# Patient Record
Sex: Female | Born: 1980
Health system: Southern US, Community
[De-identification: ages and names within clinical notes are randomized; demographics above are authoritative.]

## PROBLEM LIST (undated history)

## (undated) ENCOUNTER — Inpatient Hospital Stay (HOSPITAL_COMMUNITY): Payer: Self-pay

## (undated) DIAGNOSIS — IMO0002 Reserved for concepts with insufficient information to code with codable children: Secondary | ICD-10-CM

## (undated) DIAGNOSIS — F419 Anxiety disorder, unspecified: Secondary | ICD-10-CM

## (undated) DIAGNOSIS — M329 Systemic lupus erythematosus, unspecified: Secondary | ICD-10-CM

## (undated) DIAGNOSIS — Z9889 Other specified postprocedural states: Secondary | ICD-10-CM

## (undated) DIAGNOSIS — R112 Nausea with vomiting, unspecified: Secondary | ICD-10-CM

## (undated) DIAGNOSIS — R51 Headache: Secondary | ICD-10-CM

## (undated) HISTORY — DX: Headache: R51

## (undated) HISTORY — PX: KNEE ARTHROSCOPY: SUR90

## (undated) HISTORY — DX: Systemic lupus erythematosus, unspecified: M32.9

## (undated) HISTORY — DX: Anxiety disorder, unspecified: F41.9

## (undated) HISTORY — PX: MANDIBLE SURGERY: SHX707

## (undated) HISTORY — PX: KNEE ARTHROSCOPY W/ OATS PROCEDURE: SHX1880

## (undated) HISTORY — PX: WISDOM TOOTH EXTRACTION: SHX21

---

## 2005-09-09 ENCOUNTER — Emergency Department: Payer: Self-pay | Admitting: Emergency Medicine

## 2006-05-07 ENCOUNTER — Encounter (INDEPENDENT_AMBULATORY_CARE_PROVIDER_SITE_OTHER): Payer: Self-pay | Admitting: Specialist

## 2006-05-07 ENCOUNTER — Ambulatory Visit: Payer: Self-pay | Admitting: Obstetrics & Gynecology

## 2006-06-04 ENCOUNTER — Ambulatory Visit: Payer: Self-pay | Admitting: Obstetrics & Gynecology

## 2006-06-25 ENCOUNTER — Ambulatory Visit: Payer: Self-pay | Admitting: Orthopedic Surgery

## 2006-11-12 ENCOUNTER — Ambulatory Visit: Payer: Self-pay | Admitting: Obstetrics & Gynecology

## 2006-12-16 ENCOUNTER — Ambulatory Visit: Payer: Self-pay | Admitting: Orthopedic Surgery

## 2008-02-03 ENCOUNTER — Encounter: Payer: Self-pay | Admitting: Obstetrics & Gynecology

## 2008-02-03 ENCOUNTER — Ambulatory Visit: Payer: Self-pay | Admitting: Obstetrics & Gynecology

## 2008-02-03 LAB — CONVERTED CEMR LAB
Cholesterol: 176 mg/dL (ref 0–200)
HDL: 45 mg/dL (ref >39)
LDL Cholesterol: 117 mg/dL — ABNORMAL HIGH (ref 0–99)
Total CHOL/HDL Ratio: 3.9
Triglycerides: 70 mg/dL (ref <150)
VLDL: 14 mg/dL (ref 0–40)

## 2009-05-10 ENCOUNTER — Ambulatory Visit: Payer: Self-pay | Admitting: Obstetrics & Gynecology

## 2009-05-11 ENCOUNTER — Encounter: Payer: Self-pay | Admitting: Obstetrics & Gynecology

## 2009-05-11 ENCOUNTER — Ambulatory Visit: Payer: Self-pay | Admitting: Obstetrics and Gynecology

## 2009-05-11 LAB — CONVERTED CEMR LAB
Free T4: 1.17 ng/dL (ref 0.80–1.80)
T3, Free: 3.1 pg/mL (ref 2.3–4.2)
TSH: 4.742 microintl units/mL — ABNORMAL HIGH (ref 0.350–4.500)

## 2009-07-19 ENCOUNTER — Ambulatory Visit: Payer: Self-pay | Admitting: General Practice

## 2009-11-13 ENCOUNTER — Emergency Department: Payer: Self-pay | Admitting: Emergency Medicine

## 2010-07-31 NOTE — Assessment & Plan Note (Signed)
NAME:  DENVER, HARDER NO.:  [id number]   MEDICAL RECORD NO.:  [id number]          PATIENT TYPE:  POB   LOCATION:  [facility] at [facility]         FACILITY:  [facility]   PHYSICIAN:  Catalina Antigua, MD     DATE OF BIRTH:  1980/04/19   DATE OF SERVICE:  05/10/2009                                  CLINIC NOTE   This is a 30 year old, gravida 0 with LMP of April 30, 2009, who  presents for annual exam.  The patient is currently without any  complaints.  Denies abnormal bleeding or discharge or pelvic pain.  Currently in a monogamous relationship using Loestrin for birth control.   PAST MEDICAL HISTORY:  She denies.   PAST SURGICAL HISTORY:  Left knee surgery.   PAST OBSTETRICAL HISTORY:  She is nulliparous.   PAST GYNECOLOGIC HISTORY:  She denies any cysts, fibroids, or abnormal  Pap smears, or history of STDs.   FAMILY HISTORY:  Significant for breast cancer in the first cousin  diagnosed at the age of 50, otherwise denies any significant family  history.   SOCIAL HISTORY:  She denies drinking, smoking, or the use of illicit  drugs.   REVIEW OF SYSTEMS:  Otherwise within normal limits.   PHYSICAL EXAMINATION:  VITAL SIGNS:  Blood pressure of 133/90, pulse of  85, weight of 138 pounds, height of 5 feet 3 inches.  LUNGS:  Were clear to auscultation bilaterally.  HEART:  Regular rate and rhythm.  BREASTS:  Are equal in size.  No palpable masses.  No palpable  lymphadenopathy.  No expressible nipple discharge.  No skin dimpling.  ABDOMEN:  Soft, nontender, and nondistended.  PELVIC:  She had normal-appearing external genitalia, normal-appearing  vaginal mucosa and cervix.  No abnormal bleeding or discharge.  She has  small anteverted uterus.  No palpable adnexal masses or tenderness.   ASSESSMENT AND PLAN:  This is a 30 year old, gravida 0, LMP of  04/30/2009, who is here for annual exam.  Pap smear was performed.  The  patient declined STD testing at this time.   The patient will be  contacted with any abnormal results.  Prescription for Loestrin 24 was  provided.  The patient will return in a year or p.r.n.           ______________________________  Catalina Antigua, MD     PC/MEDQ  D:  05/10/2009  T:  05/10/2009  Job:  [id number]

## 2010-07-31 NOTE — Assessment & Plan Note (Signed)
NAME:  Stephanie [name], ADELSTEIN NO.:  [id number]   MEDICAL RECORD NO.:  [id number]          PATIENT TYPE:  POB   LOCATION:  [facility] at [facility]         FACILITY:  [facility]   PHYSICIAN:  Allie Bossier, MD        DATE OF BIRTH:  1980-08-09   DATE OF SERVICE:  02/03/2008                                  CLINIC NOTE   HISTORY OF PRESENT ILLNESS:  Ms. Elicker is a 30 year old single white  gravida 0 who is here for her annual exam.  She has no complaints, but  wishes a refill of her brand Loestrin OCPs.  She is currently separated  from her boyfriend and is not sexually active at the time that they are  trying to work things out.   PAST MEDICAL HISTORY:  None.   PAST SURGICAL HISTORY:  Left knee arthroscopy.   ALLERGIES:  No latex allergies.  No known drug allergies.   SOCIAL HISTORY:  Negative for tobacco, alcohol, or drug use.   FAMILY HISTORY:  Significant for breast cancer in a paternal first  cousin at the age of 83 years.  She denies a family history of GYN or  colon malignancies.   REVIEW OF SYSTEMS:  She has been monogamous for last 4 years, currently  abstinent.  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  She  denies dyspareunia.  She did say that at work they had a health  screening and her lipids were noted to be elevated.  I will recheck  those today with essentially fasting (she did have a little oatmeals  this morning).   PHYSICAL EXAMINATION:  VITAL SIGNS:  Weight 129 pounds, blood pressure  140/86, pulse 89, and height 5 feet 3 inches.  HEENT:  Normal.  BREAST:  Normal bilaterally.  ABDOMEN:  Scaphoid, no hepatosplenomegaly.  EXTERNAL GENITALIA:  Shaved, no lesions.  CERVIX:  Nulliparous, no lesions.  Normal discharge.  Adnexa nontender.  No masses.   ASSESSMENT AND PLAN:  1. Annual exam.  I will refill her Loestrin for a year.  2. Recommended self-breast exam and self-vulvar exam monthly.  3. I have checked the Pap smear as well.      Allie Bossier, MD     MCD/MEDQ  D:  02/03/2008  T:  02/03/2008  Job:  [id number]

## 2010-12-03 ENCOUNTER — Other Ambulatory Visit (INDEPENDENT_AMBULATORY_CARE_PROVIDER_SITE_OTHER): Payer: Self-pay | Admitting: *Deleted

## 2010-12-03 DIAGNOSIS — N912 Amenorrhea, unspecified: Secondary | ICD-10-CM

## 2010-12-03 NOTE — Progress Notes (Signed)
Patients last period was August 9 or 11. She is not sure.  She is always very regular at 31 day cycles and she had a negative home pregnancy test.  She would like HCG quant done.

## 2010-12-04 LAB — HCG, QUANTITATIVE, PREGNANCY: hCG, Beta Chain, Quant, S: 57023.6 m[IU]/mL

## 2010-12-06 ENCOUNTER — Encounter: Payer: Self-pay | Admitting: Gynecology

## 2010-12-06 ENCOUNTER — Ambulatory Visit: Payer: BC Managed Care – PPO | Admitting: Gynecology

## 2010-12-06 VITALS — BP 109/75 | Wt 136.0 lb

## 2010-12-06 DIAGNOSIS — Z3400 Encounter for supervision of normal first pregnancy, unspecified trimester: Secondary | ICD-10-CM

## 2010-12-07 LAB — OBSTETRIC PANEL
Antibody Screen: NEGATIVE
Basophils Absolute: 0 10*3/uL (ref 0.0–0.1)
Basophils Relative: 0 % (ref 0–1)
Eosinophils Absolute: 0 10*3/uL (ref 0.0–0.7)
Eosinophils Relative: 0 % (ref 0–5)
HCT: 34.9 % — ABNORMAL LOW (ref 36.0–46.0)
Hemoglobin: 12 g/dL (ref 12.0–15.0)
Hepatitis B Surface Ag: NEGATIVE
Lymphocytes Relative: 25 % (ref 12–46)
Lymphs Abs: 1.4 10*3/uL (ref 0.7–4.0)
MCH: 29.5 pg (ref 26.0–34.0)
MCHC: 34.4 g/dL (ref 30.0–36.0)
MCV: 85.7 fL (ref 78.0–100.0)
Monocytes Absolute: 0.4 10*3/uL (ref 0.1–1.0)
Monocytes Relative: 8 % (ref 3–12)
Neutro Abs: 3.7 10*3/uL (ref 1.7–7.7)
Neutrophils Relative %: 67 % (ref 43–77)
Platelets: 163 10*3/uL (ref 150–400)
RBC: 4.07 MIL/uL (ref 3.87–5.11)
RDW: 12.3 % (ref 11.5–15.5)
Rh Type: POSITIVE
Rubella: 34.3 IU/mL — ABNORMAL HIGH
WBC: 5.6 10*3/uL (ref 4.0–10.5)

## 2010-12-07 LAB — TSH: TSH: 3.589 u[IU]/mL (ref 0.350–4.500)

## 2010-12-07 LAB — HEPATITIS B CORE ANTIBODY, IGM: Hep B C IgM: NEGATIVE

## 2010-12-07 LAB — HIV ANTIBODY (ROUTINE TESTING W REFLEX): HIV: NONREACTIVE

## 2010-12-08 LAB — CULTURE, URINE COMPREHENSIVE
Colony Count: NO GROWTH
Organism ID, Bacteria: NO GROWTH

## 2010-12-17 ENCOUNTER — Ambulatory Visit: Payer: Self-pay | Admitting: Obstetrics & Gynecology

## 2010-12-20 ENCOUNTER — Ambulatory Visit (HOSPITAL_COMMUNITY)
Admission: RE | Admit: 2010-12-20 | Discharge: 2010-12-20 | Disposition: A | Discharge status: Home / Self Care | Payer: BC Managed Care – PPO | Source: Ambulatory Visit | Attending: Obstetrics & Gynecology | Admitting: Obstetrics & Gynecology

## 2010-12-20 DIAGNOSIS — Z3400 Encounter for supervision of normal first pregnancy, unspecified trimester: Secondary | ICD-10-CM

## 2010-12-20 DIAGNOSIS — Z3689 Encounter for other specified antenatal screening: Secondary | ICD-10-CM | POA: Insufficient documentation

## 2011-01-03 ENCOUNTER — Other Ambulatory Visit: Payer: Self-pay | Admitting: Obstetrics & Gynecology

## 2011-01-03 ENCOUNTER — Ambulatory Visit (INDEPENDENT_AMBULATORY_CARE_PROVIDER_SITE_OTHER): Payer: BC Managed Care – PPO | Admitting: Obstetrics & Gynecology

## 2011-01-03 ENCOUNTER — Encounter: Payer: Self-pay | Admitting: Obstetrics & Gynecology

## 2011-01-03 VITALS — BP 128/79 | Wt 136.0 lb

## 2011-01-03 DIAGNOSIS — Z3400 Encounter for supervision of normal first pregnancy, unspecified trimester: Secondary | ICD-10-CM

## 2011-01-03 DIAGNOSIS — Z3682 Encounter for antenatal screening for nuchal translucency: Secondary | ICD-10-CM

## 2011-01-03 DIAGNOSIS — Z1272 Encounter for screening for malignant neoplasm of vagina: Secondary | ICD-10-CM

## 2011-01-03 DIAGNOSIS — Z113 Encounter for screening for infections with a predominantly sexual mode of transmission: Secondary | ICD-10-CM

## 2011-01-03 NOTE — Progress Notes (Signed)
Flu vaccine not administered, she will receive when she follows up for her next visit.

## 2011-01-03 NOTE — Progress Notes (Signed)
We discussed recommended weight gain, flu shot, 1st trimester screen and all other pregnancy questions she had.  She denies any problems.

## 2011-01-18 ENCOUNTER — Ambulatory Visit (HOSPITAL_COMMUNITY)
Admission: RE | Admit: 2011-01-18 | Discharge: 2011-01-18 | Disposition: A | Discharge status: Home / Self Care | Payer: BC Managed Care – PPO | Source: Ambulatory Visit | Attending: Obstetrics & Gynecology | Admitting: Obstetrics & Gynecology

## 2011-01-18 ENCOUNTER — Other Ambulatory Visit: Payer: Self-pay

## 2011-01-18 DIAGNOSIS — Z3689 Encounter for other specified antenatal screening: Secondary | ICD-10-CM | POA: Insufficient documentation

## 2011-01-18 DIAGNOSIS — Z3682 Encounter for antenatal screening for nuchal translucency: Secondary | ICD-10-CM

## 2011-01-18 DIAGNOSIS — O351XX0 Maternal care for (suspected) chromosomal abnormality in fetus, not applicable or unspecified: Secondary | ICD-10-CM | POA: Insufficient documentation

## 2011-01-18 DIAGNOSIS — O3510X0 Maternal care for (suspected) chromosomal abnormality in fetus, unspecified, not applicable or unspecified: Secondary | ICD-10-CM | POA: Insufficient documentation

## 2011-01-31 ENCOUNTER — Ambulatory Visit (INDEPENDENT_AMBULATORY_CARE_PROVIDER_SITE_OTHER): Payer: BC Managed Care – PPO | Admitting: Family Medicine

## 2011-01-31 VITALS — BP 121/78 | Wt 137.0 lb

## 2011-01-31 DIAGNOSIS — O0990 Supervision of high risk pregnancy, unspecified, unspecified trimester: Secondary | ICD-10-CM | POA: Insufficient documentation

## 2011-01-31 DIAGNOSIS — Z23 Encounter for immunization: Secondary | ICD-10-CM

## 2011-01-31 DIAGNOSIS — Z3400 Encounter for supervision of normal first pregnancy, unspecified trimester: Secondary | ICD-10-CM

## 2011-01-31 MED ORDER — INFLUENZA VIRUS VACC SPLIT PF IM SUSP: 0.5000 mL | Freq: Once | INTRAMUSCULAR | Status: DC

## 2011-01-31 NOTE — Progress Notes (Signed)
Flu shot today, sched. anatomy

## 2011-01-31 NOTE — Patient Instructions (Signed)
Pregnancy - Second Trimester The second trimester of pregnancy (3 to 6 months) is a period of rapid growth for you and your baby. At the end of the sixth month, your baby is about 9 inches long and weighs 1 1/2 pounds. You will begin to feel the baby move between 18 and 20 weeks of the pregnancy. This is called quickening. Weight gain is faster. A clear fluid (colostrum) may leak out of your breasts. You may feel small contractions of the womb (uterus). This is known as false labor or Braxton-Hicks contractions. This is like a practice for labor when the baby is ready to be born. Usually, the problems with morning sickness have usually passed by the end of your first trimester. Some women develop small dark blotches (called cholasma, mask of pregnancy) on their face that usually goes away after the baby is born. Exposure to the sun makes the blotches worse. Acne may also develop in some pregnant women and pregnant women who have acne, may find that it goes away. PRENATAL EXAMS  Blood work may continue to be done during prenatal exams. These tests are done to check on your health and the probable health of your baby. Blood work is used to follow your blood levels (hemoglobin). Anemia (low hemoglobin) is common during pregnancy. Iron and vitamins are given to help prevent this. You will also be checked for diabetes between 24 and 28 weeks of the pregnancy. Some of the previous blood tests may be repeated.   The size of the uterus is measured during each visit. This is to make sure that the baby is continuing to grow properly according to the dates of the pregnancy.   Your blood pressure is checked every prenatal visit. This is to make sure you are not getting toxemia.   Your urine is checked to make sure you do not have an infection, diabetes or protein in the urine.   Your weight is checked often to make sure gains are happening at the suggested rate. This is to ensure that both you and your baby are  growing normally.   Sometimes, an ultrasound is performed to confirm the proper growth and development of the baby. This is a test which bounces harmless sound waves off the baby so your caregiver can more accurately determine due dates.  Sometimes, a specialized test is done on the amniotic fluid surrounding the baby. This test is called an amniocentesis. The amniotic fluid is obtained by sticking a needle into the belly (abdomen). This is done to check the chromosomes in instances where there is a concern about possible genetic problems with the baby. It is also sometimes done near the end of pregnancy if an early delivery is required. In this case, it is done to help make sure the baby's lungs are mature enough for the baby to live outside of the womb. CHANGES OCCURING IN THE SECOND TRIMESTER OF PREGNANCY Your body goes through many changes during pregnancy. They vary from person to person. Talk to your caregiver about changes you notice that you are concerned about.  During the second trimester, you will likely have an increase in your appetite. It is normal to have cravings for certain foods. This varies from person to person and pregnancy to pregnancy.   Your lower abdomen will begin to bulge.   You may have to urinate more often because the uterus and baby are pressing on your bladder. It is also common to get more bladder infections during pregnancy (  pain with urination). You can help this by drinking lots of fluids and emptying your bladder before and after intercourse.   You may begin to get stretch marks on your hips, abdomen, and breasts. These are normal changes in the body during pregnancy. There are no exercises or medications to take that prevent this change.   You may begin to develop swollen and bulging veins (varicose veins) in your legs. Wearing support hose, elevating your feet for 15 minutes, 3 to 4 times a day and limiting salt in your diet helps lessen the problem.    Heartburn may develop as the uterus grows and pushes up against the stomach. Antacids recommended by your caregiver helps with this problem. Also, eating smaller meals 4 to 5 times a day helps.   Constipation can be treated with a stool softener or adding bulk to your diet. Drinking lots of fluids, vegetables, fruits, and whole grains are helpful.   Exercising is also helpful. If you have been very active up until your pregnancy, most of these activities can be continued during your pregnancy. If you have been less active, it is helpful to start an exercise program such as walking.   Hemorrhoids (varicose veins in the rectum) may develop at the end of the second trimester. Warm sitz baths and hemorrhoid cream recommended by your caregiver helps hemorrhoid problems.   Backaches may develop during this time of your pregnancy. Avoid heavy lifting, wear low heal shoes and practice good posture to help with backache problems.   Some pregnant women develop tingling and numbness of their hand and fingers because of swelling and tightening of ligaments in the wrist (carpel tunnel syndrome). This goes away after the baby is born.   As your breasts enlarge, you may have to get a bigger bra. Get a comfortable, cotton, support bra. Do not get a nursing bra until the last month of the pregnancy if you will be nursing the baby.   You may get a dark line from your belly button to the pubic area called the linea nigra.   You may develop rosy cheeks because of increase blood flow to the face.   You may develop spider looking lines of the face, neck, arms and chest. These go away after the baby is born.  HOME CARE INSTRUCTIONS   It is extremely important to avoid all smoking, herbs, alcohol, and unprescribed drugs during your pregnancy. These chemicals affect the formation and growth of the baby. Avoid these chemicals throughout the pregnancy to ensure the delivery of a healthy infant.   Most of your home  care instructions are the same as suggested for the first trimester of your pregnancy. Keep your caregiver's appointments. Follow your caregiver's instructions regarding medication use, exercise and diet.   During pregnancy, you are providing food for you and your baby. Continue to eat regular, well-balanced meals. Choose foods such as meat, fish, milk and other low fat dairy products, vegetables, fruits, and whole-grain breads and cereals. Your caregiver will tell you of the ideal weight gain.   A physical sexual relationship may be continued up until near the end of pregnancy if there are no other problems. Problems could include early (premature) leaking of amniotic fluid from the membranes, vaginal bleeding, abdominal pain, or other medical or pregnancy problems.   Exercise regularly if there are no restrictions. Check with your caregiver if you are unsure of the safety of some of your exercises. The greatest weight gain will occur in the   last 2 trimesters of pregnancy. Exercise will help you:   Control your weight.   Get you in shape for labor and delivery.   Lose weight after you have the baby.   Wear a good support or jogging bra for breast tenderness during pregnancy. This may help if worn during sleep. Pads or tissues may be used in the bra if you are leaking colostrum.   Do not use hot tubs, steam rooms or saunas throughout the pregnancy.   Wear your seat belt at all times when driving. This protects you and your baby if you are in an accident.   Avoid raw meat, uncooked cheese, cat litter boxes and soil used by cats. These carry germs that can cause birth defects in the baby.   The second trimester is also a good time to visit your dentist for your dental health if this has not been done yet. Getting your teeth cleaned is OK. Use a soft toothbrush. Brush gently during pregnancy.   It is easier to loose urine during pregnancy. Tightening up and strengthening the pelvic muscles will  help with this problem. Practice stopping your urination while you are going to the bathroom. These are the same muscles you need to strengthen. It is also the muscles you would use as if you were trying to stop from passing gas. You can practice tightening these muscles up 10 times a set and repeating this about 3 times per day. Once you know what muscles to tighten up, do not perform these exercises during urination. It is more likely to contribute to an infection by backing up the urine.   Ask for help if you have financial, counseling or nutritional needs during pregnancy. Your caregiver will be able to offer counseling for these needs as well as refer you for other special needs.   Your skin may become oily. If so, wash your face with mild soap, use non-greasy moisturizer and oil or cream based makeup.  MEDICATIONS AND DRUG USE IN PREGNANCY  Take prenatal vitamins as directed. The vitamin should contain 1 milligram of folic acid. Keep all vitamins out of reach of children. Only a couple vitamins or tablets containing iron may be fatal to a baby or young child when ingested.   Avoid use of all medications, including herbs, over-the-counter medications, not prescribed or suggested by your caregiver. Only take over-the-counter or prescription medicines for pain, discomfort, or fever as directed by your caregiver. Do not use aspirin.   Let your caregiver also know about herbs you may be using.   Alcohol is related to a number of birth defects. This includes fetal alcohol syndrome. All alcohol, in any form, should be avoided completely. Smoking will cause low birth rate and premature babies.   Street or illegal drugs are very harmful to the baby. They are absolutely forbidden. A baby born to an addicted mother will be addicted at birth. The baby will go through the same withdrawal an adult does.  SEEK MEDICAL CARE IF:  You have any concerns or worries during your pregnancy. It is better to call with  your questions if you feel they cannot wait, rather than worry about them. SEEK IMMEDIATE MEDICAL CARE IF:   An unexplained oral temperature above 102 F (38.9 C) develops, or as your caregiver suggests.   You have leaking of fluid from the vagina (birth canal). If leaking membranes are suspected, take your temperature and tell your caregiver of this when you call.   There   is vaginal spotting, bleeding, or passing clots. Tell your caregiver of the amount and how many pads are used. Light spotting in pregnancy is common, especially following intercourse.   You develop a bad smelling vaginal discharge with a change in the color from clear to white.   You continue to feel sick to your stomach (nauseated) and have no relief from remedies suggested. You vomit blood or coffee ground-like materials.   You lose more than 2 pounds of weight or gain more than 2 pounds of weight over 1 week, or as suggested by your caregiver.   You notice swelling of your face, hands, feet, or legs.   You get exposed to German measles and have never had them.   You are exposed to fifth disease or chickenpox.   You develop belly (abdominal) pain. Round ligament discomfort is a common non-cancerous (benign) cause of abdominal pain in pregnancy. Your caregiver still must evaluate you.   You develop a bad headache that does not go away.   You develop fever, diarrhea, pain with urination, or shortness of breath.   You develop visual problems, blurry, or double vision.   You fall or are in a car accident or any kind of trauma.   There is mental or physical violence at home.  Document Released: 02/26/2001 Document Revised: 11/14/2010 Document Reviewed: 08/31/2008 Exit[employer] Patient Information 2012 [employer]. 

## 2011-02-27 ENCOUNTER — Ambulatory Visit (INDEPENDENT_AMBULATORY_CARE_PROVIDER_SITE_OTHER): Payer: BC Managed Care – PPO | Admitting: Obstetrics & Gynecology

## 2011-02-27 VITALS — BP 115/76 | Temp 97.8°F | Wt 142.0 lb

## 2011-02-27 DIAGNOSIS — Z3400 Encounter for supervision of normal first pregnancy, unspecified trimester: Secondary | ICD-10-CM

## 2011-02-27 NOTE — Progress Notes (Signed)
Coughing a little, no fever.  Had exposure to colleagues with confirmed flu infection; but she did receive the vaccination.  She was told to call back if her symptoms worsen or if she develops a fever as she may need testing and Tamiflu. MSAFP to be drawn today.  Anatomy scan on 02/28/11.  No other complaints or concerns.  Labor precautions reviewed.   Return to clinic in 4 weeks or earlier if needed.

## 2011-02-27 NOTE — Patient Instructions (Signed)
Breastfeeding BENEFITS OF BREASTFEEDING For the baby  The first milk (colostrum) helps the baby's digestive system function better.   There are antibodies from the mother in the milk that help the baby fight off infections.   The baby has a lower incidence of asthma, allergies, and SIDS (sudden infant death syndrome).   The nutrients in breast milk are better than formulas for the baby and helps the baby's brain grow better.   Babies who breastfeed have less gas, colic, and constipation.  For the mother  Breastfeeding helps develop a very special bond between mother and baby.   It is more convenient, always available at the correct temperature and cheaper than formula feeding.   It burns calories in the mother and helps with losing weight that was gained during pregnancy.   It makes the uterus contract back down to normal size faster and slows bleeding following delivery.   Breastfeeding mothers have a lower risk of developing breast cancer.  NURSE FREQUENTLY  A healthy, full-term baby may breastfeed as often as every hour or space his or her feedings to every 3 hours.   How often to nurse will vary from baby to baby. Watch your baby for signs of hunger, not the clock.   Nurse as often as the baby requests, or when you feel the need to reduce the fullness of your breasts.   Awaken the baby if it has been 3 to 4 hours since the last feeding.   Frequent feeding will help the mother make more milk and will prevent problems like sore nipples and engorgement of the breasts.  BABY'S POSITION AT THE BREAST  Whether lying down or sitting, be sure that the baby's tummy is facing your tummy.   Support the breast with 4 fingers underneath the breast and the thumb above. Make sure your fingers are well away from the nipple and baby's mouth.   Stroke the baby's lips and cheek closest to the breast gently with your finger or nipple.   When the baby's mouth is open wide enough, place  all of your nipple and as much of the dark area around the nipple as possible into your baby's mouth.   Pull the baby in close so the tip of the nose and the baby's cheeks touch the breast during the feeding.  FEEDINGS  The length of each feeding varies from baby to baby and from feeding to feeding.   The baby must suck about 2 to 3 minutes for your milk to get to him or her. This is called a "let down." For this reason, allow the baby to feed on each breast as long as he or she wants. Your baby will end the feeding when he or she has received the right balance of nutrients.   To break the suction, put your finger into the corner of the baby's mouth and slide it between his or her gums before removing your breast from his or her mouth. This will help prevent sore nipples.  REDUCING BREAST ENGORGEMENT  In the first week after your baby is born, you may experience signs of breast engorgement. When breasts are engorged, they feel heavy, warm, full, and may be tender to the touch. You can reduce engorgement if you:   Nurse frequently, every 2 to 3 hours. Mothers who breastfeed early and often have fewer problems with engorgement.   Place light ice packs on your breasts between feedings. This reduces swelling. Wrap the ice packs in a   lightweight towel to protect your skin.   Apply moist hot packs to your breast for 5 to 10 minutes before each feeding. This increases circulation and helps the milk flow.   Gently massage your breast before and during the feeding.   Make sure that the baby empties at least one breast at every feeding before switching sides.   Use a breast pump to empty the breasts if your baby is sleepy or not nursing well. You may also want to pump if you are returning to work or or you feel you are getting engorged.   Avoid bottle feeds, pacifiers or supplemental feedings of water or juice in place of breastfeeding.   Be sure the baby is latched on and positioned properly while  breastfeeding.   Prevent fatigue, stress, and anemia.   Wear a supportive bra, avoiding underwire styles.   Eat a balanced diet with enough fluids.  If you follow these suggestions, your engorgement should improve in 24 to 48 hours. If you are still experiencing difficulty, call your lactation consultant or caregiver. IS MY BABY GETTING ENOUGH MILK? Sometimes, mothers worry about whether their babies are getting enough milk. You can be assured that your baby is getting enough milk if:  The baby is actively sucking and you hear swallowing.   The baby nurses at least 8 to 12 times in a 24 hour time period. Nurse your baby until he or she unlatches or falls asleep at the first breast (at least 10 to 20 minutes), then offer the second side.   The baby is wetting 5 to 6 disposable diapers (6 to 8 cloth diapers) in a 24 hour period by 5 to 6 days of age.   The baby is having at least 2 to 3 stools every 24 hours for the first few months. Breast milk is all the food your baby needs. It is not necessary for your baby to have water or formula. In fact, to help your breasts make more milk, it is best not to give your baby supplemental feedings during the early weeks.   The stool should be soft and yellow.   The baby should gain 4 to 7 ounces per week after he is 4 days old.  TAKE CARE OF YOURSELF Take care of your breasts by:  Bathing or showering daily.   Avoiding the use of soaps on your nipples.   Start feedings on your left breast at one feeding and on your right breast at the next feeding.   You will notice an increase in your milk supply 2 to 5 days after delivery. You may feel some discomfort from engorgement, which makes your breasts very firm and often tender. Engorgement "peaks" out within 24 to 48 hours. In the meantime, apply warm moist towels to your breasts for 5 to 10 minutes before feeding. Gentle massage and expression of some milk before feeding will soften your breasts, making  it easier for your baby to latch on. Wear a well fitting nursing bra and air dry your nipples for 10 to 15 minutes after each feeding.   Only use cotton bra pads.   Only use pure lanolin on your nipples after nursing. You do not need to wash it off before nursing.  Take care of yourself by:   Eating well-balanced meals and nutritious snacks.   Drinking milk, fruit juice, and water to satisfy your thirst (about 8 glasses a day).   Getting plenty of rest.   Increasing calcium in   your diet (1200 mg a day).   Avoiding foods that you notice affect the baby in a bad way.  SEEK MEDICAL CARE IF:   You have any questions or difficulty with breastfeeding.   You need help.   You have a hard, red, sore area on your breast, accompanied by a fever of 100.5 F (38.1 C) or more.   Your baby is too sleepy to eat well or is having trouble sleeping.   Your baby is wetting less than 6 diapers per day, by 5 days of age.   Your baby's skin or white part of his or her eyes is more yellow than it was in the hospital.   You feel depressed.  Document Released: 03/04/2005 Document Revised: 11/14/2010 Document Reviewed: 10/17/2008 [employer] Patient Information 2012 [employer]. 

## 2011-02-28 ENCOUNTER — Ambulatory Visit (HOSPITAL_COMMUNITY)
Admission: RE | Admit: 2011-02-28 | Discharge: 2011-02-28 | Disposition: A | Discharge status: Home / Self Care | Payer: BC Managed Care – PPO | Source: Ambulatory Visit | Attending: Family Medicine | Admitting: Family Medicine

## 2011-02-28 ENCOUNTER — Telehealth: Payer: Self-pay | Admitting: *Deleted

## 2011-02-28 ENCOUNTER — Encounter: Payer: Self-pay | Admitting: Family Medicine

## 2011-02-28 DIAGNOSIS — Z1389 Encounter for screening for other disorder: Secondary | ICD-10-CM | POA: Insufficient documentation

## 2011-02-28 DIAGNOSIS — O358XX0 Maternal care for other (suspected) fetal abnormality and damage, not applicable or unspecified: Secondary | ICD-10-CM | POA: Insufficient documentation

## 2011-02-28 DIAGNOSIS — Z3400 Encounter for supervision of normal first pregnancy, unspecified trimester: Secondary | ICD-10-CM

## 2011-02-28 DIAGNOSIS — Z363 Encounter for antenatal screening for malformations: Secondary | ICD-10-CM | POA: Insufficient documentation

## 2011-02-28 DIAGNOSIS — R935 Abnormal findings on diagnostic imaging of other abdominal regions, including retroperitoneum: Secondary | ICD-10-CM

## 2011-02-28 LAB — ALPHA FETOPROTEIN, MATERNAL
AFP: 28.9 IU/mL
MoM for AFP: 0.63
Open Spina bifida: NEGATIVE
Osb Risk: <1:27300 {titer}

## 2011-02-28 NOTE — Telephone Encounter (Signed)
Patient had ultrasound at radiology today and was told her baby had a soft marker for downs in her heart.  She would like to move forward with mfm consult.  She is considering ultrasound.

## 2011-03-05 ENCOUNTER — Ambulatory Visit (HOSPITAL_COMMUNITY)
Admission: RE | Admit: 2011-03-05 | Discharge: 2011-03-05 | Disposition: A | Discharge status: Home / Self Care | Payer: BC Managed Care – PPO | Source: Ambulatory Visit | Attending: Family Medicine | Admitting: Family Medicine

## 2011-03-05 ENCOUNTER — Other Ambulatory Visit: Payer: Self-pay | Admitting: Obstetrics & Gynecology

## 2011-03-05 ENCOUNTER — Ambulatory Visit (HOSPITAL_COMMUNITY)
Admission: RE | Admit: 2011-03-05 | Discharge: 2011-03-05 | Disposition: A | Discharge status: Home / Self Care | Payer: BC Managed Care – PPO | Source: Ambulatory Visit | Attending: Obstetrics & Gynecology | Admitting: Obstetrics & Gynecology

## 2011-03-05 DIAGNOSIS — O358XX0 Maternal care for other (suspected) fetal abnormality and damage, not applicable or unspecified: Secondary | ICD-10-CM

## 2011-03-05 DIAGNOSIS — Z1389 Encounter for screening for other disorder: Secondary | ICD-10-CM | POA: Insufficient documentation

## 2011-03-05 DIAGNOSIS — Z363 Encounter for antenatal screening for malformations: Secondary | ICD-10-CM | POA: Insufficient documentation

## 2011-03-06 ENCOUNTER — Encounter: Payer: Self-pay | Admitting: Obstetrics & Gynecology

## 2011-03-19 NOTE — L&D Delivery Note (Signed)
Delivery Note At 9:22 AM a viable female was delivered via Vaginal, Spontaneous Delivery (Presentation: ;OP  ).  APGAR: 9, 10; weight .  8.1 Placenta status: Intact, Spontaneous.  Cord: 3 vessels with the following complications PPH 600cc  Anesthesia: Epidural  Episiotomy: None Lacerations: 2nd degree perineal, bilateral sulcus tears; repaired by Dr. Penne Lash Suture Repair: 3.0 vicryl rapide Est. Blood Loss (mL): 600  Mom to postpartum.  Baby to nursery-stable.  CRESENZO-DISHMAN,Aki Burdin 07/25/2011, 10:15 AM

## 2011-03-20 ENCOUNTER — Ambulatory Visit (INDEPENDENT_AMBULATORY_CARE_PROVIDER_SITE_OTHER): Payer: BC Managed Care – PPO | Admitting: Family Medicine

## 2011-03-20 ENCOUNTER — Other Ambulatory Visit: Payer: Self-pay

## 2011-03-20 ENCOUNTER — Inpatient Hospital Stay (HOSPITAL_COMMUNITY)
Admission: AD | Admit: 2011-03-20 | Discharge: 2011-03-20 | Disposition: A | Discharge status: Home / Self Care | Payer: BC Managed Care – PPO | Source: Ambulatory Visit | Attending: Obstetrics and Gynecology | Admitting: Obstetrics and Gynecology

## 2011-03-20 ENCOUNTER — Encounter (HOSPITAL_COMMUNITY): Payer: Self-pay | Admitting: *Deleted

## 2011-03-20 DIAGNOSIS — R42 Dizziness and giddiness: Secondary | ICD-10-CM | POA: Insufficient documentation

## 2011-03-20 DIAGNOSIS — O99891 Other specified diseases and conditions complicating pregnancy: Secondary | ICD-10-CM | POA: Insufficient documentation

## 2011-03-20 DIAGNOSIS — Z3400 Encounter for supervision of normal first pregnancy, unspecified trimester: Secondary | ICD-10-CM

## 2011-03-20 DIAGNOSIS — R55 Syncope and collapse: Secondary | ICD-10-CM

## 2011-03-20 LAB — COMPREHENSIVE METABOLIC PANEL
ALT: 10 U/L (ref 0–35)
AST: 15 U/L (ref 0–37)
Albumin: 2.8 g/dL — ABNORMAL LOW (ref 3.5–5.2)
Alkaline Phosphatase: 51 U/L (ref 39–117)
BUN: 8 mg/dL (ref 6–23)
CO2: 25 mEq/L (ref 19–32)
Calcium: 8.6 mg/dL (ref 8.4–10.5)
Chloride: 104 mEq/L (ref 96–112)
Creatinine, Ser: 0.51 mg/dL (ref 0.50–1.10)
GFR calc Af Amer: >90 mL/min (ref >90)
GFR calc non Af Amer: >90 mL/min (ref >90)
Glucose, Bld: 95 mg/dL (ref 70–99)
Potassium: 3.7 mEq/L (ref 3.5–5.1)
Sodium: 136 mEq/L (ref 135–145)
Total Bilirubin: 0.2 mg/dL — ABNORMAL LOW (ref 0.3–1.2)
Total Protein: 6.5 g/dL (ref 6.0–8.3)

## 2011-03-20 LAB — URINALYSIS, ROUTINE W REFLEX MICROSCOPIC
Bilirubin Urine: NEGATIVE
Glucose, UA: NEGATIVE mg/dL
Hgb urine dipstick: NEGATIVE
Ketones, ur: NEGATIVE mg/dL
Leukocytes, UA: NEGATIVE
Nitrite: NEGATIVE
Protein, ur: NEGATIVE mg/dL
Specific Gravity, Urine: 1.01 (ref 1.005–1.030)
Urobilinogen, UA: 0.2 mg/dL (ref 0.0–1.0)
pH: 6.5 (ref 5.0–8.0)

## 2011-03-20 LAB — CBC
HCT: 34.4 % — ABNORMAL LOW (ref 36.0–46.0)
Hemoglobin: 12.3 g/dL (ref 12.0–15.0)
MCH: 31.6 pg (ref 26.0–34.0)
MCHC: 35.8 g/dL (ref 30.0–36.0)
MCV: 88.4 fL (ref 78.0–100.0)
Platelets: 162 10*3/uL (ref 150–400)
RBC: 3.89 MIL/uL (ref 3.87–5.11)
RDW: 13.3 % (ref 11.5–15.5)
WBC: 7.4 10*3/uL (ref 4.0–10.5)

## 2011-03-20 NOTE — Patient Instructions (Signed)
Syncope You have had a fainting (syncopal) spell. A fainting episode is a sudden, short-lived loss of consciousness. It results in complete recovery. It occurs because there has been a temporary shortage of oxygen and/or sugar (glucose) to the brain. CAUSES   Blood pressure pills and other medications that may lower blood pressure below normal. Sudden changes in posture (sudden standing).   Over-medication. Take your medications as directed.   Standing too long. This can cause blood to pool in the legs.   Seizure disorders.   Low blood sugar (hypoglycemia) of diabetes. This more commonly causes coma.   Bearing down to go to the bathroom. This can cause your blood pressure to rise suddenly. Your body compensates by making the blood pressure too low when you stop bearing down.   Hardening of the arteries where the brain temporarily does not receive enough blood.   Irregular heart beat and circulatory problems.   Fear, emotional distress, injury, sight of blood, or illness.  Your caregiver will send you home if the syncope was from non-worrisome causes (benign). Depending on your age and health, you may stay to be monitored and observed. If you return home, have someone stay with you if your caregiver feels that is desirable. It is very important to keep all follow-up referrals and appointments in order to properly manage this condition. This is a serious problem which can lead to serious illness and death if not carefully managed.  WARNING: Do not drive or operate machinery until your caregiver feels that it is safe for you to do so. SEEK IMMEDIATE MEDICAL CARE IF:   You have another fainting episode or faint while lying or sitting down. DO NOT DRIVE YOURSELF. Call 911 if no other help is available.   You have chest pain, are feeling sick to your stomach (nausea), vomiting or abdominal pain.   You have an irregular heartbeat or one that is very fast (pulse over 120 beats per minute).    You have a loss of feeling in some part of your body or lose movement in your arms or legs.   You have difficulty with speech, confusion, severe weakness, or visual problems.   You become sweaty and/or feel light headed.  Make sure you are rechecked as instructed. Document Released: 03/04/2005 Document Revised: 11/14/2010 Document Reviewed: 10/23/2006 [facility] Patient Information 2012 [place], [place].

## 2011-03-20 NOTE — ED Provider Notes (Signed)
Pt 31 y/o G1P0 21.5 weeks that come today to the MAU with the complaint of lightheadedness. She denies nausea, vomiting, fever or pain. No upper respiratory illness. Had an episode of bronchitis 3 weeks ago that resolved completely. She was concern about baby's health. No bleeding, no vaginal discharge, baby moving. Physical exam. General: cooperative. NAD HEENT: moist mucosa no oropharyngeal erythema. Resp: Clear breath sounds. No rales or wheezes. Card: RRR no murmurs. OB exam: fetal heart rate 146. Fetal mov present. No edema. No discharge. Assessment and Plan: 31 y/o F G1 P0 21.5 w with lightheadedness of unclear etiology Negative physical exam findings. Labs WNL, EKG with normal sinus rhythm. Pt's mother mentions that Ms. May drinks mostly sodas. There is a component of anxiety about the baby. Discussed with pt and agreeable to drink plenty of fluids. We recommend her  to get evaluated if symptoms persist.

## 2011-03-20 NOTE — Progress Notes (Signed)
Near syncope--unclear etiology--not worse with standing, no vertigo, HR is up mildly--will send to MAU for EKG and Labs.

## 2011-03-20 NOTE — Progress Notes (Signed)
Was seen at [facility] today for c/o syncope, sent to MAU, EKG in progress.

## 2011-03-20 NOTE — ED Provider Notes (Signed)
Seen with resident. Agree with assesment and plan

## 2011-03-20 NOTE — ED Provider Notes (Signed)
Agree with above note.  Jozelyn Kuwahara 03/20/2011 4:05 PM

## 2011-03-20 NOTE — Progress Notes (Signed)
Patient has been feeling very light headed and week.  This has been going on but not lasting very long, but today it has been all morning.  She has been eating and drinking well.

## 2011-03-20 NOTE — Progress Notes (Signed)
Patient states she woke up this am feeling lightheaded and weak. Went to the office and was sent to MAU for further evaluation. Patient states she has eaten and drank. The lightheadedness and weak over the whole body continues. Denies pain, bleeding or leaking and reports fetal movement.

## 2011-03-20 NOTE — ED Provider Notes (Signed)
Seen by me. Agree with above.

## 2011-03-21 ENCOUNTER — Telehealth (HOSPITAL_COMMUNITY): Payer: Self-pay | Admitting: MS"

## 2011-03-21 NOTE — Progress Notes (Signed)
BP lying 124/85 BP sitting 121/86 BP standing 121/96

## 2011-03-21 NOTE — Telephone Encounter (Signed)
Called Stephanie [name] May to discuss result of noninvasive prenatal testing ([facility]), which uses cell free fetal DNA found in the maternal circulation. These results are low risk for the conditions screened: less than 1 in 10,000 for trisomy 13, trisomy 3, and trisomy 20.   This test is not diagnostic for chromosome conditions, but can provide information regarding the presence or absence of extra fetal DNA for chromosomes 13, 18 and 21. The reported detection rate is greater than 99% for Trisomy 21, greater than 97% for Trisomy 18, and is approximately 80% (8 out of 10) for Trisomy 13. The false positive rate is thought to be less than 1% for any of these conditions.

## 2011-03-26 ENCOUNTER — Ambulatory Visit (INDEPENDENT_AMBULATORY_CARE_PROVIDER_SITE_OTHER): Payer: BC Managed Care – PPO | Admitting: Family Medicine

## 2011-03-26 DIAGNOSIS — Z3480 Encounter for supervision of other normal pregnancy, unspecified trimester: Secondary | ICD-10-CM

## 2011-03-26 NOTE — Progress Notes (Signed)
Went to [facility] last week for near syncope--work up was WNL. Feels better and is increasing hydration.

## 2011-03-26 NOTE — Patient Instructions (Signed)
Pregnancy - Second Trimester The second trimester of pregnancy (3 to 6 months) is a period of rapid growth for you and your baby. At the end of the sixth month, your baby is about 9 inches long and weighs 1 1/2 pounds. You will begin to feel the baby move between 18 and 20 weeks of the pregnancy. This is called quickening. Weight gain is faster. A clear fluid (colostrum) may leak out of your breasts. You may feel small contractions of the womb (uterus). This is known as false labor or Braxton-Hicks contractions. This is like a practice for labor when the baby is ready to be born. Usually, the problems with morning sickness have usually passed by the end of your first trimester. Some women develop small dark blotches (called cholasma, mask of pregnancy) on their face that usually goes away after the baby is born. Exposure to the sun makes the blotches worse. Acne may also develop in some pregnant women and pregnant women who have acne, may find that it goes away. PRENATAL EXAMS  Blood work may continue to be done during prenatal exams. These tests are done to check on your health and the probable health of your baby. Blood work is used to follow your blood levels (hemoglobin). Anemia (low hemoglobin) is common during pregnancy. Iron and vitamins are given to help prevent this. You will also be checked for diabetes between 24 and 28 weeks of the pregnancy. Some of the previous blood tests may be repeated.   The size of the uterus is measured during each visit. This is to make sure that the baby is continuing to grow properly according to the dates of the pregnancy.   Your blood pressure is checked every prenatal visit. This is to make sure you are not getting toxemia.   Your urine is checked to make sure you do not have an infection, diabetes or protein in the urine.   Your weight is checked often to make sure gains are happening at the suggested rate. This is to ensure that both you and your baby are  growing normally.   Sometimes, an ultrasound is performed to confirm the proper growth and development of the baby. This is a test which bounces harmless sound waves off the baby so your caregiver can more accurately determine due dates.  Sometimes, a specialized test is done on the amniotic fluid surrounding the baby. This test is called an amniocentesis. The amniotic fluid is obtained by sticking a needle into the belly (abdomen). This is done to check the chromosomes in instances where there is a concern about possible genetic problems with the baby. It is also sometimes done near the end of pregnancy if an early delivery is required. In this case, it is done to help make sure the baby's lungs are mature enough for the baby to live outside of the womb. CHANGES OCCURING IN THE SECOND TRIMESTER OF PREGNANCY Your body goes through many changes during pregnancy. They vary from person to person. Talk to your caregiver about changes you notice that you are concerned about.  During the second trimester, you will likely have an increase in your appetite. It is normal to have cravings for certain foods. This varies from person to person and pregnancy to pregnancy.   Your lower abdomen will begin to bulge.   You may have to urinate more often because the uterus and baby are pressing on your bladder. It is also common to get more bladder infections during pregnancy (  pain with urination). You can help this by drinking lots of fluids and emptying your bladder before and after intercourse.   You may begin to get stretch marks on your hips, abdomen, and breasts. These are normal changes in the body during pregnancy. There are no exercises or medications to take that prevent this change.   You may begin to develop swollen and bulging veins (varicose veins) in your legs. Wearing support hose, elevating your feet for 15 minutes, 3 to 4 times a day and limiting salt in your diet helps lessen the problem.    Heartburn may develop as the uterus grows and pushes up against the stomach. Antacids recommended by your caregiver helps with this problem. Also, eating smaller meals 4 to 5 times a day helps.   Constipation can be treated with a stool softener or adding bulk to your diet. Drinking lots of fluids, vegetables, fruits, and whole grains are helpful.   Exercising is also helpful. If you have been very active up until your pregnancy, most of these activities can be continued during your pregnancy. If you have been less active, it is helpful to start an exercise program such as walking.   Hemorrhoids (varicose veins in the rectum) may develop at the end of the second trimester. Warm sitz baths and hemorrhoid cream recommended by your caregiver helps hemorrhoid problems.   Backaches may develop during this time of your pregnancy. Avoid heavy lifting, wear low heal shoes and practice good posture to help with backache problems.   Some pregnant women develop tingling and numbness of their hand and fingers because of swelling and tightening of ligaments in the wrist (carpel tunnel syndrome). This goes away after the baby is born.   As your breasts enlarge, you may have to get a bigger bra. Get a comfortable, cotton, support bra. Do not get a nursing bra until the last month of the pregnancy if you will be nursing the baby.   You may get a dark line from your belly button to the pubic area called the linea nigra.   You may develop rosy cheeks because of increase blood flow to the face.   You may develop spider looking lines of the face, neck, arms and chest. These go away after the baby is born.  HOME CARE INSTRUCTIONS   It is extremely important to avoid all smoking, herbs, alcohol, and unprescribed drugs during your pregnancy. These chemicals affect the formation and growth of the baby. Avoid these chemicals throughout the pregnancy to ensure the delivery of a healthy infant.   Most of your home  care instructions are the same as suggested for the first trimester of your pregnancy. Keep your caregiver's appointments. Follow your caregiver's instructions regarding medication use, exercise and diet.   During pregnancy, you are providing food for you and your baby. Continue to eat regular, well-balanced meals. Choose foods such as meat, fish, milk and other low fat dairy products, vegetables, fruits, and whole-grain breads and cereals. Your caregiver will tell you of the ideal weight gain.   A physical sexual relationship may be continued up until near the end of pregnancy if there are no other problems. Problems could include early (premature) leaking of amniotic fluid from the membranes, vaginal bleeding, abdominal pain, or other medical or pregnancy problems.   Exercise regularly if there are no restrictions. Check with your caregiver if you are unsure of the safety of some of your exercises. The greatest weight gain will occur in the   last 2 trimesters of pregnancy. Exercise will help you:   Control your weight.   Get you in shape for labor and delivery.   Lose weight after you have the baby.   Wear a good support or jogging bra for breast tenderness during pregnancy. This may help if worn during sleep. Pads or tissues may be used in the bra if you are leaking colostrum.   Do not use hot tubs, steam rooms or saunas throughout the pregnancy.   Wear your seat belt at all times when driving. This protects you and your baby if you are in an accident.   Avoid raw meat, uncooked cheese, cat litter boxes and soil used by cats. These carry germs that can cause birth defects in the baby.   The second trimester is also a good time to visit your dentist for your dental health if this has not been done yet. Getting your teeth cleaned is OK. Use a soft toothbrush. Brush gently during pregnancy.   It is easier to loose urine during pregnancy. Tightening up and strengthening the pelvic muscles will  help with this problem. Practice stopping your urination while you are going to the bathroom. These are the same muscles you need to strengthen. It is also the muscles you would use as if you were trying to stop from passing gas. You can practice tightening these muscles up 10 times a set and repeating this about 3 times per day. Once you know what muscles to tighten up, do not perform these exercises during urination. It is more likely to contribute to an infection by backing up the urine.   Ask for help if you have financial, counseling or nutritional needs during pregnancy. Your caregiver will be able to offer counseling for these needs as well as refer you for other special needs.   Your skin may become oily. If so, wash your face with mild soap, use non-greasy moisturizer and oil or cream based makeup.  MEDICATIONS AND DRUG USE IN PREGNANCY  Take prenatal vitamins as directed. The vitamin should contain 1 milligram of folic acid. Keep all vitamins out of reach of children. Only a couple vitamins or tablets containing iron may be fatal to a baby or young child when ingested.   Avoid use of all medications, including herbs, over-the-counter medications, not prescribed or suggested by your caregiver. Only take over-the-counter or prescription medicines for pain, discomfort, or fever as directed by your caregiver. Do not use aspirin.   Let your caregiver also know about herbs you may be using.   Alcohol is related to a number of birth defects. This includes fetal alcohol syndrome. All alcohol, in any form, should be avoided completely. Smoking will cause low birth rate and premature babies.   Street or illegal drugs are very harmful to the baby. They are absolutely forbidden. A baby born to an addicted mother will be addicted at birth. The baby will go through the same withdrawal an adult does.  SEEK MEDICAL CARE IF:  You have any concerns or worries during your pregnancy. It is better to call with  your questions if you feel they cannot wait, rather than worry about them. SEEK IMMEDIATE MEDICAL CARE IF:   An unexplained oral temperature above 102 F (38.9 C) develops, or as your caregiver suggests.   You have leaking of fluid from the vagina (birth canal). If leaking membranes are suspected, take your temperature and tell your caregiver of this when you call.   There   is vaginal spotting, bleeding, or passing clots. Tell your caregiver of the amount and how many pads are used. Light spotting in pregnancy is common, especially following intercourse.   You develop a bad smelling vaginal discharge with a change in the color from clear to white.   You continue to feel sick to your stomach (nauseated) and have no relief from remedies suggested. You vomit blood or coffee ground-like materials.   You lose more than 2 pounds of weight or gain more than 2 pounds of weight over 1 week, or as suggested by your caregiver.   You notice swelling of your face, hands, feet, or legs.   You get exposed to German measles and have never had them.   You are exposed to fifth disease or chickenpox.   You develop belly (abdominal) pain. Round ligament discomfort is a common non-cancerous (benign) cause of abdominal pain in pregnancy. Your caregiver still must evaluate you.   You develop a bad headache that does not go away.   You develop fever, diarrhea, pain with urination, or shortness of breath.   You develop visual problems, blurry, or double vision.   You fall or are in a car accident or any kind of trauma.   There is mental or physical violence at home.  Document Released: 02/26/2001 Document Revised: 11/14/2010 Document Reviewed: 08/31/2008 [employer] Patient Information 2012 [employer]. Birth Control Choices Birth control is the use of any practices, methods, or devices to prevent pregnancy from happening in a sexually active woman.  Below are some birth control choices to help avoid  pregnancy.  Not having sex (abstinence) is the surest form of birth control. This requires self-control. There is no risk of acquiring a sexually transmitted disease (STD), including acquired immunodeficiency syndrome (AIDS).   Periodic abstinence requires self-control during certain times of the month.   Calendar method, timing your menstrual periods from month to month.   Ovulation method is avoiding sexual intercourse around the time you produce an egg (ovulate).   Symptotherm method is avoiding sexual intercourse at the time of ovulation, using a thermometer and ovulation symptoms.   Post ovulation method is the timing of sexual intercourse after you ovulated.  These methods do not protect against STDs, including AIDS.  Birth control pills (BCPs) contain estrogen and progesterone hormone. These medicines work by stopping the egg from forming in the ovary (ovulation). Birth control pills are prescribed by a caregiver who will ask you questions about the risks of taking BCPs. Birth control pills do not protect against STDs, including AIDS.   "Minipill" birth control pills have only the progesterone hormone. They are taken every day of each month and must be prescribed by your caregiver. They do not protect against STDs, including AIDS.   Emergency contraception is often call the "morning after" pill. This pill can be taken right after sex or up to five days after sex if you think your birth control failed, you failed to use contraception, or you were forced to have sex. It is most effective the sooner you take the pills after having sexual intercourse. Do not use emergency contraception as your only form of birth control. Emergency contraceptive pills are available without a prescription. Check with your pharmacist.   Condoms are a thin sheath of latex, synthetic material, or lambskin worn over the penis during sexual intercourse. They can have a spermicide in or on them when you buy them.  Latex condoms can prevent pregnancy and STDs. "Natural" or lambskin   condoms can prevent pregnancy but may not protect against STDs, including AIDS.   Female condoms are a soft, loose-fitting sheath that is put into the vagina before sexual intercourse. They can prevent pregnancy and STDs, including AIDS.   Sponge is a soft, circular piece of polyurethane foam with spermicide in it that is inserted into the vagina after wetting it and before sexual intercourse. It does not require a prescription from your caregiver. It does not protect against STDs, including AIDS.   Diaphragm is a soft, latex, dome-shaped barrier that must be fitted by a caregiver. It is inserted into the vagina, along with a spermicidal jelly. After the proper fitting for a diaphragm, always insert the diaphragm before intercourse. The diaphragm should be left in the vagina for 6 to 8 hours after intercourse. Removal and reinsertion with a spermicide is always necessary after any use. It does not protect against STDs, including AIDS.   Progesterone-only injections are given every 3 months to prevent pregnancy. These injections contain synthetic progesterone and no estrogen. This hormone stops the ovaries from releasing eggs. It also causes the cervical mucus to thicken and changes the uterine lining. This makes it harder for sperm to survive in the uterus. It does not protect against STDs, including AIDS.   Birth Control Patch contains hormones similar to those in birth control pills, so effectiveness, risks, and side effects are similar. It must be changed once a week and is prescribed by a caregiver. It is less effective in very overweight women. It does not protect against STDs, including AIDS.   Vaginal Ring contains hormones similar to those in birth control pills. It is left in place for 3 weeks, removed for 1 week, and then a new one is put back into the vagina. It comes with a timer to put in your purse to help you remember when  to take it out or put a new one in. A caregiver's examination and prescription is necessary, just like with birth control pills and the patch. It does not protect against STDs, including AIDS.   Estrogen plus progesterone injections are given every 28 to 30 days. They can be given in the upper arm, thigh, or buttocks. It does not protect against STDs, including AIDS.   Intrauterine device (IUD): copper T or progestin filled is a T-shaped device that is put in a woman's uterus during a menstrual period to prevent pregnancy. The copper T IUD can last 10 years, and the progestin IUD can last 5 years. The progestin IUD can also help control heavy menstrual periods. It does not protect against STDs, including AIDS. The copper T IUD can be used as emergency contraception if inserted within 5 days of having unprotected intercourse.   Cervical cap is a round, soft latex or plastic cup that fits over the cervix and must be fitted by a caregiver. You do not need to use a spermicide with it or remove and insert it every time you have sexual intercourse. It does not protect against STDs, including AIDS.   Spermicides are chemicals that kill or block sperm from entering the cervix and uterus. They come in the form of creams, jellies, suppositories, foam, or tablets, and they do not require a prescription. They are inserted into the vagina with an applicator before having sexual intercourse. This must be repeated every time you have sexual intercourse.   Withdrawal is using the method of the female withdrawing his penis from sexual intercourse before he has a climax   and deposits his sperm. It does not protect against STDs, including AIDS.   Female tubal ligation is when the woman's fallopian tubes are surgically sealed or tied to prevent the egg from traveling to the uterus. It does not protect against STDs, including AIDS.   Female sterilization is when the female has his tubes that carry sperm tied off (vasectomy) to  stop sperm from entering the vagina during sexual intercourse. It does not protect against STDs, including AIDS.  Regardless of which method of birth control you choose, it is still important that you use some form of protection against STDs. Document Released: 03/04/2005 Document Revised: 04/06/2010 Document Reviewed: 01/19/2009 [employer] Patient Information 2012 [employer]. Breastfeeding BENEFITS OF BREASTFEEDING For the baby  The first milk (colostrum) helps the baby's digestive system function better.   There are antibodies from the mother in the milk that help the baby fight off infections.   The baby has a lower incidence of asthma, allergies, and SIDS (sudden infant death syndrome).   The nutrients in breast milk are better than formulas for the baby and helps the baby's brain grow better.   Babies who breastfeed have less gas, colic, and constipation.  For the mother  Breastfeeding helps develop a very special bond between mother and baby.   It is more convenient, always available at the correct temperature and cheaper than formula feeding.   It burns calories in the mother and helps with losing weight that was gained during pregnancy.   It makes the uterus contract back down to normal size faster and slows bleeding following delivery.   Breastfeeding mothers have a lower risk of developing breast cancer.  NURSE FREQUENTLY  A healthy, full-term baby may breastfeed as often as every hour or space his or her feedings to every 3 hours.   How often to nurse will vary from baby to baby. Watch your baby for signs of hunger, not the clock.   Nurse as often as the baby requests, or when you feel the need to reduce the fullness of your breasts.   Awaken the baby if it has been 3 to 4 hours since the last feeding.   Frequent feeding will help the mother make more milk and will prevent problems like sore nipples and engorgement of the breasts.  BABY'S POSITION AT THE  BREAST  Whether lying down or sitting, be sure that the baby's tummy is facing your tummy.   Support the breast with 4 fingers underneath the breast and the thumb above. Make sure your fingers are well away from the nipple and baby's mouth.   Stroke the baby's lips and cheek closest to the breast gently with your finger or nipple.   When the baby's mouth is open wide enough, place all of your nipple and as much of the dark area around the nipple as possible into your baby's mouth.   Pull the baby in close so the tip of the nose and the baby's cheeks touch the breast during the feeding.  FEEDINGS  The length of each feeding varies from baby to baby and from feeding to feeding.   The baby must suck about 2 to 3 minutes for your milk to get to him or her. This is called a "let down." For this reason, allow the baby to feed on each breast as long as he or she wants. Your baby will end the feeding when he or she has received the right balance of nutrients.   To   break the suction, put your finger into the corner of the baby's mouth and slide it between his or her gums before removing your breast from his or her mouth. This will help prevent sore nipples.  REDUCING BREAST ENGORGEMENT  In the first week after your baby is born, you may experience signs of breast engorgement. When breasts are engorged, they feel heavy, warm, full, and may be tender to the touch. You can reduce engorgement if you:   Nurse frequently, every 2 to 3 hours. Mothers who breastfeed early and often have fewer problems with engorgement.   Place light ice packs on your breasts between feedings. This reduces swelling. Wrap the ice packs in a lightweight towel to protect your skin.   Apply moist hot packs to your breast for 5 to 10 minutes before each feeding. This increases circulation and helps the milk flow.   Gently massage your breast before and during the feeding.   Make sure that the baby empties at least one breast  at every feeding before switching sides.   Use a breast pump to empty the breasts if your baby is sleepy or not nursing well. You may also want to pump if you are returning to work or or you feel you are getting engorged.   Avoid bottle feeds, pacifiers or supplemental feedings of water or juice in place of breastfeeding.   Be sure the baby is latched on and positioned properly while breastfeeding.   Prevent fatigue, stress, and anemia.   Wear a supportive bra, avoiding underwire styles.   Eat a balanced diet with enough fluids.  If you follow these suggestions, your engorgement should improve in 24 to 48 hours. If you are still experiencing difficulty, call your lactation consultant or caregiver. IS MY BABY GETTING ENOUGH MILK? Sometimes, mothers worry about whether their babies are getting enough milk. You can be assured that your baby is getting enough milk if:  The baby is actively sucking and you hear swallowing.   The baby nurses at least 8 to 12 times in a 24 hour time period. Nurse your baby until he or she unlatches or falls asleep at the first breast (at least 10 to 20 minutes), then offer the second side.   The baby is wetting 5 to 6 disposable diapers (6 to 8 cloth diapers) in a 24 hour period by 5 to 6 days of age.   The baby is having at least 2 to 3 stools every 24 hours for the first few months. Breast milk is all the food your baby needs. It is not necessary for your baby to have water or formula. In fact, to help your breasts make more milk, it is best not to give your baby supplemental feedings during the early weeks.   The stool should be soft and yellow.   The baby should gain 4 to 7 ounces per week after he is 4 days old.  TAKE CARE OF YOURSELF Take care of your breasts by:  Bathing or showering daily.   Avoiding the use of soaps on your nipples.   Start feedings on your left breast at one feeding and on your right breast at the next feeding.   You will  notice an increase in your milk supply 2 to 5 days after delivery. You may feel some discomfort from engorgement, which makes your breasts very firm and often tender. Engorgement "peaks" out within 24 to 48 hours. In the meantime, apply warm moist towels to your   breasts for 5 to 10 minutes before feeding. Gentle massage and expression of some milk before feeding will soften your breasts, making it easier for your baby to latch on. Wear a well fitting nursing bra and air dry your nipples for 10 to 15 minutes after each feeding.   Only use cotton bra pads.   Only use pure lanolin on your nipples after nursing. You do not need to wash it off before nursing.  Take care of yourself by:   Eating well-balanced meals and nutritious snacks.   Drinking milk, fruit juice, and water to satisfy your thirst (about 8 glasses a day).   Getting plenty of rest.   Increasing calcium in your diet (1200 mg a day).   Avoiding foods that you notice affect the baby in a bad way.  SEEK MEDICAL CARE IF:   You have any questions or difficulty with breastfeeding.   You need help.   You have a hard, red, sore area on your breast, accompanied by a fever of 100.5 F (38.1 C) or more.   Your baby is too sleepy to eat well or is having trouble sleeping.   Your baby is wetting less than 6 diapers per day, by 5 days of age.   Your baby's skin or white part of his or her eyes is more yellow than it was in the hospital.   You feel depressed.  Document Released: 03/04/2005 Document Revised: 11/14/2010 Document Reviewed: 10/17/2008 [employer] Patient Information 2012 [employer] 

## 2011-03-27 ENCOUNTER — Other Ambulatory Visit: Payer: Self-pay

## 2011-03-27 ENCOUNTER — Encounter: Payer: BC Managed Care – PPO | Admitting: Obstetrics and Gynecology

## 2011-04-23 ENCOUNTER — Ambulatory Visit (INDEPENDENT_AMBULATORY_CARE_PROVIDER_SITE_OTHER): Payer: BC Managed Care – PPO | Admitting: Family Medicine

## 2011-04-23 DIAGNOSIS — Z3400 Encounter for supervision of normal first pregnancy, unspecified trimester: Secondary | ICD-10-CM

## 2011-04-23 LAB — CBC
HCT: 35.5 % — ABNORMAL LOW (ref 36.0–46.0)
Hemoglobin: 11.9 g/dL — ABNORMAL LOW (ref 12.0–15.0)
MCH: 31.2 pg (ref 26.0–34.0)
MCHC: 33.5 g/dL (ref 30.0–36.0)
MCV: 92.9 fL (ref 78.0–100.0)
Platelets: 160 10*3/uL (ref 150–400)
RBC: 3.82 MIL/uL — ABNORMAL LOW (ref 3.87–5.11)
RDW: 13.7 % (ref 11.5–15.5)
WBC: 7.8 10*3/uL (ref 4.0–10.5)

## 2011-04-23 NOTE — Progress Notes (Signed)
Discussed weight gain.  FH is normal.

## 2011-04-23 NOTE — Patient Instructions (Addendum)
Birth Control Choices Birth control is the use of any practices, methods, or devices to prevent pregnancy from happening in a sexually active woman.  Below are some birth control choices to help avoid pregnancy.  Not having sex (abstinence) is the surest form of birth control. This requires self-control. There is no risk of acquiring a sexually transmitted disease (STD), including acquired immunodeficiency syndrome (AIDS).   Periodic abstinence requires self-control during certain times of the month.   Calendar method, timing your menstrual periods from month to month.   Ovulation method is avoiding sexual intercourse around the time you produce an egg (ovulate).   Symptotherm method is avoiding sexual intercourse at the time of ovulation, using a thermometer and ovulation symptoms.   Post ovulation method is the timing of sexual intercourse after you ovulated.  These methods do not protect against STDs, including AIDS.  Birth control pills (BCPs) contain estrogen and progesterone hormone. These medicines work by stopping the egg from forming in the ovary (ovulation). Birth control pills are prescribed by a caregiver who will ask you questions about the risks of taking BCPs. Birth control pills do not protect against STDs, including AIDS.   "Minipill" birth control pills have only the progesterone hormone. They are taken every day of each month and must be prescribed by your caregiver. They do not protect against STDs, including AIDS.   Emergency contraception is often call the "morning after" pill. This pill can be taken right after sex or up to five days after sex if you think your birth control failed, you failed to use contraception, or you were forced to have sex. It is most effective the sooner you take the pills after having sexual intercourse. Do not use emergency contraception as your only form of birth control. Emergency contraceptive pills are available without a prescription. Check  with your pharmacist.   Condoms are a thin sheath of latex, synthetic material, or lambskin worn over the penis during sexual intercourse. They can have a spermicide in or on them when you buy them. Latex condoms can prevent pregnancy and STDs. "Natural" or lambskin condoms can prevent pregnancy but may not protect against STDs, including AIDS.   Female condoms are a soft, loose-fitting sheath that is put into the vagina before sexual intercourse. They can prevent pregnancy and STDs, including AIDS.   Sponge is a soft, circular piece of polyurethane foam with spermicide in it that is inserted into the vagina after wetting it and before sexual intercourse. It does not require a prescription from your caregiver. It does not protect against STDs, including AIDS.   Diaphragm is a soft, latex, dome-shaped barrier that must be fitted by a caregiver. It is inserted into the vagina, along with a spermicidal jelly. After the proper fitting for a diaphragm, always insert the diaphragm before intercourse. The diaphragm should be left in the vagina for 6 to 8 hours after intercourse. Removal and reinsertion with a spermicide is always necessary after any use. It does not protect against STDs, including AIDS.   Progesterone-only injections are given every 3 months to prevent pregnancy. These injections contain synthetic progesterone and no estrogen. This hormone stops the ovaries from releasing eggs. It also causes the cervical mucus to thicken and changes the uterine lining. This makes it harder for sperm to survive in the uterus. It does not protect against STDs, including AIDS.   Birth Control Patch contains hormones similar to those in birth control pills, so effectiveness, risks, and side effects   are similar. It must be changed once a week and is prescribed by a caregiver. It is less effective in very overweight women. It does not protect against STDs, including AIDS.   Vaginal Ring contains hormones similar  to those in birth control pills. It is left in place for 3 weeks, removed for 1 week, and then a new one is put back into the vagina. It comes with a timer to put in your purse to help you remember when to take it out or put a new one in. A caregiver's examination and prescription is necessary, just like with birth control pills and the patch. It does not protect against STDs, including AIDS.   Estrogen plus progesterone injections are given every 28 to 30 days. They can be given in the upper arm, thigh, or buttocks. It does not protect against STDs, including AIDS.   Intrauterine device (IUD): copper T or progestin filled is a T-shaped device that is put in a woman's uterus during a menstrual period to prevent pregnancy. The copper T IUD can last 10 years, and the progestin IUD can last 5 years. The progestin IUD can also help control heavy menstrual periods. It does not protect against STDs, including AIDS. The copper T IUD can be used as emergency contraception if inserted within 5 days of having unprotected intercourse.   Cervical cap is a round, soft latex or plastic cup that fits over the cervix and must be fitted by a caregiver. You do not need to use a spermicide with it or remove and insert it every time you have sexual intercourse. It does not protect against STDs, including AIDS.   Spermicides are chemicals that kill or block sperm from entering the cervix and uterus. They come in the form of creams, jellies, suppositories, foam, or tablets, and they do not require a prescription. They are inserted into the vagina with an applicator before having sexual intercourse. This must be repeated every time you have sexual intercourse.   Withdrawal is using the method of the female withdrawing his penis from sexual intercourse before he has a climax and deposits his sperm. It does not protect against STDs, including AIDS.   Female tubal ligation is when the woman's fallopian tubes are surgically sealed  or tied to prevent the egg from traveling to the uterus. It does not protect against STDs, including AIDS.   Female sterilization is when the female has his tubes that carry sperm tied off (vasectomy) to stop sperm from entering the vagina during sexual intercourse. It does not protect against STDs, including AIDS.  Regardless of which method of birth control you choose, it is still important that you use some form of protection against STDs. Document Released: 03/04/2005 Document Revised: 04/06/2010 Document Reviewed: 01/19/2009 [facility] Patient Information 2012 [place], [place]. Postpartum Depression After delivery, your body is going through a drastic change in hormone levels. You may find yourself crying for no apparent reason and unable to cope with all the changes a new baby brings. This is a common response following a pregnancy. Seek support from your partner and/or friends and just give yourself time to recover. If these feelings persist and you feel you are getting worse, contact your caregiver or other professionals who can help you. WHAT IS DEPRESSION? Depression can be described as feeling sad, blue, unhappy, miserable, or down in the dumps. Most of [place] feel this way at one time or another for short periods. But true clinical depression is a mood disorder  in which feelings of sadness, loss, anger, fear, or frustration interfere with everyday life for an extended time. Depression can be mild, moderate, or severe. The degree of depression, which your caregiver can determine, influences your treatment. Postpartum depression occurs within a couple days to months after delivering your baby. HOW COMMON IS DEPRESSION DURING AND AFTER PREGNANCY? Depression that occurs during pregnancy or within a year after delivery is called perinatal depression. Depression after pregnancy is also called postpartum depression or peripartum depression. The exact number of women with depression during this time is unknown,  but it occurs in between 10-15% of women. Researchers believe that depression is one of the most common complications during and after pregnancy. The depression is often not recognized or treated, because some normal pregnancy changes cause similar symptoms and are happening at the same time. Tiredness, problems sleeping, stronger emotional reactions, and changes in body weight may occur during and after pregnancy. But these symptoms may also be signs of depression.  CAUSES  Rapid hormone changes. Estrogen and progesterone usually decrease immediately after delivering your baby. Researchers think the fast change in hormone levels may lead to depression, just as smaller changes in hormones can affect a woman's moods before she gets her menstrual period.   Decrease in thyroid hormone. Thyroid hormone regulates how your body uses and stores energy from food (metabolism). A simple blood test can tell if this condition is causing a woman's depression. If so, thyroid medicine can be prescribed by your caregiver.   A stressful life event, such as a death in the family. This can cause chemical changes in the brain that lead to depression.   Feeling overwhelmed by caring for and raising a new baby.   Depression is also an illness that runs in some families. It is not always clear what causes depression.  FACTORS THAT MAY INCREASE A WOMAN'S CHANCE OF DEPRESSION DURING PREGNANCY:  History of depression.   Substance abuse, alcohol, or drugs.   Little support from family and friends.   Problems with previous pregnancy or birth.   Young age for motherhood.   Living alone.   Little or no social support.   Family history of mental illness.   Anxiety about the fetus.   Marital or financial problems.   Postpartum depression in a previous pregnancy.   Having a psychiatric illness (schizophrenia, bipolar disorder).   Going through a difficult or stressful pregnancy.   Going through a difficult  labor and delivery.   Moving to another city or state during your pregnancy, or just after delivering your baby.  OTHER FACTORS THAT MAY CONTRIBUTE TO POSTPARTUM DEPRESSION INCLUDE:   Feeling tired after delivery, broken sleep patterns, and not getting enough rest. This often keeps a new mother from regaining her full strength for weeks.   Feeling overwhelmed with a new baby to take care of and doubting your ability to be a good mother.   Feeling stress from changes in work and home routines. Women sometimes think they need to be "super mom" or perfect. This is not realistic and can add stress.   Having feelings of loss. This can include loss of the identity of who you are, or were, before having the baby, loss of control, loss of your pre-pregnancy figure, and feeling less attractive.   Having less free time and less control over your time. Needing to stay home, indoors, for longer periods of time and having less time to spend with your partner and loved ones can   contribute to depression.   Having trouble doing your daily activities at home or at work.   Fears about not knowing how to take of the baby correctly and about harming the baby.   Feelings of guilt that you are not taking care of the baby properly.  SYMPTOMS Any of these symptoms, during and after pregnancy, that last longer than 2 weeks are signs of depression:  Feeling restless or irritable.   Feeling sad, hopeless, and overwhelmed.   Crying a lot.   Having no energy or motivation.   Eating too little or too much.   Sleeping too little or too much.   Trouble focusing, remembering, or making decisions.   Feeling worthless and guilty.   Loss of interest or pleasure in activities.   Withdrawal from friends and family.   Having headaches, chest pains, rapid or irregular heartbeat (palpitations), or fast and shallow breathing (hyperventilation).   After pregnancy, being afraid of hurting the baby or oneself, and  not having any interest in the baby.   Not being able to care for yourself or the baby.   Loss of interest in caring for the baby.   Anxiety and panic attacks.   Thoughts of harming yourself, the baby, or someone else.   Feelings of guilt because you feel you are not taking care of the baby well enough.  WHAT IS THE DIFFERENCE BETWEEN "BABY BLUES," POSTPARTUM DEPRESSION, AND POSTPARTUM PSYCHOSIS?  The "baby blues" occurs 70 to 80% of the time, and it can happen in the days right after childbirth. It normally goes away within a few days to a week. A new mother can have sudden mood swings, sadness, crying spells, loss of appetite, sleeping problems, and feel irritable, restless, anxious, and lonely. Symptoms are not severe and treatment usually is not needed. But there are things you can do to feel better. Nap when the baby does. Ask for help from your spouse, family members, and friends. Join a support group of new moms or talk with other moms. If the "baby blues" does not go away in a week to 10 days or gets worse, you may have postpartum depression.   Postpartum depression can happen anytime within the first year after childbirth. A woman may have a number of symptoms, such as sadness, lack of energy, trouble concentrating, anxiety, and feelings of guilt and worthlessness. The difference between postpartum depression and the "baby blues" is that the feelings in postpartum depression are much stronger and often affects a woman's well-being. It keeps her from functioning well for a longer period of time. Postpartum depression needs to be treated by a caregiver. Counseling, support groups, and medicines can help.   Postpartum psychosis is rare. It occurs in 1 or 2 out of every 1000 births. It usually begins in the first 6 weeks after delivery. Women who have bipolar disorder, schizoaffective disorder, or family history of psychotic disease have a higher risk for developing postpartum psychosis.  Symptoms may include delusions, hallucinations, sleep disturbances, and obsessive thoughts about the baby. A woman may have rapid mood swings, from depression, to irritability, to euphoria. This is a serious condition and needs professional care and treatment.  WHAT STEPS CAN I TAKE IF I HAVE SYMPTOMS OF DEPRESSION DURING PREGNANCY OR AFTER CHILDBIRTH?  Some women do not tell anyone about their symptoms, because they feel embarrassed, ashamed, or guilty about feeling depressed when they are supposed to be happy. They worry that they will be viewed as unfit parents.   Perinatal depression can happen to any woman. It does not mean you are a bad or a "not together" mom. You and your baby do not need to suffer. There is help. You should discuss these feelings with your spouse or partner, family, and caregiver.   There are different types of individual and group "talk therapies" that can help a woman with perinatal depression feel better and do better as a mom and as a person. Limited research suggests that many women with perinatal depression improve when treated with antidepressant medicine. Your caregiver can help you learn more about these options and decide which approach is best for you and your baby.   Speak to your caregiver if you are having symptoms of depression while you are pregnant or after you deliver your baby. Your caregiver can give you a questionnaire to test for depression. You can also be referred to a mental health professional who specializes in treating depression.  HOME CARE INSTRUCTIONS  Try to get as much rest as you can. Try to nap when the baby naps.   Stop putting pressure on yourself to do everything. Do as much as you can and leave the rest.   Ask for help with household chores and nighttime feedings. Ask your partner to bring the baby to you so you can breastfeed. If you can, have a friend, family member, or professional support person help you in the home for part of the day.    Talk to your partner, family, and friends about how you are feeling.   Do not spend a lot of time alone. Get dressed and leave the house. Run an errand or take a short walk.   Spend time alone with your partner.   Talk with other mothers so you can learn from their experiences.   Join a support group for women with depression. Call a local hotline or look in your telephone book for information and services.   Do not make any major life changes during pregnancy. Major changes can cause unneeded stress. However, sometimes big changes cannot be avoided. Arrange support and help in your new situation ahead of time.   Exercise regularly.   Eat a balanced and nourishing diet.   Seek help if there are marital or financial problems.   Take the medicine your caregiver gives, as directed.   Keep all your postpartum appointments.  TREATMENT There are 2 common types of treatment for depression.  Talk therapy. This involves talking to a therapist, psychologist, clergyperson, or social worker, in order to learn to change how depression makes you think, feel, and act.   Medicine. Your caregiver can give you an antidepressant medicine to help you. These medicines can help relieve the symptoms of depression.   Women who are pregnant or breast-feeding should talk with their caregivers about the advantages and risks of taking antidepressant medicines. Some women are concerned that taking these medicines may harm the baby. A mother's depression can affect her baby's development. Getting treatment is important for both mother and baby. The risks of taking medicine must be weighed against the risks of depression. It is a decision that women need to discuss carefully with their caregivers. Women who decide to take antidepressant medicines should talk to their caregivers about which antidepressant medicines are safer to take while pregnant or breastfeeding.  What effects can untreated depression  have?  Depression not only hurts the mother, but it also affects her family. Some researchers have found that depression during pregnancy  can raise the risk of delivering an underweight baby or a premature infant. Some women with depression have difficulty caring for themselves during pregnancy. They may have trouble eating and do not gain enough weight during the pregnancy. They may also have trouble sleeping, may miss prenatal visits, may not follow medical instructions, have a poor diet, or may use harmful substances, like tobacco, alcohol, or illegal drugs.   Postpartum depression can affect a mother's ability to parent. She may lack energy, have trouble concentrating, be irritable, and not be able to meet her child's needs for love and affection. As a result, she may feel guilty and lose confidence in herself as a mother. This can make the depression worse. Researchers believe that postpartum depression can affect the infant by causing delays in language development, problems with emotional bonding to others, behavioral problems, lower activity levels, sleep problems, and distress. It helps if the father or another caregiver can assist in meeting the needs of the baby, and other children in the family, while the mother is depressed.   All children deserve the chance to have a healthy mom. All moms deserve the chance to enjoy their life and their children. Do not suffer alone. If you are experiencing symptoms of depression during pregnancy or after having a baby, tell a loved one and call your caregiver right away.  SEEK MEDICAL CARE IF:  You think you have postpartum depression.   You want medicine to treat your postpartum depression.   You want a referral to a psychiatrist or psychologist.   You are having a reaction or problems with your medicine.  SEEK IMMEDIATE MEDICAL CARE IF:  You have suicidal feelings.   You feel you may harm the baby.   You feel you may harm your spouse/partner,  or someone else.   You feel you need to be admitted to a hospital now.   You feel you are losing control and need treatment immediately.  FOR MORE INFORMATION National Women's Health Information Center: www.womenshealth.gov National Institute of Mental Health, NIH, HHS: www.nimh.nih.gov American Psychological Association: www.apa.org  Postpartum Education for Parents: www.sbpep.org National Mental Health Information Center, SAMHSA, HHS: www.mentalhealth.org  National Mental Health Association: www.nmha.org Postpartum Support International: www.postpartum.net  Document Released: 12/07/2003 Document Revised: 11/14/2010 Document Reviewed: 03/16/2009 [employer] Patient Information 2012 [employer]. Breastfeeding BENEFITS OF BREASTFEEDING For the baby  The first milk (colostrum) helps the baby's digestive system function better.   There are antibodies from the mother in the milk that help the baby fight off infections.   The baby has a lower incidence of asthma, allergies, and SIDS (sudden infant death syndrome).   The nutrients in breast milk are better than formulas for the baby and helps the baby's brain grow better.   Babies who breastfeed have less gas, colic, and constipation.  For the mother  Breastfeeding helps develop a very special bond between mother and baby.   It is more convenient, always available at the correct temperature and cheaper than formula feeding.   It burns calories in the mother and helps with losing weight that was gained during pregnancy.   It makes the uterus contract back down to normal size faster and slows bleeding following delivery.   Breastfeeding mothers have a lower risk of developing breast cancer.  NURSE FREQUENTLY  A healthy, full-term baby may breastfeed as often as every hour or space his or her feedings to every 3 hours.   How often to nurse will vary from baby to   baby. Watch your baby for signs of hunger, not the clock.   Nurse as  often as the baby requests, or when you feel the need to reduce the fullness of your breasts.   Awaken the baby if it has been 3 to 4 hours since the last feeding.   Frequent feeding will help the mother make more milk and will prevent problems like sore nipples and engorgement of the breasts.  BABY'S POSITION AT THE BREAST  Whether lying down or sitting, be sure that the baby's tummy is facing your tummy.   Support the breast with 4 fingers underneath the breast and the thumb above. Make sure your fingers are well away from the nipple and baby's mouth.   Stroke the baby's lips and cheek closest to the breast gently with your finger or nipple.   When the baby's mouth is open wide enough, place all of your nipple and as much of the dark area around the nipple as possible into your baby's mouth.   Pull the baby in close so the tip of the nose and the baby's cheeks touch the breast during the feeding.  FEEDINGS  The length of each feeding varies from baby to baby and from feeding to feeding.   The baby must suck about 2 to 3 minutes for your milk to get to him or her. This is called a "let down." For this reason, allow the baby to feed on each breast as long as he or she wants. Your baby will end the feeding when he or she has received the right balance of nutrients.   To break the suction, put your finger into the corner of the baby's mouth and slide it between his or her gums before removing your breast from his or her mouth. This will help prevent sore nipples.  REDUCING BREAST ENGORGEMENT  In the first week after your baby is born, you may experience signs of breast engorgement. When breasts are engorged, they feel heavy, warm, full, and may be tender to the touch. You can reduce engorgement if you:   Nurse frequently, every 2 to 3 hours. Mothers who breastfeed early and often have fewer problems with engorgement.   Place light ice packs on your breasts between feedings. This reduces  swelling. Wrap the ice packs in a lightweight towel to protect your skin.   Apply moist hot packs to your breast for 5 to 10 minutes before each feeding. This increases circulation and helps the milk flow.   Gently massage your breast before and during the feeding.   Make sure that the baby empties at least one breast at every feeding before switching sides.   Use a breast pump to empty the breasts if your baby is sleepy or not nursing well. You may also want to pump if you are returning to work or or you feel you are getting engorged.   Avoid bottle feeds, pacifiers or supplemental feedings of water or juice in place of breastfeeding.   Be sure the baby is latched on and positioned properly while breastfeeding.   Prevent fatigue, stress, and anemia.   Wear a supportive bra, avoiding underwire styles.   Eat a balanced diet with enough fluids.  If you follow these suggestions, your engorgement should improve in 24 to 48 hours. If you are still experiencing difficulty, call your lactation consultant or caregiver. IS MY BABY GETTING ENOUGH MILK? Sometimes, mothers worry about whether their babies are getting enough milk. You can be   assured that your baby is getting enough milk if:  The baby is actively sucking and you hear swallowing.   The baby nurses at least 8 to 12 times in a 24 hour time period. Nurse your baby until he or she unlatches or falls asleep at the first breast (at least 10 to 20 minutes), then offer the second side.   The baby is wetting 5 to 6 disposable diapers (6 to 8 cloth diapers) in a 24 hour period by 23 to 10 days of age.   The baby is having at least 2 to 3 stools every 24 hours for the first few months. Breast milk is all the food your baby needs. It is not necessary for your baby to have water or formula. In fact, to help your breasts make more milk, it is best not to give your baby supplemental feedings during the early weeks.   The stool should be soft and  yellow.   The baby should gain 4 to 7 ounces per week after he is 38 days old.  TAKE CARE OF YOURSELF Take care of your breasts by:  Bathing or showering daily.   Avoiding the use of soaps on your nipples.   Start feedings on your left breast at one feeding and on your right breast at the next feeding.   You will notice an increase in your milk supply 2 to 5 days after delivery. You may feel some discomfort from engorgement, which makes your breasts very firm and often tender. Engorgement "peaks" out within 24 to 48 hours. In the meantime, apply warm moist towels to your breasts for 5 to 10 minutes before feeding. Gentle massage and expression of some milk before feeding will soften your breasts, making it easier for your baby to latch on. Wear a well fitting nursing bra and air dry your nipples for 10 to 15 minutes after each feeding.   Only use cotton bra pads.   Only use pure lanolin on your nipples after nursing. You do not need to wash it off before nursing.  Take care of yourself by:   Eating well-balanced meals and nutritious snacks.   Drinking milk, fruit juice, and water to satisfy your thirst (about 8 glasses a day).   Getting plenty of rest.   Increasing calcium in your diet (1200 mg a day).   Avoiding foods that you notice affect the baby in a bad way.  SEEK MEDICAL CARE IF:   You have any questions or difficulty with breastfeeding.   You need help.   You have a hard, red, sore area on your breast, accompanied by a fever of 100.5 F (38.1 C) or more.   Your baby is too sleepy to eat well or is having trouble sleeping.   Your baby is wetting less than 6 diapers per day, by 51 days of age.   Your baby's skin or white part of his or her eyes is more yellow than it was in the hospital.   You feel depressed.  Document Released: 03/04/2005 Document Revised: 11/14/2010 Document Reviewed: 10/17/2008 [facility] Patient Information 2012 [place], [place]. Preterm  Labor Preterm labor is when labor starts at less than 37 weeks of pregnancy. The normal length of a pregnancy is 39 to 41 weeks. CAUSES Often, there is no identifiable underlying cause as to why a woman goes into preterm labor. However, one of the most common known causes of preterm labor is infection. Infections of the uterus, cervix, vagina, amniotic sac,  bladder, kidney, or even the lungs (pneumonia) can cause labor to start. Other causes of preterm labor include:  Urogenital infections, such as yeast infections and bacterial vaginosis.   Uterine abnormalities (uterine shape, uterine septum, fibroids, bleeding from the placenta).   A cervix that has been operated on and opens prematurely.   Malformations in the baby.   Multiple gestations (twins, triplets, and so on).   Breakage of the amniotic sac.  Additional risk factors for preterm labor include:  Previous history of preterm labor.   Premature rupture of membranes (PROM).   A placenta that covers the opening of the cervix (placenta previa).   A placenta that separates from the uterus (placenta abruption).   A cervix that is too weak to hold the baby in the uterus (incompetence cervix).   Having too much fluid in the amniotic sac (polyhydramnios).   Taking illegal drugs or smoking while pregnant.   Not gaining enough weight while pregnant.   Women younger than 43 and older than 30 years old.   Low socioeconomic status.   African-American ethnicity.  SYMPTOMS Signs and symptoms of preterm labor include:  Menstrual-like cramps.   Contractions that are 30 to 70 seconds apart, become very regular, closer together, and are more intense and painful.   Contractions that start on the top of the uterus and spread down to the lower abdomen and back.   A sense of increased pelvic pressure or back pain.   A watery or bloody discharge that comes from the vagina.  DIAGNOSIS  A diagnosis can be confirmed by:  A vaginal  exam.   An ultrasound of the cervix.   Sampling (swabbing) cervico-vaginal secretions. These samples can be tested for the presence of fetal fibronectin. This is a protein found in cervical discharge which is associated with preterm labor.   Fetal monitoring.  TREATMENT  Depending on the length of the pregnancy and other circumstances, a caregiver may suggest bed rest. If necessary, there are medicines that can be given to stop contractions and to quicken fetal lung maturity. If labor happens before 34 weeks of pregnancy, a prolonged hospital stay may be recommended. Treatment depends on the condition of both the mother and baby. PREVENTION There are some things a mother can do to lower the risk of preterm labor in future pregnancies. A woman can:   Stop smoking.   Maintain healthy weight gain and avoid chemicals and drugs that are not necessary.   Be watchful for any type of infection.   Inform her caregiver if she has a known history of preterm labor.  Document Released: 05/25/2003 Document Revised: 11/14/2010 Document Reviewed: 06/29/2010 [facility] Patient Information 2012 [place], [place].

## 2011-04-24 LAB — HIV ANTIBODY (ROUTINE TESTING W REFLEX): HIV: NONREACTIVE

## 2011-04-24 LAB — RPR

## 2011-04-24 LAB — GLUCOSE TOLERANCE, 1 HOUR: Glucose, 1 Hour GTT: 64 mg/dL — ABNORMAL LOW (ref 70–140)

## 2011-04-25 ENCOUNTER — Inpatient Hospital Stay (HOSPITAL_COMMUNITY)
Admission: AD | Admit: 2011-04-25 | Discharge: 2011-04-25 | Disposition: A | Discharge status: Home / Self Care | Payer: BC Managed Care – PPO | Source: Ambulatory Visit | Attending: Obstetrics & Gynecology | Admitting: Obstetrics & Gynecology

## 2011-04-25 ENCOUNTER — Encounter (HOSPITAL_COMMUNITY): Payer: Self-pay | Admitting: *Deleted

## 2011-04-25 DIAGNOSIS — R109 Unspecified abdominal pain: Secondary | ICD-10-CM | POA: Insufficient documentation

## 2011-04-25 DIAGNOSIS — O26899 Other specified pregnancy related conditions, unspecified trimester: Secondary | ICD-10-CM

## 2011-04-25 DIAGNOSIS — O99891 Other specified diseases and conditions complicating pregnancy: Secondary | ICD-10-CM | POA: Insufficient documentation

## 2011-04-25 LAB — URINALYSIS, ROUTINE W REFLEX MICROSCOPIC
Bilirubin Urine: NEGATIVE
Glucose, UA: NEGATIVE mg/dL
Hgb urine dipstick: NEGATIVE
Ketones, ur: NEGATIVE mg/dL
Nitrite: NEGATIVE
Protein, ur: NEGATIVE mg/dL
Specific Gravity, Urine: <1.005 — ABNORMAL LOW (ref 1.005–1.030)
Urobilinogen, UA: 0.2 mg/dL (ref 0.0–1.0)
pH: 6 (ref 5.0–8.0)

## 2011-04-25 LAB — URINE MICROSCOPIC-ADD ON

## 2011-04-25 NOTE — ED Provider Notes (Signed)
Chief Complaint:  Abdominal Pain   Stephanie [name] is  31 y.o. G1P0 at [redacted]w[redacted]d presents with1 day hx Abdominal Pain Pain has been continuous all day, intermittently more uncomfortable and not worse or alleviated by walking and position changes. She is unsure if she has contractions but discomfort is only in lower abd- describes as pressure.  No vaginal bleeding or ruptured membranes. Good fetal movement  . Denies dysuria, urinary frequency, urgency, or hematuria.  Next pnv @ [facility] on 2/20.    PNC at [facility], uncomplicated with last visit 2 days ago.   Past Medical History: Past Medical History  Diagnosis Date   Anxiety    Headache     SEVERE MIGRANES    Past Surgical History: Past Surgical History  Procedure Date   Knee arthroscopy      X 4 ON LEFT KNEE   Knee arthroscopy w/ oats procedure      X1 ON LEFT KNEE   Wisdom tooth extraction      ALL 4 REMOVE    Family History: Family History  Problem Relation Age of Onset   Anxiety disorder Mother    Alcohol abuse Father    Mental illness Sister    Alcohol abuse Maternal Aunt    Mental illness Paternal Aunt    Heart disease Maternal Grandmother     Social History: History  Substance Use Topics   Smoking status: Never Smoker    Smokeless tobacco: Never Used   Alcohol Use: No    Allergies: No Known Allergies  Meds:  Prescriptions prior to admission  Medication Sig Dispense Refill   butalbital-acetaminophen-caffeine (FIORICET, ESGIC) 50-325-40 MG per tablet Take 1 tablet by mouth every 4 (four) hours as needed. Takes for migraines       Prenatal Vit-Fe Fumarate-FA (MULTIVITAMIN-PRENATAL) 27-0.8 MG TABS Take 1 tablet by mouth daily.          Review of Systems - Please refer to the aforementioned patients' reports.    ster:[id number]} Physical Exam  Blood pressure 131/88, pulse 100, temperature 98.9 F (37.2 C), temperature source Oral, resp. rate 20, height 5\' 4"  (1.626 m), weight 70.308 kg (155 lb), last  menstrual period 10/25/2010, SpO2 98.00%. GENERAL: Well-developed, well-nourished female in no acute distress.  Heart: Regular rhythm and rate, no murmur  Lungs: CTAB Abd: soft, non-tender Extremities: no edema, DTRs 2+, no clonus SVE: outer os ft, inner os closed/25%/-2, posterior and soft FHR: 140, moderate variability, 15x15accels, no decels=Cat I UCs: 1 uc x 40 seconds  Labs: Results for orders placed during the hospital encounter of 04/25/11 (from the past 24 hour(s))  URINALYSIS, ROUTINE W REFLEX MICROSCOPIC     Status: Abnormal   Collection Time   04/25/11  6:50 PM      Component Value Range   Color, Urine YELLOW  YELLOW    APPearance CLEAR  CLEAR    Specific Gravity, Urine <1.005 (*) 1.005 - 1.030    pH 6.0  5.0 - 8.0    Glucose, UA NEGATIVE  NEGATIVE (mg/dL)   Hgb urine dipstick NEGATIVE  NEGATIVE    Bilirubin Urine NEGATIVE  NEGATIVE    Ketones, ur NEGATIVE  NEGATIVE (mg/dL)   Protein, ur NEGATIVE  NEGATIVE (mg/dL)   Urobilinogen, UA 0.2  0.0 - 1.0 (mg/dL)   Nitrite NEGATIVE  NEGATIVE    Leukocytes, UA MODERATE (*) NEGATIVE   URINE MICROSCOPIC-ADD ON     Status: Abnormal   Collection Time   04/25/11  6:50 PM  Component Value Range   Squamous Epithelial / LPF MANY (*) RARE    WBC, UA 11-20  <3 (WBC/hpf)   Bacteria, UA FEW (*) RARE      Imaging Studies:  No results found.  Assessment/Plan: A: IUP @ 26.6     Pressure not associated w/ infection or PTL     FHR Cat I     Reassuring maternal/fetal status  P: D/C home     Keep pnv at [facility] 2/20     Return to [facility]U for s/s ptl, srom, vb, decreased fm or other concerns.  Stephanie [name], CNM present for exam and agrees w/ Stephanie [name], SNM 04/25/11 @ 2114   [name],Stephanie 2/7/20137:08 PM  Care transferred to Stephanie [name] CNM at [phone].

## 2011-04-25 NOTE — Progress Notes (Signed)
C/o abd pain that gets worse throughout the day, sharp in LLQ, pelvic pressure, denies dysuria.

## 2011-04-29 NOTE — ED Provider Notes (Signed)
Attestation of Attending Supervision of Advanced Practitioner: Evaluation and management procedures were performed by the PA/NP/CNM/OB Fellow under my supervision/collaboration. Chart reviewed, and agree with management and plan.  Jozef Eisenbeis, M.D. 04/29/2011 1:42 PM   

## 2011-05-08 ENCOUNTER — Encounter: Payer: BC Managed Care – PPO | Admitting: Obstetrics & Gynecology

## 2011-05-08 ENCOUNTER — Ambulatory Visit (INDEPENDENT_AMBULATORY_CARE_PROVIDER_SITE_OTHER): Payer: BC Managed Care – PPO | Admitting: Obstetrics & Gynecology

## 2011-05-08 DIAGNOSIS — G43909 Migraine, unspecified, not intractable, without status migrainosus: Secondary | ICD-10-CM

## 2011-05-08 DIAGNOSIS — Z3400 Encounter for supervision of normal first pregnancy, unspecified trimester: Secondary | ICD-10-CM

## 2011-05-08 MED ORDER — BUTALBITAL-APAP-CAFFEINE 50-325-40 MG PO TABS: 1.0000 | ORAL_TABLET | ORAL | Status: DC | PRN

## 2011-05-08 NOTE — Progress Notes (Signed)
Patient is here for routine prenatal check.  She is doing well. 

## 2011-05-08 NOTE — Patient Instructions (Signed)
Return to clinic for any obstetric concerns or go to [facility]U for evaluation  

## 2011-05-08 NOTE — Progress Notes (Signed)
Normal GTT and labs. No complaints or concerns.  Fetal movement and labor precautions reviewed.  

## 2011-05-22 ENCOUNTER — Ambulatory Visit (INDEPENDENT_AMBULATORY_CARE_PROVIDER_SITE_OTHER): Payer: BC Managed Care – PPO | Admitting: Obstetrics & Gynecology

## 2011-05-22 VITALS — BP 115/79 | Wt 157.0 lb

## 2011-05-22 DIAGNOSIS — Z3400 Encounter for supervision of normal first pregnancy, unspecified trimester: Secondary | ICD-10-CM

## 2011-05-22 NOTE — Patient Instructions (Signed)
Breastfeeding BENEFITS OF BREASTFEEDING For the baby  The first milk (colostrum) helps the baby's digestive system function better.   There are antibodies from the mother in the milk that help the baby fight off infections.   The baby has a lower incidence of asthma, allergies, and SIDS (sudden infant death syndrome).   The nutrients in breast milk are better than formulas for the baby and helps the baby's brain grow better.   Babies who breastfeed have less gas, colic, and constipation.  For the mother  Breastfeeding helps develop a very special bond between mother and baby.   It is more convenient, always available at the correct temperature and cheaper than formula feeding.   It burns calories in the mother and helps with losing weight that was gained during pregnancy.   It makes the uterus contract back down to normal size faster and slows bleeding following delivery.   Breastfeeding mothers have a lower risk of developing breast cancer.  NURSE FREQUENTLY  A healthy, full-term baby may breastfeed as often as every hour or space his or her feedings to every 3 hours.   How often to nurse will vary from baby to baby. Watch your baby for signs of hunger, not the clock.   Nurse as often as the baby requests, or when you feel the need to reduce the fullness of your breasts.   Awaken the baby if it has been 3 to 4 hours since the last feeding.   Frequent feeding will help the mother make more milk and will prevent problems like sore nipples and engorgement of the breasts.  BABY'S POSITION AT THE BREAST  Whether lying down or sitting, be sure that the baby's tummy is facing your tummy.   Support the breast with 4 fingers underneath the breast and the thumb above. Make sure your fingers are well away from the nipple and baby's mouth.   Stroke the baby's lips and cheek closest to the breast gently with your finger or nipple.   When the baby's mouth is open wide enough, place all  of your nipple and as much of the dark area around the nipple as possible into your baby's mouth.   Pull the baby in close so the tip of the nose and the baby's cheeks touch the breast during the feeding.  FEEDINGS  The length of each feeding varies from baby to baby and from feeding to feeding.   The baby must suck about 2 to 3 minutes for your milk to get to him or her. This is called a "let down." For this reason, allow the baby to feed on each breast as long as he or she wants. Your baby will end the feeding when he or she has received the right balance of nutrients.   To break the suction, put your finger into the corner of the baby's mouth and slide it between his or her gums before removing your breast from his or her mouth. This will help prevent sore nipples.  REDUCING BREAST ENGORGEMENT  In the first week after your baby is born, you may experience signs of breast engorgement. When breasts are engorged, they feel heavy, warm, full, and may be tender to the touch. You can reduce engorgement if you:   Nurse frequently, every 2 to 3 hours. Mothers who breastfeed early and often have fewer problems with engorgement.   Place light ice packs on your breasts between feedings. This reduces swelling. Wrap the ice packs in a   lightweight towel to protect your skin.   Apply moist hot packs to your breast for 5 to 10 minutes before each feeding. This increases circulation and helps the milk flow.   Gently massage your breast before and during the feeding.   Make sure that the baby empties at least one breast at every feeding before switching sides.   Use a breast pump to empty the breasts if your baby is sleepy or not nursing well. You may also want to pump if you are returning to work or or you feel you are getting engorged.   Avoid bottle feeds, pacifiers or supplemental feedings of water or juice in place of breastfeeding.   Be sure the baby is latched on and positioned properly while  breastfeeding.   Prevent fatigue, stress, and anemia.   Wear a supportive bra, avoiding underwire styles.   Eat a balanced diet with enough fluids.  If you follow these suggestions, your engorgement should improve in 24 to 48 hours. If you are still experiencing difficulty, call your lactation consultant or caregiver. IS MY BABY GETTING ENOUGH MILK? Sometimes, mothers worry about whether their babies are getting enough milk. You can be assured that your baby is getting enough milk if:  The baby is actively sucking and you hear swallowing.   The baby nurses at least 8 to 12 times in a 24 hour time period. Nurse your baby until he or she unlatches or falls asleep at the first breast (at least 10 to 20 minutes), then offer the second side.   The baby is wetting 5 to 6 disposable diapers (6 to 8 cloth diapers) in a 24 hour period by 5 to 6 days of age.   The baby is having at least 2 to 3 stools every 24 hours for the first few months. Breast milk is all the food your baby needs. It is not necessary for your baby to have water or formula. In fact, to help your breasts make more milk, it is best not to give your baby supplemental feedings during the early weeks.   The stool should be soft and yellow.   The baby should gain 4 to 7 ounces per week after he is 4 days old.  TAKE CARE OF YOURSELF Take care of your breasts by:  Bathing or showering daily.   Avoiding the use of soaps on your nipples.   Start feedings on your left breast at one feeding and on your right breast at the next feeding.   You will notice an increase in your milk supply 2 to 5 days after delivery. You may feel some discomfort from engorgement, which makes your breasts very firm and often tender. Engorgement "peaks" out within 24 to 48 hours. In the meantime, apply warm moist towels to your breasts for 5 to 10 minutes before feeding. Gentle massage and expression of some milk before feeding will soften your breasts, making  it easier for your baby to latch on. Wear a well fitting nursing bra and air dry your nipples for 10 to 15 minutes after each feeding.   Only use cotton bra pads.   Only use pure lanolin on your nipples after nursing. You do not need to wash it off before nursing.  Take care of yourself by:   Eating well-balanced meals and nutritious snacks.   Drinking milk, fruit juice, and water to satisfy your thirst (about 8 glasses a day).   Getting plenty of rest.   Increasing calcium in   your diet (1200 mg a day).   Avoiding foods that you notice affect the baby in a bad way.  SEEK MEDICAL CARE IF:   You have any questions or difficulty with breastfeeding.   You need help.   You have a hard, red, sore area on your breast, accompanied by a fever of 100.5 F (38.1 C) or more.   Your baby is too sleepy to eat well or is having trouble sleeping.   Your baby is wetting less than 6 diapers per day, by 5 days of age.   Your baby's skin or white part of his or her eyes is more yellow than it was in the hospital.   You feel depressed.  Document Released: 03/04/2005 Document Revised: 02/21/2011 Document Reviewed: 10/17/2008 [employer] Patient Information 2012 [employer]. 

## 2011-05-22 NOTE — Progress Notes (Signed)
Complains of constipation, recommended OTC stool softeners. No other complaints or concerns.  Fetal movement and labor precautions reviewed.

## 2011-06-05 ENCOUNTER — Ambulatory Visit (INDEPENDENT_AMBULATORY_CARE_PROVIDER_SITE_OTHER): Payer: BC Managed Care – PPO | Admitting: Obstetrics & Gynecology

## 2011-06-05 VITALS — BP 129/85 | Wt 160.0 lb

## 2011-06-05 DIAGNOSIS — Z3400 Encounter for supervision of normal first pregnancy, unspecified trimester: Secondary | ICD-10-CM

## 2011-06-05 NOTE — Progress Notes (Signed)
Nausea may be due to GERD, recommended anti-acid medications (Zantac, Tums), will continue to monitor. No other complaints or concerns.  Fetal movement and labor precautions reviewed.

## 2011-06-05 NOTE — Patient Instructions (Signed)
Return to clinic for any obstetric concerns or go to [facility]U for evaluation  

## 2011-06-05 NOTE — Progress Notes (Signed)
Patient is here for routine prenatal, she is doing well other than some morning sickness the last couple weeks.

## 2011-06-25 ENCOUNTER — Ambulatory Visit (INDEPENDENT_AMBULATORY_CARE_PROVIDER_SITE_OTHER): Payer: BC Managed Care – PPO | Admitting: Family Medicine

## 2011-06-25 DIAGNOSIS — Z3480 Encounter for supervision of other normal pregnancy, unspecified trimester: Secondary | ICD-10-CM

## 2011-06-25 NOTE — Patient Instructions (Addendum)
Normal Labor and Delivery Your caregiver must first be sure you are in labor. Signs of labor include:  You may pass what is called "the mucus plug" before labor begins. This is a small amount of blood stained mucus.   Regular uterine contractions.   The time between contractions get closer together.   The discomfort and pain gradually gets more intense.   Pains are mostly located in the back.   Pains get worse when walking.   The cervix (the opening of the uterus becomes thinner (begins to efface) and opens up (dilates).  Once you are in labor and admitted into the hospital or care center, your caregiver will do the following:  A complete physical examination.   Check your vital signs (blood pressure, pulse, temperature and the fetal heart rate).   Do a vaginal examination (using a sterile glove and lubricant) to determine:   The position (presentation) of the baby (head [vertex] or buttock first).   The level (station) of the baby's head in the birth canal.   The effacement and dilatation of the cervix.   You may have your pubic hair shaved and be given an enema depending on your caregiver and the circumstance.   An electronic monitor is usually placed on your abdomen. The monitor follows the length and intensity of the contractions, as well as the baby's heart rate.   Usually, your caregiver will insert an IV in your arm with a bottle of sugar water. This is done as a precaution so that medications can be given to you quickly during labor or delivery.  NORMAL LABOR AND DELIVERY IS DIVIDED UP INTO 3 STAGES: First Stage This is when regular contractions begin and the cervix begins to efface and dilate. This stage can last from 3 to 15 hours. The end of the first stage is when the cervix is 100% effaced and 10 centimeters dilated. Pain medications may be given by   Injection (morphine, demerol, etc.)   Regional anesthesia (spinal, caudal or epidural, anesthetics given in  different locations of the spine). Paracervical pain medication may be given, which is an injection of and anesthetic on each side of the cervix.  A pregnant woman may request to have "Natural Childbirth" which is not to have any medications or anesthesia during her labor and delivery. Second Stage This is when the baby comes down through the birth canal (vagina) and is born. This can take 1 to 4 hours. As the baby's head comes down through the birth canal, you may feel like you are going to have a bowel movement. You will get the urge to bear down and push until the baby is delivered. As the baby's head is being delivered, the caregiver will decide if an episiotomy (a cut in the perineum and vagina area) is needed to prevent tearing of the tissue in this area. The episiotomy is sewn up after the delivery of the baby and placenta. Sometimes a mask with nitrous oxide is given for the mother to breath during the delivery of the baby to help if there is too much pain. The end of Stage 2 is when the baby is fully delivered. Then when the umbilical cord stops pulsating it is clamped and cut. Third Stage The third stage begins after the baby is completely delivered and ends after the placenta (afterbirth) is delivered. This usually takes 5 to 30 minutes. After the placenta is delivered, a medication is given either by intravenous or injection to help contract   the uterus and prevent bleeding. The third stage is not painful and pain medication is usually not necessary. If an episiotomy was done, it is repaired at this time. After the delivery, the mother is watched and monitored closely for 1 to 2 hours to make sure there is no postpartum bleeding (hemorrhage). If there is a lot of bleeding, medication is given to contract the uterus and stop the bleeding. Document Released: 12/12/2007 Document Revised: 02/21/2011 Document Reviewed: 12/12/2007 [employer] Patient Information 2012 [employer]. Pregnancy - Third  Trimester The third trimester of pregnancy (the last 3 months) is a period of the most rapid growth for you and your baby. The baby approaches a length of 20 inches and a weight of 6 to 10 pounds. The baby is adding on fat and getting ready for life outside your body. While inside, babies have periods of sleeping and waking, suck their thumbs, and hiccups. You can often feel small contractions of the uterus. This is false labor. It is also called Braxton-Hicks contractions. This is like a practice for labor. The usual problems in this stage of pregnancy include more difficulty breathing, swelling of the hands and feet from water retention, and having to urinate more often because of the uterus and baby pressing on your bladder.  PRENATAL EXAMS  Blood work may continue to be done during prenatal exams. These tests are done to check on your health and the probable health of your baby. Blood work is used to follow your blood levels (hemoglobin). Anemia (low hemoglobin) is common during pregnancy. Iron and vitamins are given to help prevent this. You may also continue to be checked for diabetes. Some of the past blood tests may be done again.   The size of the uterus is measured during each visit. This makes sure your baby is growing properly according to your pregnancy dates.   Your blood pressure is checked every prenatal visit. This is to make sure you are not getting toxemia.   Your urine is checked every prenatal visit for infection, diabetes and protein.   Your weight is checked at each visit. This is done to make sure gains are happening at the suggested rate and that you and your baby are growing normally.   Sometimes, an ultrasound is performed to confirm the position and the proper growth and development of the baby. This is a test done that bounces harmless sound waves off the baby so your caregiver can more accurately determine due dates.   Discuss the type of pain medication and anesthesia  you will have during your labor and delivery.   Discuss the possibility and anesthesia if a Cesarean Section might be necessary.   Inform your caregiver if there is any mental or physical violence at home.  Sometimes, a specialized non-stress test, contraction stress test and biophysical profile are done to make sure the baby is not having a problem. Checking the amniotic fluid surrounding the baby is called an amniocentesis. The amniotic fluid is removed by sticking a needle into the belly (abdomen). This is sometimes done near the end of pregnancy if an early delivery is required. In this case, it is done to help make sure the baby's lungs are mature enough for the baby to live outside of the womb. If the lungs are not mature and it is unsafe to deliver the baby, an injection of cortisone medication is given to the mother 1 to 2 days before the delivery. This helps the baby's lungs mature   and makes it safer to deliver the baby. CHANGES OCCURING IN THE THIRD TRIMESTER OF PREGNANCY Your body goes through many changes during pregnancy. They vary from person to person. Talk to your caregiver about changes you notice and are concerned about.  During the last trimester, you have probably had an increase in your appetite. It is normal to have cravings for certain foods. This varies from person to person and pregnancy to pregnancy.   You may begin to get stretch marks on your hips, abdomen, and breasts. These are normal changes in the body during pregnancy. There are no exercises or medications to take which prevent this change.   Constipation may be treated with a stool softener or adding bulk to your diet. Drinking lots of fluids, fiber in vegetables, fruits, and whole grains are helpful.   Exercising is also helpful. If you have been very active up until your pregnancy, most of these activities can be continued during your pregnancy. If you have been less active, it is helpful to start an exercise  program such as walking. Consult your caregiver before starting exercise programs.   Avoid all smoking, alcohol, un-prescribed drugs, herbs and "street drugs" during your pregnancy. These chemicals affect the formation and growth of the baby. Avoid chemicals throughout the pregnancy to ensure the delivery of a healthy infant.   Backache, varicose veins and hemorrhoids may develop or get worse.   You will tire more easily in the third trimester, which is normal.   The baby's movements may be stronger and more often.   You may become short of breath easily.   Your belly button may stick out.   A yellow discharge may leak from your breasts called colostrum.   You may have a bloody mucus discharge. This usually occurs a few days to a week before labor begins.  HOME CARE INSTRUCTIONS   Keep your caregiver's appointments. Follow your caregiver's instructions regarding medication use, exercise, and diet.   During pregnancy, you are providing food for you and your baby. Continue to eat regular, well-balanced meals. Choose foods such as meat, fish, milk and other low fat dairy products, vegetables, fruits, and whole-grain breads and cereals. Your caregiver will tell you of the ideal weight gain.   A physical sexual relationship may be continued throughout pregnancy if there are no other problems such as early (premature) leaking of amniotic fluid from the membranes, vaginal bleeding, or belly (abdominal) pain.   Exercise regularly if there are no restrictions. Check with your caregiver if you are unsure of the safety of your exercises. Greater weight gain will occur in the last 2 trimesters of pregnancy. Exercising helps:   Control your weight.   Get you in shape for labor and delivery.   You lose weight after you deliver.   Rest a lot with legs elevated, or as needed for leg cramps or low back pain.   Wear a good support or jogging bra for breast tenderness during pregnancy. This may help  if worn during sleep. Pads or tissues may be used in the bra if you are leaking colostrum.   Do not use hot tubs, steam rooms, or saunas.   Wear your seat belt when driving. This protects you and your baby if you are in an accident.   Avoid raw meat, cat litter boxes and soil used by cats. These carry germs that can cause birth defects in the baby.   It is easier to loose urine during pregnancy. Tightening up   and strengthening the pelvic muscles will help with this problem. You can practice stopping your urination while you are going to the bathroom. These are the same muscles you need to strengthen. It is also the muscles you would use if you were trying to stop from passing gas. You can practice tightening these muscles up 10 times a set and repeating this about 3 times per day. Once you know what muscles to tighten up, do not perform these exercises during urination. It is more likely to cause an infection by backing up the urine.   Ask for help if you have financial, counseling or nutritional needs during pregnancy. Your caregiver will be able to offer counseling for these needs as well as refer you for other special needs.   Make a list of emergency phone numbers and have them available.   Plan on getting help from family or friends when you go home from the hospital.   Make a trial run to the hospital.   Take prenatal classes with the father to understand, practice and ask questions about the labor and delivery.   Prepare the baby's room/nursery.   Do not travel out of the city unless it is absolutely necessary and with the advice of your caregiver.   Wear only low or no heal shoes to have better balance and prevent falling.  MEDICATIONS AND DRUG USE IN PREGNANCY  Take prenatal vitamins as directed. The vitamin should contain 1 milligram of folic acid. Keep all vitamins out of reach of children. Only a couple vitamins or tablets containing iron may be fatal to a baby or young child  when ingested.   Avoid use of all medications, including herbs, over-the-counter medications, not prescribed or suggested by your caregiver. Only take over-the-counter or prescription medicines for pain, discomfort, or fever as directed by your caregiver. Do not use aspirin, ibuprofen (Motrin, Advil, Nuprin) or naproxen (Aleve) unless OK'd by your caregiver.   Let your caregiver also know about herbs you may be using.   Alcohol is related to a number of birth defects. This includes fetal alcohol syndrome. All alcohol, in any form, should be avoided completely. Smoking will cause low birth rate and premature babies.   Street/illegal drugs are very harmful to the baby. They are absolutely forbidden. A baby born to an addicted mother will be addicted at birth. The baby will go through the same withdrawal an adult does.  SEEK MEDICAL CARE IF: You have any concerns or worries during your pregnancy. It is better to call with your questions if you feel they cannot wait, rather than worry about them. DECISIONS ABOUT CIRCUMCISION You may or may not know the sex of your baby. If you know your baby is a boy, it may be time to think about circumcision. Circumcision is the removal of the foreskin of the penis. This is the skin that covers the sensitive end of the penis. There is no proven medical need for this. Often this decision is made on what is popular at the time or based upon religious beliefs and social issues. You can discuss these issues with your caregiver or pediatrician. SEEK IMMEDIATE MEDICAL CARE IF:   An unexplained oral temperature above 102 F (38.9 C) develops, or as your caregiver suggests.   You have leaking of fluid from the vagina (birth canal). If leaking membranes are suspected, take your temperature and tell your caregiver of this when you call.   There is vaginal spotting, bleeding or passing clots.   Tell your caregiver of the amount and how many pads are used.   You develop a  bad smelling vaginal discharge with a change in the color from clear to white.   You develop vomiting that lasts more than 24 hours.   You develop chills or fever.   You develop shortness of breath.   You develop burning on urination.   You loose more than 2 pounds of weight or gain more than 2 pounds of weight or as suggested by your caregiver.   You notice sudden swelling of your face, hands, and feet or legs.   You develop belly (abdominal) pain. Round ligament discomfort is a common non-cancerous (benign) cause of abdominal pain in pregnancy. Your caregiver still must evaluate you.   You develop a severe headache that does not go away.   You develop visual problems, blurred or double vision.   If you have not felt your baby move for more than 1 hour. If you think the baby is not moving as much as usual, eat something with sugar in it and lie down on your left side for an hour. The baby should move at least 4 to 5 times per hour. Call right away if your baby moves less than that.   You fall, are in a car accident or any kind of trauma.   There is mental or physical violence at home.  Document Released: 02/26/2001 Document Revised: 02/21/2011 Document Reviewed: 08/31/2008 [employer] Patient Information 2012 [employer]. Breastfeeding BENEFITS OF BREASTFEEDING For the baby  The first milk (colostrum) helps the baby's digestive system function better.   There are antibodies from the mother in the milk that help the baby fight off infections.   The baby has a lower incidence of asthma, allergies, and SIDS (sudden infant death syndrome).   The nutrients in breast milk are better than formulas for the baby and helps the baby's brain grow better.   Babies who breastfeed have less gas, colic, and constipation.  For the mother  Breastfeeding helps develop a very special bond between mother and baby.   It is more convenient, always available at the correct temperature and  cheaper than formula feeding.   It burns calories in the mother and helps with losing weight that was gained during pregnancy.   It makes the uterus contract back down to normal size faster and slows bleeding following delivery.   Breastfeeding mothers have a lower risk of developing breast cancer.  NURSE FREQUENTLY  A healthy, full-term baby may breastfeed as often as every hour or space his or her feedings to every 3 hours.   How often to nurse will vary from baby to baby. Watch your baby for signs of hunger, not the clock.   Nurse as often as the baby requests, or when you feel the need to reduce the fullness of your breasts.   Awaken the baby if it has been 3 to 4 hours since the last feeding.   Frequent feeding will help the mother make more milk and will prevent problems like sore nipples and engorgement of the breasts.  BABY'S POSITION AT THE BREAST  Whether lying down or sitting, be sure that the baby's tummy is facing your tummy.   Support the breast with 4 fingers underneath the breast and the thumb above. Make sure your fingers are well away from the nipple and baby's mouth.   Stroke the baby's lips and cheek closest to the breast gently with your finger or   nipple.   When the baby's mouth is open wide enough, place all of your nipple and as much of the dark area around the nipple as possible into your baby's mouth.   Pull the baby in close so the tip of the nose and the baby's cheeks touch the breast during the feeding.  FEEDINGS  The length of each feeding varies from baby to baby and from feeding to feeding.   The baby must suck about 2 to 3 minutes for your milk to get to him or her. This is called a "let down." For this reason, allow the baby to feed on each breast as long as he or she wants. Your baby will end the feeding when he or she has received the right balance of nutrients.   To break the suction, put your finger into the corner of the baby's mouth and slide  it between his or her gums before removing your breast from his or her mouth. This will help prevent sore nipples.  REDUCING BREAST ENGORGEMENT  In the first week after your baby is born, you may experience signs of breast engorgement. When breasts are engorged, they feel heavy, warm, full, and may be tender to the touch. You can reduce engorgement if you:   Nurse frequently, every 2 to 3 hours. Mothers who breastfeed early and often have fewer problems with engorgement.   Place light ice packs on your breasts between feedings. This reduces swelling. Wrap the ice packs in a lightweight towel to protect your skin.   Apply moist hot packs to your breast for 5 to 10 minutes before each feeding. This increases circulation and helps the milk flow.   Gently massage your breast before and during the feeding.   Make sure that the baby empties at least one breast at every feeding before switching sides.   Use a breast pump to empty the breasts if your baby is sleepy or not nursing well. You may also want to pump if you are returning to work or or you feel you are getting engorged.   Avoid bottle feeds, pacifiers or supplemental feedings of water or juice in place of breastfeeding.   Be sure the baby is latched on and positioned properly while breastfeeding.   Prevent fatigue, stress, and anemia.   Wear a supportive bra, avoiding underwire styles.   Eat a balanced diet with enough fluids.  If you follow these suggestions, your engorgement should improve in 24 to 48 hours. If you are still experiencing difficulty, call your lactation consultant or caregiver. IS MY BABY GETTING ENOUGH MILK? Sometimes, mothers worry about whether their babies are getting enough milk. You can be assured that your baby is getting enough milk if:  The baby is actively sucking and you hear swallowing.   The baby nurses at least 8 to 12 times in a 24 hour time period. Nurse your baby until he or she unlatches or falls  asleep at the first breast (at least 10 to 20 minutes), then offer the second side.   The baby is wetting 5 to 6 disposable diapers (6 to 8 cloth diapers) in a 24 hour period by 5 to 6 days of age.   The baby is having at least 2 to 3 stools every 24 hours for the first few months. Breast milk is all the food your baby needs. It is not necessary for your baby to have water or formula. In fact, to help your breasts make more milk,   it is best not to give your baby supplemental feedings during the early weeks.   The stool should be soft and yellow.   The baby should gain 4 to 7 ounces per week after he is 4 days old.  TAKE CARE OF YOURSELF Take care of your breasts by:  Bathing or showering daily.   Avoiding the use of soaps on your nipples.   Start feedings on your left breast at one feeding and on your right breast at the next feeding.   You will notice an increase in your milk supply 2 to 5 days after delivery. You may feel some discomfort from engorgement, which makes your breasts very firm and often tender. Engorgement "peaks" out within 24 to 48 hours. In the meantime, apply warm moist towels to your breasts for 5 to 10 minutes before feeding. Gentle massage and expression of some milk before feeding will soften your breasts, making it easier for your baby to latch on. Wear a well fitting nursing bra and air dry your nipples for 10 to 15 minutes after each feeding.   Only use cotton bra pads.   Only use pure lanolin on your nipples after nursing. You do not need to wash it off before nursing.  Take care of yourself by:   Eating well-balanced meals and nutritious snacks.   Drinking milk, fruit juice, and water to satisfy your thirst (about 8 glasses a day).   Getting plenty of rest.   Increasing calcium in your diet (1200 mg a day).   Avoiding foods that you notice affect the baby in a bad way.  SEEK MEDICAL CARE IF:   You have any questions or difficulty with breastfeeding.    You need help.   You have a hard, red, sore area on your breast, accompanied by a fever of 100.5 F (38.1 C) or more.   Your baby is too sleepy to eat well or is having trouble sleeping.   Your baby is wetting less than 6 diapers per day, by 5 days of age.   Your baby's skin or white part of his or her eyes is more yellow than it was in the hospital.   You feel depressed.  Document Released: 03/04/2005 Document Revised: 02/21/2011 Document Reviewed: 10/17/2008 [employer] Patient Information 2012 [employer]. 

## 2011-06-25 NOTE — Progress Notes (Signed)
Doing well---cultures at next visit.

## 2011-07-03 ENCOUNTER — Ambulatory Visit (INDEPENDENT_AMBULATORY_CARE_PROVIDER_SITE_OTHER): Payer: BC Managed Care – PPO | Admitting: Obstetrics & Gynecology

## 2011-07-03 VITALS — BP 117/96 | Wt 164.0 lb

## 2011-07-03 DIAGNOSIS — Z3400 Encounter for supervision of normal first pregnancy, unspecified trimester: Secondary | ICD-10-CM

## 2011-07-03 LAB — OB RESULTS CONSOLE GBS: GBS: NEGATIVE

## 2011-07-03 NOTE — Progress Notes (Signed)
Cultures done today. No other complaints or concerns.  Fetal movement and labor precautions reviewed.   

## 2011-07-03 NOTE — Patient Instructions (Signed)
Return to clinic for any obstetric concerns or go to [facility]U for evaluation  

## 2011-07-04 LAB — GC/CHLAMYDIA PROBE AMP, GENITAL
Chlamydia, DNA Probe: NEGATIVE
GC Probe Amp, Genital: NEGATIVE

## 2011-07-06 LAB — CULTURE, BETA STREP (GROUP B ONLY)

## 2011-07-08 ENCOUNTER — Encounter: Payer: Self-pay | Admitting: Obstetrics & Gynecology

## 2011-07-11 ENCOUNTER — Ambulatory Visit (INDEPENDENT_AMBULATORY_CARE_PROVIDER_SITE_OTHER): Payer: BC Managed Care – PPO | Admitting: Family Medicine

## 2011-07-11 DIAGNOSIS — Z3480 Encounter for supervision of other normal pregnancy, unspecified trimester: Secondary | ICD-10-CM

## 2011-07-11 NOTE — Progress Notes (Signed)
Cultures are negative Doing well Labor precautions

## 2011-07-11 NOTE — Patient Instructions (Addendum)
Normal Labor and Delivery Your caregiver must first be sure you are in labor. Signs of labor include:  You may pass what is called "the mucus plug" before labor begins. This is a small amount of blood stained mucus.   Regular uterine contractions.   The time between contractions get closer together.   The discomfort and pain gradually gets more intense.   Pains are mostly located in the back.   Pains get worse when walking.   The cervix (the opening of the uterus becomes thinner (begins to efface) and opens up (dilates).  Once you are in labor and admitted into the hospital or care center, your caregiver will do the following:  A complete physical examination.   Check your vital signs (blood pressure, pulse, temperature and the fetal heart rate).   Do a vaginal examination (using a sterile glove and lubricant) to determine:   The position (presentation) of the baby (head [vertex] or buttock first).   The level (station) of the baby's head in the birth canal.   The effacement and dilatation of the cervix.   You may have your pubic hair shaved and be given an enema depending on your caregiver and the circumstance.   An electronic monitor is usually placed on your abdomen. The monitor follows the length and intensity of the contractions, as well as the baby's heart rate.   Usually, your caregiver will insert an IV in your arm with a bottle of sugar water. This is done as a precaution so that medications can be given to you quickly during labor or delivery.  NORMAL LABOR AND DELIVERY IS DIVIDED UP INTO 3 STAGES: First Stage This is when regular contractions begin and the cervix begins to efface and dilate. This stage can last from 3 to 15 hours. The end of the first stage is when the cervix is 100% effaced and 10 centimeters dilated. Pain medications may be given by   Injection (morphine, demerol, etc.)   Regional anesthesia (spinal, caudal or epidural, anesthetics given in  different locations of the spine). Paracervical pain medication may be given, which is an injection of and anesthetic on each side of the cervix.  A pregnant woman may request to have "Natural Childbirth" which is not to have any medications or anesthesia during her labor and delivery. Second Stage This is when the baby comes down through the birth canal (vagina) and is born. This can take 1 to 4 hours. As the baby's head comes down through the birth canal, you may feel like you are going to have a bowel movement. You will get the urge to bear down and push until the baby is delivered. As the baby's head is being delivered, the caregiver will decide if an episiotomy (a cut in the perineum and vagina area) is needed to prevent tearing of the tissue in this area. The episiotomy is sewn up after the delivery of the baby and placenta. Sometimes a mask with nitrous oxide is given for the mother to breath during the delivery of the baby to help if there is too much pain. The end of Stage 2 is when the baby is fully delivered. Then when the umbilical cord stops pulsating it is clamped and cut. Third Stage The third stage begins after the baby is completely delivered and ends after the placenta (afterbirth) is delivered. This usually takes 5 to 30 minutes. After the placenta is delivered, a medication is given either by intravenous or injection to help contract   the uterus and prevent bleeding. The third stage is not painful and pain medication is usually not necessary. If an episiotomy was done, it is repaired at this time. After the delivery, the mother is watched and monitored closely for 1 to 2 hours to make sure there is no postpartum bleeding (hemorrhage). If there is a lot of bleeding, medication is given to contract the uterus and stop the bleeding. Document Released: 12/12/2007 Document Revised: 02/21/2011 Document Reviewed: 12/12/2007 Exit[employer] Patient Information 2012 [employer]. Breastfeeding BENEFITS OF BREASTFEEDING For the baby  The first milk (colostrum) helps the baby's digestive system function better.   There are antibodies from the mother in the milk that help the baby fight off infections.   The baby has a lower incidence of asthma, allergies, and SIDS (sudden infant death syndrome).   The nutrients in breast milk are better than formulas for the baby and helps the baby's brain grow better.   Babies who breastfeed have less gas, colic, and constipation.  For the mother  Breastfeeding helps develop a very special bond between mother and baby.   It is more convenient, always available at the correct temperature and cheaper than formula feeding.   It burns calories in the mother and helps with losing weight that was gained during pregnancy.   It makes the uterus contract back down to normal size faster and slows bleeding following delivery.   Breastfeeding mothers have a lower risk of developing breast cancer.  NURSE FREQUENTLY  A healthy, full-term baby may breastfeed as often as every hour or space his or her feedings to every 3 hours.   How often to nurse will vary from baby to baby. Watch your baby for signs of hunger, not the clock.   Nurse as often as the baby requests, or when you feel the need to reduce the fullness of your breasts.   Awaken the baby if it has been 3 to 4 hours since the last feeding.   Frequent feeding will help the mother make more milk and will prevent problems like sore nipples and engorgement of the breasts.  BABY'S POSITION AT THE BREAST  Whether lying down or sitting, be sure that the baby's tummy is facing your tummy.   Support the breast with 4 fingers underneath the breast and the thumb above. Make sure your fingers are well away from the nipple and baby's mouth.   Stroke the baby's lips and cheek closest to the breast gently with your finger or nipple.   When the baby's mouth is open wide enough,  place all of your nipple and as much of the dark area around the nipple as possible into your baby's mouth.   Pull the baby in close so the tip of the nose and the baby's cheeks touch the breast during the feeding.  FEEDINGS  The length of each feeding varies from baby to baby and from feeding to feeding.   The baby must suck about 2 to 3 minutes for your milk to get to him or her. This is called a "let down." For this reason, allow the baby to feed on each breast as long as he or she wants. Your baby will end the feeding when he or she has received the right balance of nutrients.   To break the suction, put your finger into the corner of the baby's mouth and slide it between his or her gums before removing your breast from his or her mouth. This will   help prevent sore nipples.  REDUCING BREAST ENGORGEMENT  In the first week after your baby is born, you may experience signs of breast engorgement. When breasts are engorged, they feel heavy, warm, full, and may be tender to the touch. You can reduce engorgement if you:   Nurse frequently, every 2 to 3 hours. Mothers who breastfeed early and often have fewer problems with engorgement.   Place light ice packs on your breasts between feedings. This reduces swelling. Wrap the ice packs in a lightweight towel to protect your skin.   Apply moist hot packs to your breast for 5 to 10 minutes before each feeding. This increases circulation and helps the milk flow.   Gently massage your breast before and during the feeding.   Make sure that the baby empties at least one breast at every feeding before switching sides.   Use a breast pump to empty the breasts if your baby is sleepy or not nursing well. You may also want to pump if you are returning to work or or you feel you are getting engorged.   Avoid bottle feeds, pacifiers or supplemental feedings of water or juice in place of breastfeeding.   Be sure the baby is latched on and positioned properly  while breastfeeding.   Prevent fatigue, stress, and anemia.   Wear a supportive bra, avoiding underwire styles.   Eat a balanced diet with enough fluids.  If you follow these suggestions, your engorgement should improve in 24 to 48 hours. If you are still experiencing difficulty, call your lactation consultant or caregiver. IS MY BABY GETTING ENOUGH MILK? Sometimes, mothers worry about whether their babies are getting enough milk. You can be assured that your baby is getting enough milk if:  The baby is actively sucking and you hear swallowing.   The baby nurses at least 8 to 12 times in a 24 hour time period. Nurse your baby until he or she unlatches or falls asleep at the first breast (at least 10 to 20 minutes), then offer the second side.   The baby is wetting 5 to 6 disposable diapers (6 to 8 cloth diapers) in a 24 hour period by 5 to 6 days of age.   The baby is having at least 2 to 3 stools every 24 hours for the first few months. Breast milk is all the food your baby needs. It is not necessary for your baby to have water or formula. In fact, to help your breasts make more milk, it is best not to give your baby supplemental feedings during the early weeks.   The stool should be soft and yellow.   The baby should gain 4 to 7 ounces per week after he is 4 days old.  TAKE CARE OF YOURSELF Take care of your breasts by:  Bathing or showering daily.   Avoiding the use of soaps on your nipples.   Start feedings on your left breast at one feeding and on your right breast at the next feeding.   You will notice an increase in your milk supply 2 to 5 days after delivery. You may feel some discomfort from engorgement, which makes your breasts very firm and often tender. Engorgement "peaks" out within 24 to 48 hours. In the meantime, apply warm moist towels to your breasts for 5 to 10 minutes before feeding. Gentle massage and expression of some milk before feeding will soften your breasts,  making it easier for your baby to latch on. Wear a   well fitting nursing bra and air dry your nipples for 10 to 15 minutes after each feeding.   Only use cotton bra pads.   Only use pure lanolin on your nipples after nursing. You do not need to wash it off before nursing.  Take care of yourself by:   Eating well-balanced meals and nutritious snacks.   Drinking milk, fruit juice, and water to satisfy your thirst (about 8 glasses a day).   Getting plenty of rest.   Increasing calcium in your diet (1200 mg a day).   Avoiding foods that you notice affect the baby in a bad way.  SEEK MEDICAL CARE IF:   You have any questions or difficulty with breastfeeding.   You need help.   You have a hard, red, sore area on your breast, accompanied by a fever of 100.5 F (38.1 C) or more.   Your baby is too sleepy to eat well or is having trouble sleeping.   Your baby is wetting less than 6 diapers per day, by 5 days of age.   Your baby's skin or white part of his or her eyes is more yellow than it was in the hospital.   You feel depressed.  Document Released: 03/04/2005 Document Revised: 02/21/2011 Document Reviewed: 10/17/2008 Exit[employer] Patient Information 2012 [employer]. 

## 2011-07-17 ENCOUNTER — Ambulatory Visit (INDEPENDENT_AMBULATORY_CARE_PROVIDER_SITE_OTHER): Payer: BC Managed Care – PPO | Admitting: Family Medicine

## 2011-07-17 DIAGNOSIS — Z3480 Encounter for supervision of other normal pregnancy, unspecified trimester: Secondary | ICD-10-CM

## 2011-07-17 NOTE — Patient Instructions (Addendum)
Normal Labor and Delivery Your caregiver must first be sure you are in labor. Signs of labor include:  You may pass what is called "the mucus plug" before labor begins. This is a small amount of blood stained mucus.   Regular uterine contractions.   The time between contractions get closer together.   The discomfort and pain gradually gets more intense.   Pains are mostly located in the back.   Pains get worse when walking.   The cervix (the opening of the uterus becomes thinner (begins to efface) and opens up (dilates).  Once you are in labor and admitted into the hospital or care center, your caregiver will do the following:  A complete physical examination.   Check your vital signs (blood pressure, pulse, temperature and the fetal heart rate).   Do a vaginal examination (using a sterile glove and lubricant) to determine:   The position (presentation) of the baby (head [vertex] or buttock first).   The level (station) of the baby's head in the birth canal.   The effacement and dilatation of the cervix.   You may have your pubic hair shaved and be given an enema depending on your caregiver and the circumstance.   An electronic monitor is usually placed on your abdomen. The monitor follows the length and intensity of the contractions, as well as the baby's heart rate.   Usually, your caregiver will insert an IV in your arm with a bottle of sugar water. This is done as a precaution so that medications can be given to you quickly during labor or delivery.  NORMAL LABOR AND DELIVERY IS DIVIDED UP INTO 3 STAGES: First Stage This is when regular contractions begin and the cervix begins to efface and dilate. This stage can last from 3 to 15 hours. The end of the first stage is when the cervix is 100% effaced and 10 centimeters dilated. Pain medications may be given by   Injection (morphine, demerol, etc.)   Regional anesthesia (spinal, caudal or epidural, anesthetics given in  different locations of the spine). Paracervical pain medication may be given, which is an injection of and anesthetic on each side of the cervix.  A pregnant woman may request to have "Natural Childbirth" which is not to have any medications or anesthesia during her labor and delivery. Second Stage This is when the baby comes down through the birth canal (vagina) and is born. This can take 1 to 4 hours. As the baby's head comes down through the birth canal, you may feel like you are going to have a bowel movement. You will get the urge to bear down and push until the baby is delivered. As the baby's head is being delivered, the caregiver will decide if an episiotomy (a cut in the perineum and vagina area) is needed to prevent tearing of the tissue in this area. The episiotomy is sewn up after the delivery of the baby and placenta. Sometimes a mask with nitrous oxide is given for the mother to breath during the delivery of the baby to help if there is too much pain. The end of Stage 2 is when the baby is fully delivered. Then when the umbilical cord stops pulsating it is clamped and cut. Third Stage The third stage begins after the baby is completely delivered and ends after the placenta (afterbirth) is delivered. This usually takes 5 to 30 minutes. After the placenta is delivered, a medication is given either by intravenous or injection to help contract   the uterus and prevent bleeding. The third stage is not painful and pain medication is usually not necessary. If an episiotomy was done, it is repaired at this time. After the delivery, the mother is watched and monitored closely for 1 to 2 hours to make sure there is no postpartum bleeding (hemorrhage). If there is a lot of bleeding, medication is given to contract the uterus and stop the bleeding. Document Released: 12/12/2007 Document Revised: 02/21/2011 Document Reviewed: 12/12/2007 Exit[employer] Patient Information 2012 [employer]. Breastfeeding BENEFITS OF BREASTFEEDING For the baby  The first milk (colostrum) helps the baby's digestive system function better.   There are antibodies from the mother in the milk that help the baby fight off infections.   The baby has a lower incidence of asthma, allergies, and SIDS (sudden infant death syndrome).   The nutrients in breast milk are better than formulas for the baby and helps the baby's brain grow better.   Babies who breastfeed have less gas, colic, and constipation.  For the mother  Breastfeeding helps develop a very special bond between mother and baby.   It is more convenient, always available at the correct temperature and cheaper than formula feeding.   It burns calories in the mother and helps with losing weight that was gained during pregnancy.   It makes the uterus contract back down to normal size faster and slows bleeding following delivery.   Breastfeeding mothers have a lower risk of developing breast cancer.  NURSE FREQUENTLY  A healthy, full-term baby may breastfeed as often as every hour or space his or her feedings to every 3 hours.   How often to nurse will vary from baby to baby. Watch your baby for signs of hunger, not the clock.   Nurse as often as the baby requests, or when you feel the need to reduce the fullness of your breasts.   Awaken the baby if it has been 3 to 4 hours since the last feeding.   Frequent feeding will help the mother make more milk and will prevent problems like sore nipples and engorgement of the breasts.  BABY'S POSITION AT THE BREAST  Whether lying down or sitting, be sure that the baby's tummy is facing your tummy.   Support the breast with 4 fingers underneath the breast and the thumb above. Make sure your fingers are well away from the nipple and baby's mouth.   Stroke the baby's lips and cheek closest to the breast gently with your finger or nipple.   When the baby's mouth is open wide enough,  place all of your nipple and as much of the dark area around the nipple as possible into your baby's mouth.   Pull the baby in close so the tip of the nose and the baby's cheeks touch the breast during the feeding.  FEEDINGS  The length of each feeding varies from baby to baby and from feeding to feeding.   The baby must suck about 2 to 3 minutes for your milk to get to him or her. This is called a "let down." For this reason, allow the baby to feed on each breast as long as he or she wants. Your baby will end the feeding when he or she has received the right balance of nutrients.   To break the suction, put your finger into the corner of the baby's mouth and slide it between his or her gums before removing your breast from his or her mouth. This will   help prevent sore nipples.  REDUCING BREAST ENGORGEMENT  In the first week after your baby is born, you may experience signs of breast engorgement. When breasts are engorged, they feel heavy, warm, full, and may be tender to the touch. You can reduce engorgement if you:   Nurse frequently, every 2 to 3 hours. Mothers who breastfeed early and often have fewer problems with engorgement.   Place light ice packs on your breasts between feedings. This reduces swelling. Wrap the ice packs in a lightweight towel to protect your skin.   Apply moist hot packs to your breast for 5 to 10 minutes before each feeding. This increases circulation and helps the milk flow.   Gently massage your breast before and during the feeding.   Make sure that the baby empties at least one breast at every feeding before switching sides.   Use a breast pump to empty the breasts if your baby is sleepy or not nursing well. You may also want to pump if you are returning to work or or you feel you are getting engorged.   Avoid bottle feeds, pacifiers or supplemental feedings of water or juice in place of breastfeeding.   Be sure the baby is latched on and positioned properly  while breastfeeding.   Prevent fatigue, stress, and anemia.   Wear a supportive bra, avoiding underwire styles.   Eat a balanced diet with enough fluids.  If you follow these suggestions, your engorgement should improve in 24 to 48 hours. If you are still experiencing difficulty, call your lactation consultant or caregiver. IS MY BABY GETTING ENOUGH MILK? Sometimes, mothers worry about whether their babies are getting enough milk. You can be assured that your baby is getting enough milk if:  The baby is actively sucking and you hear swallowing.   The baby nurses at least 8 to 12 times in a 24 hour time period. Nurse your baby until he or she unlatches or falls asleep at the first breast (at least 10 to 20 minutes), then offer the second side.   The baby is wetting 5 to 6 disposable diapers (6 to 8 cloth diapers) in a 24 hour period by 5 to 6 days of age.   The baby is having at least 2 to 3 stools every 24 hours for the first few months. Breast milk is all the food your baby needs. It is not necessary for your baby to have water or formula. In fact, to help your breasts make more milk, it is best not to give your baby supplemental feedings during the early weeks.   The stool should be soft and yellow.   The baby should gain 4 to 7 ounces per week after he is 4 days old.  TAKE CARE OF YOURSELF Take care of your breasts by:  Bathing or showering daily.   Avoiding the use of soaps on your nipples.   Start feedings on your left breast at one feeding and on your right breast at the next feeding.   You will notice an increase in your milk supply 2 to 5 days after delivery. You may feel some discomfort from engorgement, which makes your breasts very firm and often tender. Engorgement "peaks" out within 24 to 48 hours. In the meantime, apply warm moist towels to your breasts for 5 to 10 minutes before feeding. Gentle massage and expression of some milk before feeding will soften your breasts,  making it easier for your baby to latch on. Wear a   well fitting nursing bra and air dry your nipples for 10 to 15 minutes after each feeding.   Only use cotton bra pads.   Only use pure lanolin on your nipples after nursing. You do not need to wash it off before nursing.  Take care of yourself by:   Eating well-balanced meals and nutritious snacks.   Drinking milk, fruit juice, and water to satisfy your thirst (about 8 glasses a day).   Getting plenty of rest.   Increasing calcium in your diet (1200 mg a day).   Avoiding foods that you notice affect the baby in a bad way.  SEEK MEDICAL CARE IF:   You have any questions or difficulty with breastfeeding.   You need help.   You have a hard, red, sore area on your breast, accompanied by a fever of 100.5 F (38.1 C) or more.   Your baby is too sleepy to eat well or is having trouble sleeping.   Your baby is wetting less than 6 diapers per day, by 5 days of age.   Your baby's skin or white part of his or her eyes is more yellow than it was in the hospital.   You feel depressed.  Document Released: 03/04/2005 Document Revised: 02/21/2011 Document Reviewed: 10/17/2008 Exit[employer] Patient Information 2012 [employer]. 

## 2011-07-17 NOTE — Progress Notes (Signed)
Here for routine prenatal visit.  Doing well.

## 2011-07-17 NOTE — Progress Notes (Signed)
Doing well--good fetal movement.

## 2011-07-23 ENCOUNTER — Ambulatory Visit (HOSPITAL_COMMUNITY)
Admission: RE | Admit: 2011-07-23 | Discharge: 2011-07-23 | Disposition: A | Discharge status: Home / Self Care | Payer: BC Managed Care – PPO | Source: Ambulatory Visit | Attending: Obstetrics & Gynecology | Admitting: Obstetrics & Gynecology

## 2011-07-23 ENCOUNTER — Ambulatory Visit (INDEPENDENT_AMBULATORY_CARE_PROVIDER_SITE_OTHER): Payer: BC Managed Care – PPO | Admitting: Obstetrics & Gynecology

## 2011-07-23 VITALS — BP 127/81 | HR 83 | Wt 167.0 lb

## 2011-07-23 VITALS — BP 114/93 | Wt 165.0 lb

## 2011-07-23 DIAGNOSIS — O133 Gestational [pregnancy-induced] hypertension without significant proteinuria, third trimester: Secondary | ICD-10-CM

## 2011-07-23 DIAGNOSIS — Z3400 Encounter for supervision of normal first pregnancy, unspecified trimester: Secondary | ICD-10-CM

## 2011-07-23 DIAGNOSIS — O139 Gestational [pregnancy-induced] hypertension without significant proteinuria, unspecified trimester: Secondary | ICD-10-CM

## 2011-07-23 DIAGNOSIS — O358XX0 Maternal care for other (suspected) fetal abnormality and damage, not applicable or unspecified: Secondary | ICD-10-CM | POA: Insufficient documentation

## 2011-07-23 DIAGNOSIS — O0990 Supervision of high risk pregnancy, unspecified, unspecified trimester: Secondary | ICD-10-CM

## 2011-07-23 NOTE — Patient Instructions (Signed)

## 2011-07-23 NOTE — Progress Notes (Signed)
DBP>11mmHg for three visits now, meets criteria for GHTN no elevation in SBP. Denies HA, RUQ pain and visual symptoms. Reports decreased FM.  NST done today, reactive. Membranes stripped during cervical exam.  Will check GHTN labs, given 24 hour urine collection supplies.  Will also send to hospital for growth scan and BPP.  No other complaints or concerns.  Preeclampsia, fetal movement and labor precautions reviewed. Patient education pamphlets given to patient to review at home. Will likely schedule for IOL at 40 weeks if no spontaneous labor.

## 2011-07-24 ENCOUNTER — Inpatient Hospital Stay (HOSPITAL_COMMUNITY): Admission: RE | Admit: 2011-07-24 | Payer: BC Managed Care – PPO | Source: Ambulatory Visit

## 2011-07-24 ENCOUNTER — Encounter (HOSPITAL_COMMUNITY): Payer: Self-pay

## 2011-07-24 ENCOUNTER — Inpatient Hospital Stay (HOSPITAL_COMMUNITY)
Admission: AD | Admit: 2011-07-24 | Discharge: 2011-07-27 | DRG: 372 | Disposition: A | Discharge status: Home / Self Care | Payer: BC Managed Care – PPO | Source: Ambulatory Visit | Attending: Obstetrics & Gynecology | Admitting: Obstetrics & Gynecology

## 2011-07-24 ENCOUNTER — Telehealth: Payer: Self-pay | Admitting: *Deleted

## 2011-07-24 ENCOUNTER — Encounter: Payer: Self-pay | Admitting: Obstetrics & Gynecology

## 2011-07-24 DIAGNOSIS — O0990 Supervision of high risk pregnancy, unspecified, unspecified trimester: Secondary | ICD-10-CM

## 2011-07-24 DIAGNOSIS — O133 Gestational [pregnancy-induced] hypertension without significant proteinuria, third trimester: Secondary | ICD-10-CM

## 2011-07-24 DIAGNOSIS — O139 Gestational [pregnancy-induced] hypertension without significant proteinuria, unspecified trimester: Principal | ICD-10-CM | POA: Diagnosis present

## 2011-07-24 LAB — COMPREHENSIVE METABOLIC PANEL
ALT: 12 U/L (ref 0–35)
ALT: 12 U/L (ref 0–35)
AST: 20 U/L (ref 0–37)
AST: 19 U/L (ref 0–37)
Albumin: 3.5 g/dL (ref 3.5–5.2)
Albumin: 2.7 g/dL — ABNORMAL LOW (ref 3.5–5.2)
Alkaline Phosphatase: 141 U/L — ABNORMAL HIGH (ref 39–117)
Alkaline Phosphatase: 137 U/L — ABNORMAL HIGH (ref 39–117)
BUN: 9 mg/dL (ref 6–23)
BUN: 7 mg/dL (ref 6–23)
CO2: 25 mEq/L (ref 19–32)
CO2: 24 mEq/L (ref 19–32)
Calcium: 8.6 mg/dL (ref 8.4–10.5)
Calcium: 8.7 mg/dL (ref 8.4–10.5)
Chloride: 104 mEq/L (ref 96–112)
Chloride: 98 mEq/L (ref 96–112)
Creat: 0.71 mg/dL (ref 0.50–1.10)
Creatinine, Ser: 0.62 mg/dL (ref 0.50–1.10)
GFR calc Af Amer: >90 mL/min (ref >90)
GFR calc non Af Amer: >90 mL/min (ref >90)
Glucose, Bld: 70 mg/dL (ref 70–99)
Glucose, Bld: 76 mg/dL (ref 70–99)
Potassium: 4.1 mEq/L (ref 3.5–5.3)
Potassium: 3.6 mEq/L (ref 3.5–5.1)
Sodium: 138 mEq/L (ref 135–145)
Sodium: 132 mEq/L — ABNORMAL LOW (ref 135–145)
Total Bilirubin: 0.4 mg/dL (ref 0.3–1.2)
Total Bilirubin: 0.3 mg/dL (ref 0.3–1.2)
Total Protein: 6.1 g/dL (ref 6.0–8.3)
Total Protein: 6.4 g/dL (ref 6.0–8.3)

## 2011-07-24 LAB — CBC
HCT: 38.7 % (ref 36.0–46.0)
HCT: 35.9 % — ABNORMAL LOW (ref 36.0–46.0)
Hemoglobin: 12.5 g/dL (ref 12.0–15.0)
Hemoglobin: 12.3 g/dL (ref 12.0–15.0)
MCH: 30.3 pg (ref 26.0–34.0)
MCH: 30.8 pg (ref 26.0–34.0)
MCHC: 32.3 g/dL (ref 30.0–36.0)
MCHC: 34.3 g/dL (ref 30.0–36.0)
MCV: 93.9 fL (ref 78.0–100.0)
MCV: 89.8 fL (ref 78.0–100.0)
Platelets: 121 10*3/uL — ABNORMAL LOW (ref 150–400)
Platelets: 123 10*3/uL — ABNORMAL LOW (ref 150–400)
RBC: 4.12 MIL/uL (ref 3.87–5.11)
RBC: 4 MIL/uL (ref 3.87–5.11)
RDW: 13.9 % (ref 11.5–15.5)
RDW: 13 % (ref 11.5–15.5)
WBC: 7.5 10*3/uL (ref 4.0–10.5)
WBC: 8.7 10*3/uL (ref 4.0–10.5)

## 2011-07-24 LAB — PROTEIN / CREATININE RATIO, URINE
Creatinine, Urine: 47.91 mg/dL
Protein Creatinine Ratio: 0.08 (ref 0.00–0.15)
Total Protein, Urine: 3.9 mg/dL

## 2011-07-24 MED ORDER — LIDOCAINE HCL (PF) 1 % IJ SOLN: 30.0000 mL | INTRAMUSCULAR | Status: DC | PRN

## 2011-07-24 MED ORDER — CITRIC ACID-SODIUM CITRATE 334-500 MG/5ML PO SOLN: 30.0000 mL | ORAL | Status: DC | PRN

## 2011-07-24 MED ORDER — HYDROXYZINE HCL 50 MG PO TABS: 50.0000 mg | ORAL_TABLET | Freq: Four times a day (QID) | ORAL | Status: DC | PRN

## 2011-07-24 MED ORDER — OXYTOCIN 20 UNITS IN LACTATED RINGERS INFUSION - SIMPLE
125.0000 mL/h | Freq: Once | INTRAVENOUS | Status: AC
  Administered 2011-07-25: 900 mL/h via INTRAVENOUS

## 2011-07-24 MED ORDER — OXYCODONE-ACETAMINOPHEN 5-325 MG PO TABS
1.0000 | ORAL_TABLET | ORAL | Status: DC | PRN
  Administered 2011-07-25: 2 via ORAL
  Filled 2011-07-24: qty 2

## 2011-07-24 MED ORDER — ACETAMINOPHEN 325 MG PO TABS: 650.0000 mg | ORAL_TABLET | ORAL | Status: DC | PRN

## 2011-07-24 MED ORDER — IBUPROFEN 600 MG PO TABS
600.0000 mg | ORAL_TABLET | Freq: Four times a day (QID) | ORAL | Status: DC | PRN
  Administered 2011-07-25: 600 mg via ORAL
  Filled 2011-07-24: qty 1

## 2011-07-24 MED ORDER — TERBUTALINE SULFATE 1 MG/ML IJ SOLN: 0.2500 mg | Freq: Once | INTRAMUSCULAR | Status: AC | PRN

## 2011-07-24 MED ORDER — OXYTOCIN BOLUS FROM INFUSION
500.0000 mL | Freq: Once | INTRAVENOUS | Status: DC
  Filled 2011-07-24: qty 500

## 2011-07-24 MED ORDER — FLEET ENEMA 7-19 GM/118ML RE ENEM: 1.0000 | ENEMA | RECTAL | Status: DC | PRN

## 2011-07-24 MED ORDER — HYDROXYZINE HCL 50 MG/ML IM SOLN: 50.0000 mg | Freq: Four times a day (QID) | INTRAMUSCULAR | Status: DC | PRN

## 2011-07-24 MED ORDER — ONDANSETRON HCL 4 MG/2ML IJ SOLN: 4.0000 mg | Freq: Four times a day (QID) | INTRAMUSCULAR | Status: DC | PRN

## 2011-07-24 MED ORDER — LACTATED RINGERS IV SOLN
INTRAVENOUS | Status: DC
  Administered 2011-07-24 – 2011-07-25 (×3): via INTRAVENOUS

## 2011-07-24 MED ORDER — ZOLPIDEM TARTRATE 10 MG PO TABS: 10.0000 mg | ORAL_TABLET | Freq: Every evening | ORAL | Status: DC | PRN

## 2011-07-24 MED ORDER — LACTATED RINGERS IV SOLN
500.0000 mL | INTRAVENOUS | Status: DC | PRN
  Administered 2011-07-24: 1000 mL via INTRAVENOUS

## 2011-07-24 MED ORDER — OXYTOCIN 20 UNITS IN LACTATED RINGERS INFUSION - SIMPLE
1.0000 m[IU]/min | INTRAVENOUS | Status: DC
  Administered 2011-07-25: 2 m[IU]/min via INTRAVENOUS
  Filled 2011-07-24: qty 1000

## 2011-07-24 NOTE — Telephone Encounter (Signed)
Spoke to patient and she will have her mom pick her up and bring her to MAU per Dr. Macon Large due to increased BP and headache.  24 hour urine was turned in today.

## 2011-07-24 NOTE — H&P (Signed)
Attestation of Attending Supervision of Advanced Practitioner: Evaluation and management procedures were performed by the [facility] Fellow/PA/CNM/NP under my supervision and collaboration. Chart reviewed, and agree with management and plan.  Jaynie Collins, M.D. 07/24/2011 7:10 PM

## 2011-07-24 NOTE — Progress Notes (Signed)
Patient came in today to drop off her 24 hr urine (07/24/11).  Her BP is 142/96.  She does have a headache but no other symptoms.  She will go home and rest and call us if there is any change in her symptoms.  Pre eclampsia precautions reviewed.

## 2011-07-24 NOTE — Progress Notes (Signed)
Given her BP today of 142/96 and headache, the concern is about preeclampsia.  She is [redacted]w[redacted]d, cervix 1.5 cm/70% effaced/-1 station yesterday.  Will proceed with induction of labor for gestational hypertension, possible preeclampsia (concerned about severe preeclampsia given her headache).  Patient informed of this recommendation, induction booked on L&D for today, Admitting at [facility] informed.

## 2011-07-24 NOTE — Progress Notes (Signed)
   Ivet Guerrieri May is a 31 y.o. G1P0 at [redacted]w[redacted]d  admitted for induction of labor due to Hypertension.  Subjective: Still "dull" headache,  No vision changes, RUQ pain  Objective: BP 130/97  Pulse 94  Temp(Src) 98.1 F (36.7 C) (Oral)  Resp 18  Ht 5\' 4"  (1.626 m)  Wt 74.844 kg (165 lb)  BMI 28.32 kg/m2  LMP 10/25/2010    FHT:  FHR: 130 bpm, variability: moderate,  accelerations:  Present,  decelerations:  Absent UC:   none SVE:   Dilation: 1.5 Effacement (%): 50 Station: -2 Exam by:: F Dishmon CNM Foley inserted and inflated with 60 cc H20 Labs: Lab Results  Component Value Date   WBC 8.7 07/24/2011   HGB 12.3 07/24/2011   HCT 35.9* 07/24/2011   MCV 89.8 07/24/2011   PLT 123* 07/24/2011   CMP, Pr/Cr ration pending Assessment / Plan: IOL for GHTN, possible preeclampsai  Labor: ripening phase Fetal Wellbeing:  Category I Pain Control:  Labor support without medications Anticipated MOD:  NSVD  CRESENZO-DISHMAN,Kellianne Ek 07/24/2011, 8:20 PM

## 2011-07-24 NOTE — H&P (Signed)
Stephanie [name] is a 31 y.o. female G1P0 with IUP at [redacted]w[redacted]d presenting for IOL for GHTN,/possible Preeclampsia. PNCare at [facility] since 8 wks  Prenatal History/Complications: Over the past few weeks pt's DBP has been >90, and now she has a persistant headache, only somewhat responsive to Fioricet..  Pt states that this is not a typical migraine for her. Past Medical History: Past Medical History  Diagnosis Date  . Anxiety   . Headache     SEVERE MIGRANES    Past Surgical History: Past Surgical History  Procedure Date  . Knee arthroscopy      X 4 ON LEFT KNEE  . Knee arthroscopy w/ oats procedure      X1 ON LEFT KNEE  . Wisdom tooth extraction      ALL 4 REMOVE    Obstetrical History: OB History    Grav Para Term Preterm Abortions TAB SAB Ect Mult Living   1               Gynecological History: OB History    Grav Para Term Preterm Abortions TAB SAB Ect Mult Living   1               Social History: History   Social History  . Marital Status: Married    Spouse Name: N/A    Number of Children: N/A  . Years of Education: N/A   Social History Main Topics  . Smoking status: Never Smoker   . Smokeless tobacco: Never Used  . Alcohol Use: No  . Drug Use: No  . Sexually Active: Not Currently   Other Topics Concern  . Not on file   Social History Narrative  . No narrative on file    Family History: Family History  Problem Relation Age of Onset  . Anxiety disorder Mother   . Alcohol abuse Father   . Mental illness Sister   . Alcohol abuse Maternal Aunt   . Mental illness Paternal Aunt   . Heart disease Maternal Grandmother     Allergies: No Known Allergies  Prescriptions prior to admission  Medication Sig Dispense Refill  . butalbital-acetaminophen-caffeine (FIORICET, ESGIC) 50-325-40 MG per tablet Take 1 tablet by mouth every 4 (four) hours as needed for pain or headache. for migraines  30 tablet  2  . Prenatal Vit-Fe Fumarate-FA (PRENATAL  MULTIVITAMIN) TABS Take 1 tablet by mouth daily.        Review of Systems - Negative except dull HA. No vision changes, RUQ pain   Blood pressure 130/97, pulse 94, temperature 98.1 F (36.7 C), temperature source Oral, resp. rate 18, last menstrual period 10/25/2010. General appearance: alert, cooperative and no distress Lungs: clear to auscultation bilaterally Heart: regular rate and rhythm Abdomen: soft, non-tender; bowel sounds normal Extremities: Homans sign is negative, no sign of DVT DTR's 2+ Presentation: cephalic Fetal monitoringBaseline: 140 bpm, Variability: Good {> 6 bpm), Accelerations: Reactive and Decelerations: Absent Uterine activityNone Dilation: 1.5 Effacement (%): 50 Station: -2 Exam by:: F Dishmon CNM   Prenatal labs: ABO, Rh: A/POS/-- (09/20 1604) Antibody: NEG (09/20 1604) Rubella:   RPR: NON REAC (02/05 0945)  HBsAg: NEGATIVE (09/20 1604)  HIV: NON REACTIVE (02/05 0945)  GBS:    1 hr Glucola 64 Genetic screening  normal Anatomy US normal except for persistant EIF  EFw 83% WITH normal fluid 07/23/11  Assessment: Cheyanne G [name] is a 31 y.o. G1P0 with an IUP at [redacted]w[redacted]d presenting for IOL for GHTN/possible preeclampsia  Plan: PRE X labs, Foley->pitocin;  MgSO4 if labs reflect preeclampsia or B/P enter severe range   CRESENZO-DISHMAN,[place] 07/24/2011, 7:04 PM

## 2011-07-25 ENCOUNTER — Encounter: Payer: BC Managed Care – PPO | Admitting: Obstetrics and Gynecology

## 2011-07-25 ENCOUNTER — Encounter (HOSPITAL_COMMUNITY): Payer: Self-pay | Admitting: *Deleted

## 2011-07-25 ENCOUNTER — Inpatient Hospital Stay (HOSPITAL_COMMUNITY): Payer: BC Managed Care – PPO | Admitting: Anesthesiology

## 2011-07-25 ENCOUNTER — Encounter (HOSPITAL_COMMUNITY): Payer: Self-pay | Admitting: Anesthesiology

## 2011-07-25 ENCOUNTER — Telehealth: Payer: Self-pay | Admitting: *Deleted

## 2011-07-25 DIAGNOSIS — O139 Gestational [pregnancy-induced] hypertension without significant proteinuria, unspecified trimester: Secondary | ICD-10-CM

## 2011-07-25 LAB — RPR: RPR Ser Ql: NONREACTIVE

## 2011-07-25 LAB — CBC
HCT: 36.7 % (ref 36.0–46.0)
Hemoglobin: 12.5 g/dL (ref 12.0–15.0)
MCH: 30.9 pg (ref 26.0–34.0)
MCHC: 34.1 g/dL (ref 30.0–36.0)
MCV: 90.8 fL (ref 78.0–100.0)
Platelets: 125 10*3/uL — ABNORMAL LOW (ref 150–400)
RBC: 4.04 MIL/uL (ref 3.87–5.11)
RDW: 13.1 % (ref 11.5–15.5)
WBC: 12.3 10*3/uL — ABNORMAL HIGH (ref 4.0–10.5)

## 2011-07-25 LAB — PROTEIN / CREATININE RATIO, URINE
Creatinine, Urine: 37.4 mg/dL
Total Protein, Urine: <3 mg/dL

## 2011-07-25 MED ORDER — ONDANSETRON HCL 4 MG/2ML IJ SOLN: 4.0000 mg | INTRAMUSCULAR | Status: DC | PRN

## 2011-07-25 MED ORDER — BENZOCAINE-MENTHOL 20-0.5 % EX AERO
1.0000 "application " | INHALATION_SPRAY | CUTANEOUS | Status: DC | PRN
  Administered 2011-07-26: 1 via TOPICAL
  Filled 2011-07-25: qty 56

## 2011-07-25 MED ORDER — PRENATAL MULTIVITAMIN CH
1.0000 | ORAL_TABLET | Freq: Every day | ORAL | Status: DC
  Administered 2011-07-26 – 2011-07-27 (×2): 1 via ORAL
  Filled 2011-07-25 (×2): qty 1

## 2011-07-25 MED ORDER — SENNOSIDES-DOCUSATE SODIUM 8.6-50 MG PO TABS
2.0000 | ORAL_TABLET | Freq: Every day | ORAL | Status: DC
  Administered 2011-07-25 – 2011-07-26 (×2): 2 via ORAL

## 2011-07-25 MED ORDER — DIBUCAINE 1 % RE OINT: 1.0000 "application " | TOPICAL_OINTMENT | RECTAL | Status: DC | PRN

## 2011-07-25 MED ORDER — METHYLERGONOVINE MALEATE 0.2 MG PO TABS: 0.2000 mg | ORAL_TABLET | ORAL | Status: DC | PRN

## 2011-07-25 MED ORDER — FENTANYL CITRATE 0.05 MG/ML IJ SOLN
50.0000 ug | INTRAMUSCULAR | Status: DC | PRN
  Administered 2011-07-25 (×2): 50 ug via INTRAVENOUS
  Filled 2011-07-25: qty 2

## 2011-07-25 MED ORDER — PHENYLEPHRINE 40 MCG/ML (10ML) SYRINGE FOR IV PUSH (FOR BLOOD PRESSURE SUPPORT): 80.0000 ug | PREFILLED_SYRINGE | INTRAVENOUS | Status: DC | PRN

## 2011-07-25 MED ORDER — EPHEDRINE 5 MG/ML INJ: 10.0000 mg | INTRAVENOUS | Status: DC | PRN

## 2011-07-25 MED ORDER — OXYCODONE-ACETAMINOPHEN 5-325 MG PO TABS
1.0000 | ORAL_TABLET | ORAL | Status: DC | PRN
  Administered 2011-07-25: 2 via ORAL
  Administered 2011-07-25: 1 via ORAL
  Administered 2011-07-26 (×4): 2 via ORAL
  Filled 2011-07-25: qty 1
  Filled 2011-07-25: qty 2
  Filled 2011-07-25: qty 1
  Filled 2011-07-25 (×2): qty 2
  Filled 2011-07-25: qty 1
  Filled 2011-07-25: qty 2

## 2011-07-25 MED ORDER — ZOLPIDEM TARTRATE 5 MG PO TABS: 5.0000 mg | ORAL_TABLET | Freq: Every evening | ORAL | Status: DC | PRN

## 2011-07-25 MED ORDER — FENTANYL CITRATE 0.05 MG/ML IJ SOLN
50.0000 ug | INTRAMUSCULAR | Status: DC | PRN
  Filled 2011-07-25: qty 1

## 2011-07-25 MED ORDER — PHENYLEPHRINE 40 MCG/ML (10ML) SYRINGE FOR IV PUSH (FOR BLOOD PRESSURE SUPPORT)
80.0000 ug | PREFILLED_SYRINGE | INTRAVENOUS | Status: DC | PRN
  Filled 2011-07-25: qty 5

## 2011-07-25 MED ORDER — FENTANYL 2.5 MCG/ML BUPIVACAINE 1/10 % EPIDURAL INFUSION (WH - ANES)
14.0000 mL/h | INTRAMUSCULAR | Status: DC
  Administered 2011-07-25: 14 mL/h via EPIDURAL
  Filled 2011-07-25 (×2): qty 60

## 2011-07-25 MED ORDER — FERROUS SULFATE 325 (65 FE) MG PO TABS
325.0000 mg | ORAL_TABLET | Freq: Two times a day (BID) | ORAL | Status: DC
  Administered 2011-07-25 – 2011-07-27 (×4): 325 mg via ORAL
  Filled 2011-07-25 (×4): qty 1

## 2011-07-25 MED ORDER — MISOPROSTOL 200 MCG PO TABS
ORAL_TABLET | ORAL | Status: AC
  Administered 2011-07-25: 1000 ug via RECTAL
  Filled 2011-07-25: qty 5

## 2011-07-25 MED ORDER — LACTATED RINGERS IV SOLN
500.0000 mL | Freq: Once | INTRAVENOUS | Status: AC
  Administered 2011-07-25: 500 mL via INTRAVENOUS

## 2011-07-25 MED ORDER — WITCH HAZEL-GLYCERIN EX PADS: 1.0000 "application " | MEDICATED_PAD | CUTANEOUS | Status: DC | PRN

## 2011-07-25 MED ORDER — EPHEDRINE 5 MG/ML INJ
10.0000 mg | INTRAVENOUS | Status: DC | PRN
  Filled 2011-07-25: qty 4

## 2011-07-25 MED ORDER — OXYTOCIN 20 UNITS IN LACTATED RINGERS INFUSION - SIMPLE: 125.0000 mL/h | INTRAVENOUS | Status: DC | PRN

## 2011-07-25 MED ORDER — MAGNESIUM HYDROXIDE 400 MG/5ML PO SUSP: 30.0000 mL | ORAL | Status: DC | PRN

## 2011-07-25 MED ORDER — DIPHENHYDRAMINE HCL 50 MG/ML IJ SOLN: 12.5000 mg | INTRAMUSCULAR | Status: DC | PRN

## 2011-07-25 MED ORDER — SIMETHICONE 80 MG PO CHEW: 80.0000 mg | CHEWABLE_TABLET | ORAL | Status: DC | PRN

## 2011-07-25 MED ORDER — FENTANYL 2.5 MCG/ML BUPIVACAINE 1/10 % EPIDURAL INFUSION (WH - ANES)
INTRAMUSCULAR | Status: DC | PRN
  Administered 2011-07-25: 13 mL/h via EPIDURAL

## 2011-07-25 MED ORDER — LANOLIN HYDROUS EX OINT: TOPICAL_OINTMENT | CUTANEOUS | Status: DC | PRN

## 2011-07-25 MED ORDER — MEASLES, MUMPS & RUBELLA VAC ~~LOC~~ INJ
0.5000 mL | INJECTION | Freq: Once | SUBCUTANEOUS | Status: DC
  Filled 2011-07-25: qty 0.5

## 2011-07-25 MED ORDER — LIDOCAINE HCL (PF) 1 % IJ SOLN
INTRAMUSCULAR | Status: DC | PRN
  Administered 2011-07-25 (×2): 8 mL

## 2011-07-25 MED ORDER — METHYLERGONOVINE MALEATE 0.2 MG/ML IJ SOLN: 0.2000 mg | INTRAMUSCULAR | Status: DC | PRN

## 2011-07-25 MED ORDER — TETANUS-DIPHTH-ACELL PERTUSSIS 5-2.5-18.5 LF-MCG/0.5 IM SUSP: 0.5000 mL | Freq: Once | INTRAMUSCULAR | Status: DC

## 2011-07-25 MED ORDER — DIPHENHYDRAMINE HCL 25 MG PO CAPS: 25.0000 mg | ORAL_CAPSULE | Freq: Four times a day (QID) | ORAL | Status: DC | PRN

## 2011-07-25 MED ORDER — IBUPROFEN 600 MG PO TABS
600.0000 mg | ORAL_TABLET | Freq: Four times a day (QID) | ORAL | Status: DC
  Administered 2011-07-25 – 2011-07-27 (×8): 600 mg via ORAL
  Filled 2011-07-25 (×8): qty 1

## 2011-07-25 MED ORDER — ONDANSETRON HCL 4 MG PO TABS: 4.0000 mg | ORAL_TABLET | ORAL | Status: DC | PRN

## 2011-07-25 NOTE — Anesthesia Procedure Notes (Signed)
Epidural Patient location during procedure: OB Start time: 07/25/2011 4:52 AM End time: 07/25/2011 4:56 AM Reason for block: procedure for pain  Staffing Anesthesiologist: Sandrea Hughs  Preanesthetic Checklist Completed: patient identified, site marked, surgical consent, pre-op evaluation, timeout performed, IV checked, risks and benefits discussed and monitors and equipment checked  Epidural Patient position: sitting Prep: site prepped and draped and DuraPrep Patient monitoring: continuous pulse ox and blood pressure Approach: midline Injection technique: LOR air  Needle:  Needle type: Tuohy  Needle gauge: 17 G Needle length: 9 cm Needle insertion depth: 4 cm Catheter type: closed end flexible Catheter size: 19 Gauge Catheter at skin depth: 9 cm Test dose: negative and Other  Assessment Sensory level: T8 Events: blood not aspirated, injection not painful, no injection resistance, negative IV test and no paresthesia

## 2011-07-25 NOTE — Anesthesia Preprocedure Evaluation (Signed)
Anesthesia Evaluation  Patient identified by MRN, date of birth, ID band Patient awake    Reviewed: Allergy & Precautions, H&P , NPO status , Patient's Chart, lab work & pertinent test results  Airway Mallampati: I TM Distance: >3 FB Neck ROM: full    Dental No notable dental hx.    Pulmonary neg pulmonary ROS,  breath sounds clear to auscultation  Pulmonary exam normal       Cardiovascular negative cardio ROS      Neuro/Psych    GI/Hepatic negative GI ROS, Neg liver ROS,   Endo/Other  negative endocrine ROS  Renal/GU negative Renal ROS  negative genitourinary   Musculoskeletal negative musculoskeletal ROS (+)   Abdominal Normal abdominal exam  (+)   Peds negative pediatric ROS (+)  Hematology negative hematology ROS (+)   Anesthesia Other Findings   Reproductive/Obstetrics (+) Pregnancy                           Anesthesia Physical Anesthesia Plan  ASA: II  Anesthesia Plan: Epidural   Post-op Pain Management:    Induction:   Airway Management Planned:   Additional Equipment:   Intra-op Plan:   Post-operative Plan:   Informed Consent: I have reviewed the patients History and Physical, chart, labs and discussed the procedure including the risks, benefits and alternatives for the proposed anesthesia with the patient or authorized representative who has indicated his/her understanding and acceptance.     Plan Discussed with:   Anesthesia Plan Comments:         Anesthesia Quick Evaluation

## 2011-07-25 NOTE — Progress Notes (Signed)
Stephanie [name] is a 31 y.o. G1P0 at [redacted]w[redacted]d  admitted for induction of labor due to Hypertension.  Subjective: No HA, vision changes, RUQ pain,  Feeling contractions  Objective: BP 108/59  Pulse 72  Temp(Src) 98.7 F (37.1 C) (Oral)  Resp 18  Ht 5\' 4"  (1.626 m)  Wt 74.844 kg (165 lb)  BMI 28.32 kg/m2  SpO2 99%  LMP 10/25/2010    FHT:  FHR: 130 bpm, variability: moderate,  accelerations:  Present,  decelerations:  Absent UC:   irregular, every 3-5 minutes SVE:   4.5/80/-2. Foley fell out  Labs: Lab Results  Component Value Date   WBC 12.3* 07/25/2011   HGB 12.5 07/25/2011   HCT 36.7 07/25/2011   MCV 90.8 07/25/2011   PLT 125* 07/25/2011   Results for orders placed during the hospital encounter of 07/24/11 (from the past 24 hour(s))  CBC     Status: Abnormal   Collection Time   07/24/11  7:41 PM      Component Value Range   WBC 8.7  4.0 - 10.5 (K/uL)   RBC 4.00  3.87 - 5.11 (MIL/uL)   Hemoglobin 12.3  12.0 - 15.0 (g/dL)   HCT 96.0 (*) 45.4 - 46.0 (%)   MCV 89.8  78.0 - 100.0 (fL)   MCH 30.8  26.0 - 34.0 (pg)   MCHC 34.3  30.0 - 36.0 (g/dL)   RDW 09.8  11.9 - 14.7 (%)   Platelets 123 (*) 150 - 400 (K/uL)  RPR     Status: Normal   Collection Time   07/24/11  7:41 PM      Component Value Range   RPR NON REACTIVE  NON REACTIVE   COMPREHENSIVE METABOLIC PANEL     Status: Abnormal   Collection Time   07/24/11  7:41 PM      Component Value Range   Sodium 132 (*) 135 - 145 (mEq/L)   Potassium 3.6  3.5 - 5.1 (mEq/L)   Chloride 98  96 - 112 (mEq/L)   CO2 24  19 - 32 (mEq/L)   Glucose, Bld 76  70 - 99 (mg/dL)   BUN 7  6 - 23 (mg/dL)   Creatinine, Ser 8.29  0.50 - 1.10 (mg/dL)   Calcium 8.7  8.4 - 56.2 (mg/dL)   Total Protein 6.4  6.0 - 8.3 (g/dL)   Albumin 2.7 (*) 3.5 - 5.2 (g/dL)   AST 19  0 - 37 (U/L)   ALT 12  0 - 35 (U/L)   Alkaline Phosphatase 137 (*) 39 - 117 (U/L)   Total Bilirubin 0.3  0.3 - 1.2 (mg/dL)   GFR calc non Af Amer >90  >90 (mL/min)   GFR calc Af Amer >90   >90 (mL/min)  PROTEIN / CREATININE RATIO, URINE     Status: Normal   Collection Time   07/24/11  7:56 PM      Component Value Range   Creatinine, Urine 47.91     Total Protein, Urine 3.9     PROTEIN CREATININE RATIO 0.08  0.00 - 0.15   CBC     Status: Abnormal   Collection Time   07/25/11  4:15 AM      Component Value Range   WBC 12.3 (*) 4.0 - 10.5 (K/uL)   RBC 4.04  3.87 - 5.11 (MIL/uL)   Hemoglobin 12.5  12.0 - 15.0 (g/dL)   HCT 13.0  86.5 - 78.4 (%)   MCV 90.8  78.0 -  100.0 (fL)   MCH 30.9  26.0 - 34.0 (pg)   MCHC 34.1  30.0 - 36.0 (g/dL)   RDW 40.9  81.1 - 91.4 (%)   Platelets 125 (*) 150 - 400 (K/uL)    Assessment / Plan: IOL for GHTN, no evidence of preeclampsia  Labor: ripeing phase complete Fetal Wellbeing:  Category I Pain Control:  Labor support without medications Anticipated MOD:  NSVD  CRESENZO-DISHMAN,Andrea Ferrer 07/25/2011, 6:40 AM

## 2011-07-25 NOTE — Progress Notes (Signed)
   Stephanie [name] is a 31 y.o. G1P0 at [redacted]w[redacted]d  admitted for induction of labor due to Hypertension.  Subjective: Comfortable with epidural  Objective: BP 113/77  Pulse 88  Temp(Src) 98.7 F (37.1 C) (Oral)  Resp 18  Ht 5\' 4"  (1.626 m)  Wt 74.844 kg (165 lb)  BMI 28.32 kg/m2  SpO2 99%  LMP 10/25/2010    FHT:  FHR: 130 bpm, variability: moderate,  accelerations:  Present,  decelerations:  Absent UC:   regular, every 3 minutes SVE:   Dilation: Lip/rim Effacement (%): 100 Station: 0 Exam by:: fran,cnm AROM clear fluid Labs: Lab Results  Component Value Date   WBC 12.3* 07/25/2011   HGB 12.5 07/25/2011   HCT 36.7 07/25/2011   MCV 90.8 07/25/2011   PLT 125* 07/25/2011    Assessment / Plan: Induction of labor due to gestational hypertension,  progressing well on pitocin  Labor: Progressing normally Fetal Wellbeing:  Category I Pain Control:  Epidural Anticipated MOD:  NSVD  CRESENZO-DISHMAN,Tanyla Stege 07/25/2011, 7:31 AM

## 2011-07-26 LAB — CBC
HCT: 27.2 % — ABNORMAL LOW (ref 36.0–46.0)
Hemoglobin: 9.3 g/dL — ABNORMAL LOW (ref 12.0–15.0)
MCH: 31.2 pg (ref 26.0–34.0)
MCHC: 34.2 g/dL (ref 30.0–36.0)
MCV: 91.3 fL (ref 78.0–100.0)
Platelets: 100 10*3/uL — ABNORMAL LOW (ref 150–400)
RBC: 2.98 MIL/uL — ABNORMAL LOW (ref 3.87–5.11)
RDW: 13.5 % (ref 11.5–15.5)
WBC: 8 10*3/uL (ref 4.0–10.5)

## 2011-07-26 NOTE — Progress Notes (Signed)
Post Partum Day #1  Subjective: no complaints, up ad lib, voiding, tolerating PO and + flatus + vaginal bleeding but patient states less than yesterday.  Breastfeeding.  Plans to use Loestrin for birth control.  No lightheadedness, no calf tenderness.   Objective: Blood pressure 108/71, pulse 75, temperature 98.2 F (36.8 C), temperature source Oral, resp. rate 18, height 5\' 4"  (1.626 m), weight 74.844 kg (165 lb), last menstrual period 10/25/2010, SpO2 99.00%, unknown if currently breastfeeding.  Physical Exam:  General: alert, cooperative and no distress Lochia: appropriate Abdomen: soft, nontender Uterine Fundus: firm Lower Extremities: + ankle edema, R>L.  DVT Evaluation: No evidence of DVT seen on physical exam. No calf tenderness with palpation.   Basename 07/26/11 0520 07/25/11 0415  HGB 9.3* 12.5  HCT 27.2* 36.7    Assessment/Plan: Plan for discharge tomorrow.   LOS: 2 days   Bedelia Person 07/26/2011, 7:56 AM

## 2011-07-26 NOTE — Anesthesia Postprocedure Evaluation (Signed)
Anesthesia Post Note  Patient: Stephanie [name] May  Procedure(s) Performed: * No procedures listed *  Anesthesia type: Epidural  Patient location: Mother/Baby  Post pain: Pain level controlled  Post assessment: Post-op Vital signs reviewed  Last Vitals:  Filed Vitals:   07/26/11 0526  BP: 108/71  Pulse: 75  Temp: 36.8 C  Resp: 18    Post vital signs: Reviewed  Level of consciousness: awake  Complications: No apparent anesthesia complications

## 2011-07-26 NOTE — Progress Notes (Signed)
I was present for the exam and agree with above.  Dorathy Kinsman 07/26/2011 8:41 AM

## 2011-07-26 NOTE — Progress Notes (Signed)

## 2011-07-27 MED ORDER — OXYCODONE-ACETAMINOPHEN 5-325 MG PO TABS: 1.0000 | ORAL_TABLET | ORAL | Status: AC | PRN

## 2011-07-27 MED ORDER — IBUPROFEN 600 MG PO TABS: 600.0000 mg | ORAL_TABLET | Freq: Four times a day (QID) | ORAL | Status: AC

## 2011-07-27 NOTE — Discharge Summary (Signed)
Obstetric Discharge Summary Reason for Admission: induction of labor for [facility] Prenatal Procedures: ultrasound Intrapartum Procedures: spontaneous vaginal delivery and postpartum hemorrhage Postpartum Procedures: none Complications-Operative and Postpartum: none Pt denies dizziness, weakness, n/v, or h/a.    Hemoglobin  Date Value Range Status  07/26/2011 9.3* 12.0-15.0 (g/dL) Final     DELTA CHECK NOTED     REPEATED TO VERIFY     HCT  Date Value Range Status  07/26/2011 27.2* 36.0-46.0 (%) Final    Physical Exam:  General: alert, cooperative and no distress Lochia: appropriate Uterine Fundus: firm, u-2 Incision: N/A DVT Evaluation: No evidence of DVT seen on physical exam. Negative Homan's sign. No cords or calf tenderness.  Discharge Diagnoses: Term Pregnancy-delivered  Discharge Information: Date: 07/27/2011 Activity: pelvic rest Diet: routine Medications: Ibuprofen and Percocet Condition: stable Instructions: refer to practice specific booklet Discharge to: home  Plans OCPs for contraception  Newborn Data: Live born female  Birth Weight: 8 lb 0.6 oz (3646 g) APGAR: 9, 10  Home with mother.  LEFTWICH-KIRBY, Zelda Reames 07/27/2011, 7:49 AM

## 2011-08-12 NOTE — Discharge Summary (Signed)
I have reviewed the documentation and agree. 

## 2011-08-14 NOTE — Telephone Encounter (Signed)
Error/tn 

## 2011-09-05 ENCOUNTER — Encounter: Payer: Self-pay | Admitting: Obstetrics & Gynecology

## 2011-09-05 ENCOUNTER — Ambulatory Visit (INDEPENDENT_AMBULATORY_CARE_PROVIDER_SITE_OTHER): Payer: BC Managed Care – PPO | Admitting: Obstetrics & Gynecology

## 2011-09-05 DIAGNOSIS — Z3009 Encounter for other general counseling and advice on contraception: Secondary | ICD-10-CM

## 2011-09-05 MED ORDER — NORETHIN-ETH ESTRAD-FE BIPHAS 1 MG-10 MCG / 10 MCG PO TABS: 1.0000 | ORAL_TABLET | Freq: Every day | ORAL | Status: DC

## 2011-09-05 NOTE — Progress Notes (Signed)
  Subjective:     Stephanie [name] is a 31 y.o. G1P1 female who presents for a postpartum visit. She is 6 weeks postpartum following a spontaneous vaginal delivery. I have fully reviewed the prenatal and intrapartum course. The delivery was at 39 gestational weeks. Outcome: spontaneous vaginal delivery. Anesthesia: epidural. Postpartum course has been uncomplicated. Baby's course has been uncomplicated. Baby is feeding by both breast and bottle. Bleeding: no bleeding. Bowel function is normal. Bladder function is normal. Patient is not sexually active. Contraception method is OCP (estrogen/progesterone). Postpartum depression screening: negative.  The following portions of the patient's history were reviewed and updated as appropriate: allergies, current medications, past family history, past medical history, past social history, past surgical history and problem list. Last pap smear was 12/2010.  Review of Systems A comprehensive review of systems was negative.   Objective:    BP 112/85  Pulse 81  Ht 5\' 4"  (1.626 m)  Wt 137 lb (62.143 kg)  BMI 23.52 kg/m2  Breastfeeding? Yes  General:  alert and no distress   Breasts:  inspection negative, no nipple discharge or bleeding, no masses or nodularity palpable  Lungs: clear to auscultation bilaterally  Heart:  regular rate and rhythm  Abdomen: soft, non-tender; bowel sounds normal; no masses,  no organomegaly   Vulva:  normal  Vagina: normal vagina  Cervix:  no cervical motion tenderness and no lesions  Corpus: normal size, contour, position, consistency, mobility, non-tender  Adnexa:  normal adnexa and no mass, fullness, tenderness  Rectal Exam: Not performed.        Assessment:    Normal postpartum exam. Pap smear not done at today's visit.   Plan:    1. Contraception: OCP (estrogen/progesterone). Lo Loestrin samples given to patient. 2. Pap smear due after 12/2011 3. Follow up as needed.

## 2012-01-13 ENCOUNTER — Ambulatory Visit: Payer: BC Managed Care – PPO | Admitting: Family Medicine

## 2012-02-11 ENCOUNTER — Ambulatory Visit: Payer: BC Managed Care – PPO | Admitting: Family Medicine

## 2012-02-21 ENCOUNTER — Ambulatory Visit: Payer: BC Managed Care – PPO | Admitting: Obstetrics and Gynecology

## 2012-02-27 ENCOUNTER — Encounter: Payer: Self-pay | Admitting: Family Medicine

## 2012-02-27 ENCOUNTER — Ambulatory Visit (INDEPENDENT_AMBULATORY_CARE_PROVIDER_SITE_OTHER): Payer: Managed Care, Other (non HMO) | Admitting: Family Medicine

## 2012-02-27 VITALS — BP 128/89 | HR 106 | Ht 63.0 in | Wt 127.0 lb

## 2012-02-27 DIAGNOSIS — Z1151 Encounter for screening for human papillomavirus (HPV): Secondary | ICD-10-CM

## 2012-02-27 DIAGNOSIS — Z124 Encounter for screening for malignant neoplasm of cervix: Secondary | ICD-10-CM

## 2012-02-27 DIAGNOSIS — Z01419 Encounter for gynecological examination (general) (routine) without abnormal findings: Secondary | ICD-10-CM

## 2012-02-27 DIAGNOSIS — Z3041 Encounter for surveillance of contraceptive pills: Secondary | ICD-10-CM

## 2012-02-27 MED ORDER — NORGESTIMATE-ETH ESTRADIOL 0.25-35 MG-MCG PO TABS: 1.0000 | ORAL_TABLET | Freq: Every day | ORAL | Status: DC

## 2012-02-27 NOTE — Patient Instructions (Signed)
Preventive Care for Adults, Female A healthy lifestyle and preventive care can promote health and wellness. Preventive health guidelines for women include the following key practices.  A routine yearly physical is a good way to check with your caregiver about your health and preventive screening. It is a chance to share any concerns and updates on your health, and to receive a thorough exam.  Visit your dentist for a routine exam and preventive care every 6 months. Brush your teeth twice a day and floss once a day. Good oral hygiene prevents tooth decay and gum disease.  The frequency of eye exams is based on your age, health, family medical history, use of contact lenses, and other factors. Follow your caregiver's recommendations for frequency of eye exams.  Eat a healthy diet. Foods like vegetables, fruits, whole grains, low-fat dairy products, and lean protein foods contain the nutrients you need without too many calories. Decrease your intake of foods high in solid fats, added sugars, and salt. Eat the right amount of calories for you.Get information about a proper diet from your caregiver, if necessary.  Regular physical exercise is one of the most important things you can do for your health. Most adults should get at least 150 minutes of moderate-intensity exercise (any activity that increases your heart rate and causes you to sweat) each week. In addition, most adults need muscle-strengthening exercises on 2 or more days a week.  Maintain a healthy weight. The body mass index (BMI) is a screening tool to identify possible weight problems. It provides an estimate of body fat based on height and weight. Your caregiver can help determine your BMI, and can help you achieve or maintain a healthy weight.For adults 20 years and older:  A BMI below 18.5 is considered underweight.  A BMI of 18.5 to 24.9 is normal.  A BMI of 25 to 29.9 is considered overweight.  A BMI of 30 and above is  considered obese.  Maintain normal blood lipids and cholesterol levels by exercising and minimizing your intake of saturated fat. Eat a balanced diet with plenty of fruit and vegetables. Blood tests for lipids and cholesterol should begin at age 20 and be repeated every 5 years. If your lipid or cholesterol levels are high, you are over 50, or you are at high risk for heart disease, you may need your cholesterol levels checked more frequently.Ongoing high lipid and cholesterol levels should be treated with medicines if diet and exercise are not effective.  If you smoke, find out from your caregiver how to quit. If you do not use tobacco, do not start.  If you are pregnant, do not drink alcohol. If you are breastfeeding, be very cautious about drinking alcohol. If you are not pregnant and choose to drink alcohol, do not exceed 1 drink per day. One drink is considered to be 12 ounces (355 mL) of beer, 5 ounces (148 mL) of wine, or 1.5 ounces (44 mL) of liquor.  Avoid use of street drugs. Do not share needles with anyone. Ask for help if you need support or instructions about stopping the use of drugs.  High blood pressure causes heart disease and increases the risk of stroke. Your blood pressure should be checked at least every 1 to 2 years. Ongoing high blood pressure should be treated with medicines if weight loss and exercise are not effective.  If you are 55 to 30 years old, ask your caregiver if you should take aspirin to prevent strokes.  Diabetes   screening involves taking a blood sample to check your fasting blood sugar level. This should be done once every 3 years, after age 45, if you are within normal weight and without risk factors for diabetes. Testing should be considered at a younger age or be carried out more frequently if you are overweight and have at least 1 risk factor for diabetes.  Breast cancer screening is essential preventive care for women. You should practice "breast  self-awareness." This means understanding the normal appearance and feel of your breasts and may include breast self-examination. Any changes detected, no matter how small, should be reported to a caregiver. Women in their 20s and 30s should have a clinical breast exam (CBE) by a caregiver as part of a regular health exam every 1 to 3 years. After age 40, women should have a CBE every year. Starting at age 40, women should consider having a mammography (breast X-ray test) every year. Women who have a family history of breast cancer should talk to their caregiver about genetic screening. Women at a high risk of breast cancer should talk to their caregivers about having magnetic resonance imaging (MRI) and a mammography every year.  The Pap test is a screening test for cervical cancer. A Pap test can show cell changes on the cervix that might become cervical cancer if left untreated. A Pap test is a procedure in which cells are obtained and examined from the lower end of the uterus (cervix).  Women should have a Pap test starting at age 21.  Between ages 21 and 29, Pap tests should be repeated every 2 years.  Beginning at age 30, you should have a Pap test every 3 years as long as the past 3 Pap tests have been normal.  Some women have medical problems that increase the chance of getting cervical cancer. Talk to your caregiver about these problems. It is especially important to talk to your caregiver if a new problem develops soon after your last Pap test. In these cases, your caregiver may recommend more frequent screening and Pap tests.  The above recommendations are the same for women who have or have not gotten the vaccine for human papillomavirus (HPV).  If you had a hysterectomy for a problem that was not cancer or a condition that could lead to cancer, then you no longer need Pap tests. Even if you no longer need a Pap test, a regular exam is a good idea to make sure no other problems are  starting.  If you are between ages 65 and 70, and you have had normal Pap tests going back 10 years, you no longer need Pap tests. Even if you no longer need a Pap test, a regular exam is a good idea to make sure no other problems are starting.  If you have had past treatment for cervical cancer or a condition that could lead to cancer, you need Pap tests and screening for cancer for at least 20 years after your treatment.  If Pap tests have been discontinued, risk factors (such as a new sexual partner) need to be reassessed to determine if screening should be resumed.  The HPV test is an additional test that may be used for cervical cancer screening. The HPV test looks for the virus that can cause the cell changes on the cervix. The cells collected during the Pap test can be tested for HPV. The HPV test could be used to screen women aged 30 years and older, and should   be used in women of any age who have unclear Pap test results. After the age of 30, women should have HPV testing at the same frequency as a Pap test.  Colorectal cancer can be detected and often prevented. Most routine colorectal cancer screening begins at the age of 50 and continues through age 75. However, your caregiver may recommend screening at an earlier age if you have risk factors for colon cancer. On a yearly basis, your caregiver may provide home test kits to check for hidden blood in the stool. Use of a small camera at the end of a tube, to directly examine the colon (sigmoidoscopy or colonoscopy), can detect the earliest forms of colorectal cancer. Talk to your caregiver about this at age 50, when routine screening begins. Direct examination of the colon should be repeated every 5 to 10 years through age 75, unless early forms of pre-cancerous polyps or small growths are found.  Hepatitis C blood testing is recommended for all people born from 1945 through 1965 and any individual with known risks for hepatitis C.  Practice  safe sex. Use condoms and avoid high-risk sexual practices to reduce the spread of sexually transmitted infections (STIs). STIs include gonorrhea, chlamydia, syphilis, trichomonas, herpes, HPV, and human immunodeficiency virus (HIV). Herpes, HIV, and HPV are viral illnesses that have no cure. They can result in disability, cancer, and death. Sexually active women aged 25 and younger should be checked for chlamydia. Older women with new or multiple partners should also be tested for chlamydia. Testing for other STIs is recommended if you are sexually active and at increased risk.  Osteoporosis is a disease in which the bones lose minerals and strength with aging. This can result in serious bone fractures. The risk of osteoporosis can be identified using a bone density scan. Women ages 65 and over and women at risk for fractures or osteoporosis should discuss screening with their caregivers. Ask your caregiver whether you should take a calcium supplement or vitamin D to reduce the rate of osteoporosis.  Menopause can be associated with physical symptoms and risks. Hormone replacement therapy is available to decrease symptoms and risks. You should talk to your caregiver about whether hormone replacement therapy is right for you.  Use sunscreen with sun protection factor (SPF) of 30 or more. Apply sunscreen liberally and repeatedly throughout the day. You should seek shade when your shadow is shorter than you. Protect yourself by wearing long sleeves, pants, a wide-brimmed hat, and sunglasses year round, whenever you are outdoors.  Once a month, do a whole body skin exam, using a mirror to look at the skin on your back. Notify your caregiver of new moles, moles that have irregular borders, moles that are larger than a pencil eraser, or moles that have changed in shape or color.  Stay current with required immunizations.  Influenza. You need a dose every fall (or winter). The composition of the flu vaccine  changes each year, so being vaccinated once is not enough.  Pneumococcal polysaccharide. You need 1 to 2 doses if you smoke cigarettes or if you have certain chronic medical conditions. You need 1 dose at age 65 (or older) if you have never been vaccinated.  Tetanus, diphtheria, pertussis (Tdap, Td). Get 1 dose of Tdap vaccine if you are younger than age 65, are over 65 and have contact with an infant, are a healthcare worker, are pregnant, or simply want to be protected from whooping cough. After that, you need a Td   booster dose every 10 years. Consult your caregiver if you have not had at least 3 tetanus and diphtheria-containing shots sometime in your life or have a deep or dirty wound.  HPV. You need this vaccine if you are a woman age 26 or younger. The vaccine is given in 3 doses over 6 months.  Measles, mumps, rubella (MMR). You need at least 1 dose of MMR if you were born in 1957 or later. You may also need a second dose.  Meningococcal. If you are age 19 to 21 and a first-year college student living in a residence hall, or have one of several medical conditions, you need to get vaccinated against meningococcal disease. You may also need additional booster doses.  Zoster (shingles). If you are age 60 or older, you should get this vaccine.  Varicella (chickenpox). If you have never had chickenpox or you were vaccinated but received only 1 dose, talk to your caregiver to find out if you need this vaccine.  Hepatitis A. You need this vaccine if you have a specific risk factor for hepatitis A virus infection or you simply wish to be protected from this disease. The vaccine is usually given as 2 doses, 6 to 18 months apart.  Hepatitis B. You need this vaccine if you have a specific risk factor for hepatitis B virus infection or you simply wish to be protected from this disease. The vaccine is given in 3 doses, usually over 6 months. Preventive Services / Frequency Ages 19 to 39  Blood  pressure check.** / Every 1 to 2 years.  Lipid and cholesterol check.** / Every 5 years beginning at age 20.  Clinical breast exam.** / Every 3 years for women in their 20s and 30s.  Pap test.** / Every 2 years from ages 21 through 29. Every 3 years starting at age 30 through age 65 or 70 with a history of 3 consecutive normal Pap tests.  HPV screening.** / Every 3 years from ages 30 through ages 65 to 70 with a history of 3 consecutive normal Pap tests.  Hepatitis C blood test.** / For any individual with known risks for hepatitis C.  Skin self-exam. / Monthly.  Influenza immunization.** / Every year.  Pneumococcal polysaccharide immunization.** / 1 to 2 doses if you smoke cigarettes or if you have certain chronic medical conditions.  Tetanus, diphtheria, pertussis (Tdap, Td) immunization. / A one-time dose of Tdap vaccine. After that, you need a Td booster dose every 10 years.  HPV immunization. / 3 doses over 6 months, if you are 26 and younger.  Measles, mumps, rubella (MMR) immunization. / You need at least 1 dose of MMR if you were born in 1957 or later. You may also need a second dose.  Meningococcal immunization. / 1 dose if you are age 19 to 21 and a first-year college student living in a residence hall, or have one of several medical conditions, you need to get vaccinated against meningococcal disease. You may also need additional booster doses.  Varicella immunization.** / Consult your caregiver.  Hepatitis A immunization.** / Consult your caregiver. 2 doses, 6 to 18 months apart.  Hepatitis B immunization.** / Consult your caregiver. 3 doses usually over 6 months. Ages 40 to 64  Blood pressure check.** / Every 1 to 2 years.  Lipid and cholesterol check.** / Every 5 years beginning at age 20.  Clinical breast exam.** / Every year after age 40.  Mammogram.** / Every year beginning at age 40   and continuing for as long as you are in good health. Consult with your  caregiver.  Pap test.** / Every 3 years starting at age 30 through age 65 or 70 with a history of 3 consecutive normal Pap tests.  HPV screening.** / Every 3 years from ages 30 through ages 65 to 70 with a history of 3 consecutive normal Pap tests.  Fecal occult blood test (FOBT) of stool. / Every year beginning at age 50 and continuing until age 75. You may not need to do this test if you get a colonoscopy every 10 years.  Flexible sigmoidoscopy or colonoscopy.** / Every 5 years for a flexible sigmoidoscopy or every 10 years for a colonoscopy beginning at age 50 and continuing until age 75.  Hepatitis C blood test.** / For all people born from 1945 through 1965 and any individual with known risks for hepatitis C.  Skin self-exam. / Monthly.  Influenza immunization.** / Every year.  Pneumococcal polysaccharide immunization.** / 1 to 2 doses if you smoke cigarettes or if you have certain chronic medical conditions.  Tetanus, diphtheria, pertussis (Tdap, Td) immunization.** / A one-time dose of Tdap vaccine. After that, you need a Td booster dose every 10 years.  Measles, mumps, rubella (MMR) immunization. / You need at least 1 dose of MMR if you were born in 1957 or later. You may also need a second dose.  Varicella immunization.** / Consult your caregiver.  Meningococcal immunization.** / Consult your caregiver.  Hepatitis A immunization.** / Consult your caregiver. 2 doses, 6 to 18 months apart.  Hepatitis B immunization.** / Consult your caregiver. 3 doses, usually over 6 months. Ages 65 and over  Blood pressure check.** / Every 1 to 2 years.  Lipid and cholesterol check.** / Every 5 years beginning at age 20.  Clinical breast exam.** / Every year after age 40.  Mammogram.** / Every year beginning at age 40 and continuing for as long as you are in good health. Consult with your caregiver.  Pap test.** / Every 3 years starting at age 30 through age 65 or 70 with a 3  consecutive normal Pap tests. Testing can be stopped between 65 and 70 with 3 consecutive normal Pap tests and no abnormal Pap or HPV tests in the past 10 years.  HPV screening.** / Every 3 years from ages 30 through ages 65 or 70 with a history of 3 consecutive normal Pap tests. Testing can be stopped between 65 and 70 with 3 consecutive normal Pap tests and no abnormal Pap or HPV tests in the past 10 years.  Fecal occult blood test (FOBT) of stool. / Every year beginning at age 50 and continuing until age 75. You may not need to do this test if you get a colonoscopy every 10 years.  Flexible sigmoidoscopy or colonoscopy.** / Every 5 years for a flexible sigmoidoscopy or every 10 years for a colonoscopy beginning at age 50 and continuing until age 75.  Hepatitis C blood test.** / For all people born from 1945 through 1965 and any individual with known risks for hepatitis C.  Osteoporosis screening.** / A one-time screening for women ages 65 and over and women at risk for fractures or osteoporosis.  Skin self-exam. / Monthly.  Influenza immunization.** / Every year.  Pneumococcal polysaccharide immunization.** / 1 dose at age 65 (or older) if you have never been vaccinated.  Tetanus, diphtheria, pertussis (Tdap, Td) immunization. / A one-time dose of Tdap vaccine if you are over   65 and have contact with an infant, are a healthcare worker, or simply want to be protected from whooping cough. After that, you need a Td booster dose every 10 years.  Varicella immunization.** / Consult your caregiver.  Meningococcal immunization.** / Consult your caregiver.  Hepatitis A immunization.** / Consult your caregiver. 2 doses, 6 to 18 months apart.  Hepatitis B immunization.** / Check with your caregiver. 3 doses, usually over 6 months. ** Family history and personal history of risk and conditions may change your caregiver's recommendations. Document Released: 04/30/2001 Document Revised: 05/27/2011  Document Reviewed: 07/30/2010 [employer] Patient Information 2013 [employer].  

## 2012-02-27 NOTE — Progress Notes (Signed)
  Subjective:     Stephanie [name] is a 31 y.o. female and is here for a comprehensive physical exam. The patient reports no problems.  Her daughter is 13 months old and doing well.  She is on Lo Loestrin which is costing $50/month.  She would like a generic equivalent.  History   Social History  . Marital Status: Married    Spouse Name: N/A    Number of Children: N/A  . Years of Education: N/A   Occupational History  . Not on file.   Social History Main Topics  . Smoking status: Never Smoker   . Smokeless tobacco: Never Used  . Alcohol Use: No  . Drug Use: No  . Sexually Active: Yes -- Female partner(s)    Birth Control/ Protection: Condom, Pill   Other Topics Concern  . Not on file   Social History Narrative  . No narrative on file   Health Maintenance  Topic Date Due  . Tetanus/tdap  05/27/2000  . Influenza Vaccine  11/17/2011  . Pap Smear  01/02/2014    The following portions of the patient's history were reviewed and updated as appropriate: allergies, current medications, past family history, past medical history, past social history, past surgical history and problem list.  Review of Systems A comprehensive review of systems was negative.   Objective:    BP 128/89  Pulse 106  Ht 5\' 3"  (1.6 m)  Wt 127 lb (57.607 kg)  BMI 22.50 kg/m2  LMP 01/30/2012  Breastfeeding? No General appearance: alert, cooperative and appears stated age Head: Normocephalic, without obvious abnormality, atraumatic Neck: no adenopathy, supple, symmetrical, trachea midline and thyroid not enlarged, symmetric, no tenderness/mass/nodules Lungs: clear to auscultation bilaterally Breasts: normal appearance, no masses or tenderness Heart: regular rate and rhythm, S1, S2 normal, no murmur, click, rub or gallop Abdomen: soft, non-tender; bowel sounds normal; no masses,  no organomegaly Pelvic: cervix normal in appearance, external genitalia normal, no adnexal masses or tenderness, no cervical motion  tenderness, uterus normal size, shape, and consistency and vagina normal without discharge Extremities: extremities normal, atraumatic, no cyanosis or edema Pulses: 2+ and symmetric Skin: Skin color, texture, turgor normal. No rashes or lesions Lymph nodes: Cervical, supraclavicular, and axillary nodes normal. Neurologic: Grossly normal    Assessment:    Healthy female exam. Doing well.     Plan:    Labs at work Pap smear today Change to ortho-cyclen. See After Visit Summary for Counseling Recommendations

## 2012-07-06 ENCOUNTER — Telehealth: Payer: Self-pay | Admitting: *Deleted

## 2012-07-06 DIAGNOSIS — N390 Urinary tract infection, site not specified: Secondary | ICD-10-CM

## 2012-07-06 DIAGNOSIS — B379 Candidiasis, unspecified: Secondary | ICD-10-CM

## 2012-07-06 MED ORDER — CIPROFLOXACIN HCL 500 MG PO TABS: 500.0000 mg | ORAL_TABLET | Freq: Two times a day (BID) | ORAL | Status: DC

## 2012-07-06 MED ORDER — FLUCONAZOLE 150 MG PO TABS: 150.0000 mg | ORAL_TABLET | Freq: Once | ORAL | Status: DC

## 2012-07-06 NOTE — Telephone Encounter (Signed)
Patient is having increased pain with urination and burning.  She would like to have something called in as she is not able to come into the office due to her work schedule.  She also requests Diflucan and she gets a yeast infection whenever she take antibiotics.

## 2013-01-05 ENCOUNTER — Ambulatory Visit: Payer: Managed Care, Other (non HMO) | Admitting: Family Medicine

## 2013-01-26 ENCOUNTER — Encounter: Payer: Self-pay | Admitting: Family Medicine

## 2013-01-26 ENCOUNTER — Ambulatory Visit (INDEPENDENT_AMBULATORY_CARE_PROVIDER_SITE_OTHER): Payer: Managed Care, Other (non HMO) | Admitting: Family Medicine

## 2013-01-26 VITALS — BP 128/92 | HR 72 | Ht 63.0 in | Wt 130.0 lb

## 2013-01-26 DIAGNOSIS — Z3041 Encounter for surveillance of contraceptive pills: Secondary | ICD-10-CM

## 2013-01-26 DIAGNOSIS — N946 Dysmenorrhea, unspecified: Secondary | ICD-10-CM

## 2013-01-26 DIAGNOSIS — Z23 Encounter for immunization: Secondary | ICD-10-CM

## 2013-01-26 DIAGNOSIS — Z01419 Encounter for gynecological examination (general) (routine) without abnormal findings: Secondary | ICD-10-CM

## 2013-01-26 DIAGNOSIS — Z124 Encounter for screening for malignant neoplasm of cervix: Secondary | ICD-10-CM

## 2013-01-26 MED ORDER — NORGESTIMATE-ETH ESTRADIOL 0.25-35 MG-MCG PO TABS: 1.0000 | ORAL_TABLET | Freq: Every day | ORAL | Status: DC

## 2013-01-26 NOTE — Patient Instructions (Signed)
Preventive Care for Adults, Female A healthy lifestyle and preventive care can promote health and wellness. Preventive health guidelines for women include the following key practices.  A routine yearly physical is a good way to check with your caregiver about your health and preventive screening. It is a chance to share any concerns and updates on your health, and to receive a thorough exam.  Visit your dentist for a routine exam and preventive care every 6 months. Brush your teeth twice a day and floss once a day. Good oral hygiene prevents tooth decay and gum disease.  The frequency of eye exams is based on your age, health, family medical history, use of contact lenses, and other factors. Follow your caregiver's recommendations for frequency of eye exams.  Eat a healthy diet. Foods like vegetables, fruits, whole grains, low-fat dairy products, and lean protein foods contain the nutrients you need without too many calories. Decrease your intake of foods high in solid fats, added sugars, and salt. Eat the right amount of calories for you.Get information about a proper diet from your caregiver, if necessary.  Regular physical exercise is one of the most important things you can do for your health. Most adults should get at least 150 minutes of moderate-intensity exercise (any activity that increases your heart rate and causes you to sweat) each week. In addition, most adults need muscle-strengthening exercises on 2 or more days a week.  Maintain a healthy weight. The body mass index (BMI) is a screening tool to identify possible weight problems. It provides an estimate of body fat based on height and weight. Your caregiver can help determine your BMI, and can help you achieve or maintain a healthy weight.For adults 20 years and older:  A BMI below 18.5 is considered underweight.  A BMI of 18.5 to 24.9 is normal.  A BMI of 25 to 29.9 is considered overweight.  A BMI of 30 and above is  considered obese.  Maintain normal blood lipids and cholesterol levels by exercising and minimizing your intake of saturated fat. Eat a balanced diet with plenty of fruit and vegetables. Blood tests for lipids and cholesterol should begin at age 20 and be repeated every 5 years. If your lipid or cholesterol levels are high, you are over 50, or you are at high risk for heart disease, you may need your cholesterol levels checked more frequently.Ongoing high lipid and cholesterol levels should be treated with medicines if diet and exercise are not effective.  If you smoke, find out from your caregiver how to quit. If you do not use tobacco, do not start.  Lung cancer screening is recommended for adults aged 55 80 years who are at high risk for developing lung cancer because of a history of smoking. Yearly low-dose computed tomography (CT) is recommended for people who have at least a 30-pack-year history of smoking and are a current smoker or have quit within the past 15 years. A pack year of smoking is smoking an average of 1 pack of cigarettes a day for 1 year (for example: 1 pack a day for 30 years or 2 packs a day for 15 years). Yearly screening should continue until the smoker has stopped smoking for at least 15 years. Yearly screening should also be stopped for people who develop a health problem that would prevent them from having lung cancer treatment.  If you are pregnant, do not drink alcohol. If you are breastfeeding, be very cautious about drinking alcohol. If you are   not pregnant and choose to drink alcohol, do not exceed 1 drink per day. One drink is considered to be 12 ounces (355 mL) of beer, 5 ounces (148 mL) of wine, or 1.5 ounces (44 mL) of liquor.  Avoid use of street drugs. Do not share needles with anyone. Ask for help if you need support or instructions about stopping the use of drugs.  High blood pressure causes heart disease and increases the risk of stroke. Your blood pressure  should be checked at least every 1 to 2 years. Ongoing high blood pressure should be treated with medicines if weight loss and exercise are not effective.  If you are 55 to 31 years old, ask your caregiver if you should take aspirin to prevent strokes.  Diabetes screening involves taking a blood sample to check your fasting blood sugar level. This should be done once every 3 years, after age 45, if you are within normal weight and without risk factors for diabetes. Testing should be considered at a younger age or be carried out more frequently if you are overweight and have at least 1 risk factor for diabetes.  Breast cancer screening is essential preventive care for women. You should practice "breast self-awareness." This means understanding the normal appearance and feel of your breasts and may include breast self-examination. Any changes detected, no matter how small, should be reported to a caregiver. Women in their 20s and 30s should have a clinical breast exam (CBE) by a caregiver as part of a regular health exam every 1 to 3 years. After age 40, women should have a CBE every year. Starting at age 40, women should consider having a mammography (breast X-ray test) every year. Women who have a family history of breast cancer should talk to their caregiver about genetic screening. Women at a high risk of breast cancer should talk to their caregivers about having magnetic resonance imaging (MRI) and a mammography every year.  Breast cancer gene (BRCA)-related cancer risk assessment is recommended for women who have family members with BRCA-related cancers. BRCA-related cancers include breast, ovarian, tubal, and peritoneal cancers. Having family members with these cancers may be associated with an increased risk for harmful changes (mutations) in the breast cancer genes BRCA1 and BRCA2. Results of the assessment will determine the need for genetic counseling and BRCA1 and BRCA2 testing.  The Pap test is  a screening test for cervical cancer. A Pap test can show cell changes on the cervix that might become cervical cancer if left untreated. A Pap test is a procedure in which cells are obtained and examined from the lower end of the uterus (cervix).  Women should have a Pap test starting at age 21.  Between ages 21 and 29, Pap tests should be repeated every 2 years.  Beginning at age 30, you should have a Pap test every 3 years as long as the past 3 Pap tests have been normal.  Some women have medical problems that increase the chance of getting cervical cancer. Talk to your caregiver about these problems. It is especially important to talk to your caregiver if a new problem develops soon after your last Pap test. In these cases, your caregiver may recommend more frequent screening and Pap tests.  The above recommendations are the same for women who have or have not gotten the vaccine for human papillomavirus (HPV).  If you had a hysterectomy for a problem that was not cancer or a condition that could lead to cancer, then   you no longer need Pap tests. Even if you no longer need a Pap test, a regular exam is a good idea to make sure no other problems are starting.  If you are between ages 65 and 70, and you have had normal Pap tests going back 10 years, you no longer need Pap tests. Even if you no longer need a Pap test, a regular exam is a good idea to make sure no other problems are starting.  If you have had past treatment for cervical cancer or a condition that could lead to cancer, you need Pap tests and screening for cancer for at least 20 years after your treatment.  If Pap tests have been discontinued, risk factors (such as a new sexual partner) need to be reassessed to determine if screening should be resumed.  The HPV test is an additional test that may be used for cervical cancer screening. The HPV test looks for the virus that can cause the cell changes on the cervix. The cells collected  during the Pap test can be tested for HPV. The HPV test could be used to screen women aged 30 years and older, and should be used in women of any age who have unclear Pap test results. After the age of 30, women should have HPV testing at the same frequency as a Pap test.  Colorectal cancer can be detected and often prevented. Most routine colorectal cancer screening begins at the age of 50 and continues through age 75. However, your caregiver may recommend screening at an earlier age if you have risk factors for colon cancer. On a yearly basis, your caregiver may provide home test kits to check for hidden blood in the stool. Use of a small camera at the end of a tube, to directly examine the colon (sigmoidoscopy or colonoscopy), can detect the earliest forms of colorectal cancer. Talk to your caregiver about this at age 50, when routine screening begins. Direct examination of the colon should be repeated every 5 to 10 years through age 75, unless early forms of pre-cancerous polyps or small growths are found.  Hepatitis C blood testing is recommended for all people born from 1945 through 1965 and any individual with known risks for hepatitis C.  Practice safe sex. Use condoms and avoid high-risk sexual practices to reduce the spread of sexually transmitted infections (STIs). STIs include gonorrhea, chlamydia, syphilis, trichomonas, herpes, HPV, and human immunodeficiency virus (HIV). Herpes, HIV, and HPV are viral illnesses that have no cure. They can result in disability, cancer, and death. Sexually active women aged 25 and younger should be checked for chlamydia. Older women with new or multiple partners should also be tested for chlamydia. Testing for other STIs is recommended if you are sexually active and at increased risk.  Osteoporosis is a disease in which the bones lose minerals and strength with aging. This can result in serious bone fractures. The risk of osteoporosis can be identified using a  bone density scan. Women ages 65 and over and women at risk for fractures or osteoporosis should discuss screening with their caregivers. Ask your caregiver whether you should take a calcium supplement or vitamin D to reduce the rate of osteoporosis.  Menopause can be associated with physical symptoms and risks. Hormone replacement therapy is available to decrease symptoms and risks. You should talk to your caregiver about whether hormone replacement therapy is right for you.  Use sunscreen. Apply sunscreen liberally and repeatedly throughout the day. You should seek shade   when your shadow is shorter than you. Protect yourself by wearing long sleeves, pants, a wide-brimmed hat, and sunglasses year round, whenever you are outdoors.  Once a month, do a whole body skin exam, using a mirror to look at the skin on your back. Notify your caregiver of new moles, moles that have irregular borders, moles that are larger than a pencil eraser, or moles that have changed in shape or color.  Stay current with required immunizations.  Influenza vaccine. All adults should be immunized every year.  Tetanus, diphtheria, and acellular pertussis (Td, Tdap) vaccine. Pregnant women should receive 1 dose of Tdap vaccine during each pregnancy. The dose should be obtained regardless of the length of time since the last dose. Immunization is preferred during the 27th to 36th week of gestation. An adult who has not previously received Tdap or who does not know her vaccine status should receive 1 dose of Tdap. This initial dose should be followed by tetanus and diphtheria toxoids (Td) booster doses every 10 years. Adults with an unknown or incomplete history of completing a 3-dose immunization series with Td-containing vaccines should begin or complete a primary immunization series including a Tdap dose. Adults should receive a Td booster every 10 years.  Varicella vaccine. An adult without evidence of immunity to varicella  should receive 2 doses or a second dose if she has previously received 1 dose. Pregnant females who do not have evidence of immunity should receive the first dose after pregnancy. This first dose should be obtained before leaving the health care facility. The second dose should be obtained 4 8 weeks after the first dose.  Human papillomavirus (HPV) vaccine. Females aged 13 26 years who have not received the vaccine previously should obtain the 3-dose series. The vaccine is not recommended for use in pregnant females. However, pregnancy testing is not needed before receiving a dose. If a female is found to be pregnant after receiving a dose, no treatment is needed. In that case, the remaining doses should be delayed until after the pregnancy. Immunization is recommended for any person with an immunocompromised condition through the age of 26 years if she did not get any or all doses earlier. During the 3-dose series, the second dose should be obtained 4 8 weeks after the first dose. The third dose should be obtained 24 weeks after the first dose and 16 weeks after the second dose.  Zoster vaccine. One dose is recommended for adults aged 60 years or older unless certain conditions are present.  Measles, mumps, and rubella (MMR) vaccine. Adults born before 1957 generally are considered immune to measles and mumps. Adults born in 1957 or later should have 1 or more doses of MMR vaccine unless there is a contraindication to the vaccine or there is laboratory evidence of immunity to each of the three diseases. A routine second dose of MMR vaccine should be obtained at least 28 days after the first dose for students attending postsecondary schools, health care workers, or international travelers. People who received inactivated measles vaccine or an unknown type of measles vaccine during 1963 1967 should receive 2 doses of MMR vaccine. People who received inactivated mumps vaccine or an unknown type of mumps vaccine  before 1979 and are at high risk for mumps infection should consider immunization with 2 doses of MMR vaccine. For females of childbearing age, rubella immunity should be determined. If there is no evidence of immunity, females who are not pregnant should be vaccinated. If there   is no evidence of immunity, females who are pregnant should delay immunization until after pregnancy. Unvaccinated health care workers born before 1957 who lack laboratory evidence of measles, mumps, or rubella immunity or laboratory confirmation of disease should consider measles and mumps immunization with 2 doses of MMR vaccine or rubella immunization with 1 dose of MMR vaccine.  Pneumococcal 13-valent conjugate (PCV13) vaccine. When indicated, a person who is uncertain of her immunization history and has no record of immunization should receive the PCV13 vaccine. An adult aged 19 years or older who has certain medical conditions and has not been previously immunized should receive 1 dose of PCV13 vaccine. This PCV13 should be followed with a dose of pneumococcal polysaccharide (PPSV23) vaccine. The PPSV23 vaccine dose should be obtained at least 8 weeks after the dose of PCV13 vaccine. An adult aged 19 years or older who has certain medical conditions and previously received 1 or more doses of PPSV23 vaccine should receive 1 dose of PCV13. The PCV13 vaccine dose should be obtained 1 or more years after the last PPSV23 vaccine dose.  Pneumococcal polysaccharide (PPSV23) vaccine. When PCV13 is also indicated, PCV13 should be obtained first. All adults aged 65 years and older should be immunized. An adult younger than age 65 years who has certain medical conditions should be immunized. Any person who resides in a nursing home or long-term care facility should be immunized. An adult smoker should be immunized. People with an immunocompromised condition and certain other conditions should receive both PCV13 and PPSV23 vaccines. People  with human immunodeficiency virus (HIV) infection should be immunized as soon as possible after diagnosis. Immunization during chemotherapy or radiation therapy should be avoided. Routine use of PPSV23 vaccine is not recommended for American Indians, [place] Natives, or people younger than 65 years unless there are medical conditions that require PPSV23 vaccine. When indicated, people who have unknown immunization and have no record of immunization should receive PPSV23 vaccine. One-time revaccination 5 years after the first dose of PPSV23 is recommended for people aged 19 64 years who have chronic kidney failure, nephrotic syndrome, asplenia, or immunocompromised conditions. People who received 1 2 doses of PPSV23 before age 65 years should receive another dose of PPSV23 vaccine at age 65 years or later if at least 5 years have passed since the previous dose. Doses of PPSV23 are not needed for people immunized with PPSV23 at or after age 65 years.  Meningococcal vaccine. Adults with asplenia or persistent complement component deficiencies should receive 2 doses of quadrivalent meningococcal conjugate (MenACWY-D) vaccine. The doses should be obtained at least 2 months apart. Microbiologists working with certain meningococcal bacteria, military recruits, people at risk during an outbreak, and people who travel to or live in countries with a high rate of meningitis should be immunized. A first-year college student up through age 21 years who is living in a residence hall should receive a dose if she did not receive a dose on or after her 16th birthday. Adults who have certain high-risk conditions should receive one or more doses of vaccine.  Hepatitis A vaccine. Adults who wish to be protected from this disease, have certain high-risk conditions, work with hepatitis A-infected animals, work in hepatitis A research labs, or travel to or work in countries with a high rate of hepatitis A should be immunized. Adults  who were previously unvaccinated and who anticipate close contact with an international adoptee during the first 60 days after arrival in the [place] from a country   with a high rate of hepatitis A should be immunized.  Hepatitis B vaccine. Adults who wish to be protected from this disease, have certain high-risk conditions, may be exposed to blood or other infectious body fluids, are household contacts or sex partners of hepatitis B positive people, are clients or workers in certain care facilities, or travel to or work in countries with a high rate of hepatitis B should be immunized.  Haemophilus influenzae type b (Hib) vaccine. A previously unvaccinated person with asplenia or sickle cell disease or having a scheduled splenectomy should receive 1 dose of Hib vaccine. Regardless of previous immunization, a recipient of a hematopoietic stem cell transplant should receive a 3-dose series 6 12 months after her successful transplant. Hib vaccine is not recommended for adults with HIV infection. Preventive Services / Frequency Ages 19 to 39  Blood pressure check.** / Every 1 to 2 years.  Lipid and cholesterol check.** / Every 5 years beginning at age 20.  Clinical breast exam.** / Every 3 years for women in their 20s and 30s.  BRCA-related cancer risk assessment.** / For women who have family members with a BRCA-related cancer (breast, ovarian, tubal, or peritoneal cancers).  Pap test.** / Every 2 years from ages 21 through 29. Every 3 years starting at age 30 through age 65 or 70 with a history of 3 consecutive normal Pap tests.  HPV screening.** / Every 3 years from ages 30 through ages 65 to 70 with a history of 3 consecutive normal Pap tests.  Hepatitis C blood test.** / For any individual with known risks for hepatitis C.  Skin self-exam. / Monthly.  Influenza vaccine. / Every year.  Tetanus, diphtheria, and acellular pertussis (Tdap, Td) vaccine.** / Consult your caregiver. Pregnant  women should receive 1 dose of Tdap vaccine during each pregnancy. 1 dose of Td every 10 years.  Varicella vaccine.** / Consult your caregiver. Pregnant females who do not have evidence of immunity should receive the first dose after pregnancy.  HPV vaccine. / 3 doses over 6 months, if 26 and younger. The vaccine is not recommended for use in pregnant females. However, pregnancy testing is not needed before receiving a dose.  Measles, mumps, rubella (MMR) vaccine.** / You need at least 1 dose of MMR if you were born in 1957 or later. You may also need a 2nd dose. For females of childbearing age, rubella immunity should be determined. If there is no evidence of immunity, females who are not pregnant should be vaccinated. If there is no evidence of immunity, females who are pregnant should delay immunization until after pregnancy.  Pneumococcal 13-valent conjugate (PCV13) vaccine.** / Consult your caregiver.  Pneumococcal polysaccharide (PPSV23) vaccine.** / 1 to 2 doses if you smoke cigarettes or if you have certain conditions.  Meningococcal vaccine.** / 1 dose if you are age 19 to 21 years and a first-year college student living in a residence hall, or have one of several medical conditions, you need to get vaccinated against meningococcal disease. You may also need additional booster doses.  Hepatitis A vaccine.** / Consult your caregiver.  Hepatitis B vaccine.** / Consult your caregiver.  Haemophilus influenzae type b (Hib) vaccine.** / Consult your caregiver. Ages 40 to 64  Blood pressure check.** / Every 1 to 2 years.  Lipid and cholesterol check.** / Every 5 years beginning at age 20.  Lung cancer screening. / Every year if you are aged 55 80 years and have a 30-pack-year history of smoking and   currently smoke or have quit within the past 15 years. Yearly screening is stopped once you have quit smoking for at least 15 years or develop a health problem that would prevent you from having  lung cancer treatment.  Clinical breast exam.** / Every year after age 40.  BRCA-related cancer risk assessment.** / For women who have family members with a BRCA-related cancer (breast, ovarian, tubal, or peritoneal cancers).  Mammogram.** / Every year beginning at age 40 and continuing for as long as you are in good health. Consult with your caregiver.  Pap test.** / Every 3 years starting at age 30 through age 65 or 70 with a history of 3 consecutive normal Pap tests.  HPV screening.** / Every 3 years from ages 30 through ages 65 to 70 with a history of 3 consecutive normal Pap tests.  Fecal occult blood test (FOBT) of stool. / Every year beginning at age 50 and continuing until age 75. You may not need to do this test if you get a colonoscopy every 10 years.  Flexible sigmoidoscopy or colonoscopy.** / Every 5 years for a flexible sigmoidoscopy or every 10 years for a colonoscopy beginning at age 50 and continuing until age 75.  Hepatitis C blood test.** / For all people born from 1945 through 1965 and any individual with known risks for hepatitis C.  Skin self-exam. / Monthly.  Influenza vaccine. / Every year.  Tetanus, diphtheria, and acellular pertussis (Tdap/Td) vaccine.** / Consult your caregiver. Pregnant women should receive 1 dose of Tdap vaccine during each pregnancy. 1 dose of Td every 10 years.  Varicella vaccine.** / Consult your caregiver. Pregnant females who do not have evidence of immunity should receive the first dose after pregnancy.  Zoster vaccine.** / 1 dose for adults aged 60 years or older.  Measles, mumps, rubella (MMR) vaccine.** / You need at least 1 dose of MMR if you were born in 1957 or later. You may also need a 2nd dose. For females of childbearing age, rubella immunity should be determined. If there is no evidence of immunity, females who are not pregnant should be vaccinated. If there is no evidence of immunity, females who are pregnant should delay  immunization until after pregnancy.  Pneumococcal 13-valent conjugate (PCV13) vaccine.** / Consult your caregiver.  Pneumococcal polysaccharide (PPSV23) vaccine.** / 1 to 2 doses if you smoke cigarettes or if you have certain conditions.  Meningococcal vaccine.** / Consult your caregiver.  Hepatitis A vaccine.** / Consult your caregiver.  Hepatitis B vaccine.** / Consult your caregiver.  Haemophilus influenzae type b (Hib) vaccine.** / Consult your caregiver. Ages 65 and over  Blood pressure check.** / Every 1 to 2 years.  Lipid and cholesterol check.** / Every 5 years beginning at age 20.  Lung cancer screening. / Every year if you are aged 55 80 years and have a 30-pack-year history of smoking and currently smoke or have quit within the past 15 years. Yearly screening is stopped once you have quit smoking for at least 15 years or develop a health problem that would prevent you from having lung cancer treatment.  Clinical breast exam.** / Every year after age 40.  BRCA-related cancer risk assessment.** / For women who have family members with a BRCA-related cancer (breast, ovarian, tubal, or peritoneal cancers).  Mammogram.** / Every year beginning at age 40 and continuing for as long as you are in good health. Consult with your caregiver.  Pap test.** / Every 3 years starting at age   30 through age 65 or 70 with a 3 consecutive normal Pap tests. Testing can be stopped between 65 and 70 with 3 consecutive normal Pap tests and no abnormal Pap or HPV tests in the past 10 years.  HPV screening.** / Every 3 years from ages 30 through ages 65 or 70 with a history of 3 consecutive normal Pap tests. Testing can be stopped between 65 and 70 with 3 consecutive normal Pap tests and no abnormal Pap or HPV tests in the past 10 years.  Fecal occult blood test (FOBT) of stool. / Every year beginning at age 50 and continuing until age 75. You may not need to do this test if you get a colonoscopy  every 10 years.  Flexible sigmoidoscopy or colonoscopy.** / Every 5 years for a flexible sigmoidoscopy or every 10 years for a colonoscopy beginning at age 50 and continuing until age 75.  Hepatitis C blood test.** / For all people born from 1945 through 1965 and any individual with known risks for hepatitis C.  Osteoporosis screening.** / A one-time screening for women ages 65 and over and women at risk for fractures or osteoporosis.  Skin self-exam. / Monthly.  Influenza vaccine. / Every year.  Tetanus, diphtheria, and acellular pertussis (Tdap/Td) vaccine.** / 1 dose of Td every 10 years.  Varicella vaccine.** / Consult your caregiver.  Zoster vaccine.** / 1 dose for adults aged 60 years or older.  Pneumococcal 13-valent conjugate (PCV13) vaccine.** / Consult your caregiver.  Pneumococcal polysaccharide (PPSV23) vaccine.** / 1 dose for all adults aged 65 years and older.  Meningococcal vaccine.** / Consult your caregiver.  Hepatitis A vaccine.** / Consult your caregiver.  Hepatitis B vaccine.** / Consult your caregiver.  Haemophilus influenzae type b (Hib) vaccine.** / Consult your caregiver. ** Family history and personal history of risk and conditions may change your caregiver's recommendations. Document Released: 04/30/2001 Document Revised: 06/29/2012 Document Reviewed: 07/30/2010 [employer] Patient Information 2014 [employer].  

## 2013-01-26 NOTE — Progress Notes (Signed)
  Subjective:     Stephanie [name] is a 32 y.o. female and is here for a comprehensive physical exam. The patient reports no problems. Daughter, Ronita Hipps, is 37 months old.  Has new job which is going well. On OC's they are working well.  Having centralized back pain related to cycles x first 2 days, which respond to heat and not as well to NSAIDS.  History   Social History  . Marital Status: Divorced    Spouse Name: N/A    Number of Children: N/A  . Years of Education: N/A   Occupational History  . Not on file.   Social History Main Topics  . Smoking status: Never Smoker   . Smokeless tobacco: Never Used  . Alcohol Use: No  . Drug Use: No  . Sexual Activity: Yes    Partners: Male    Birth Control/ Protection: Condom, Pill   Other Topics Concern  . Not on file   Social History Narrative  . No narrative on file   Health Maintenance  Topic Date Due  . Tetanus/tdap  05/27/2000  . Influenza Vaccine  10/16/2012  . Pap Smear  02/27/2015    The following portions of the patient's history were reviewed and updated as appropriate: allergies, current medications, past family history, past medical history, past social history, past surgical history and problem list.  Review of Systems Pertinent items are noted in HPI.   Objective:    BP 128/92  Pulse 72  Ht 5\' 3"  (1.6 m)  Wt 130 lb (58.968 kg)  BMI 23.03 kg/m2  LMP 01/19/2013  Breastfeeding? No General appearance: alert, cooperative and appears stated age Head: Normocephalic, without obvious abnormality, atraumatic Neck: no adenopathy, supple, symmetrical, trachea midline and thyroid not enlarged, symmetric, no tenderness/mass/nodules Lungs: clear to auscultation bilaterally Breasts: normal appearance, no masses or tenderness, fibrocystic change noted in right breast Heart: regular rate and rhythm, S1, S2 normal, no murmur, click, rub or gallop Abdomen: soft, non-tender; bowel sounds normal; no masses,  no  organomegaly Pelvic: cervix normal in appearance, external genitalia normal, no adnexal masses or tenderness, no cervical motion tenderness, vagina normal without discharge and uterus is retroverted Extremities: extremities normal, atraumatic, no cyanosis or edema Pulses: 2+ and symmetric Skin: Skin color, texture, turgor normal. No rashes or lesions Lymph nodes: Cervical, supraclavicular, and axillary nodes normal. Neurologic: Grossly normal    Assessment:    Healthy female exam. Normal pap and HPV last visit.  Recent annual labs through work.  S/p flu shot.     Plan:  Continue heat for dysmenorrhea.   See After Visit Summary for Counseling Recommendations

## 2013-04-12 IMAGING — US US OB TRANSVAGINAL
1 series · 14 of 28 positions shown · non-contrast
Comparison: none

[Series 1: us ob comp less 14 wks · 14 of 46 slices shown]
[im 2/46]
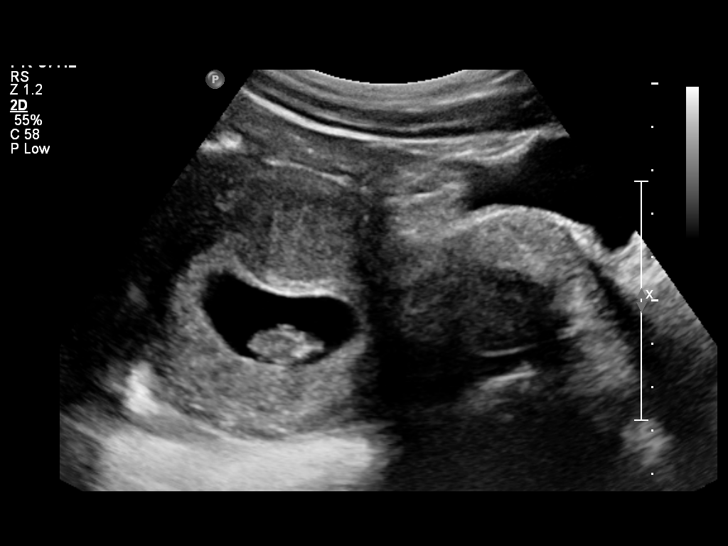
[im 6/46]
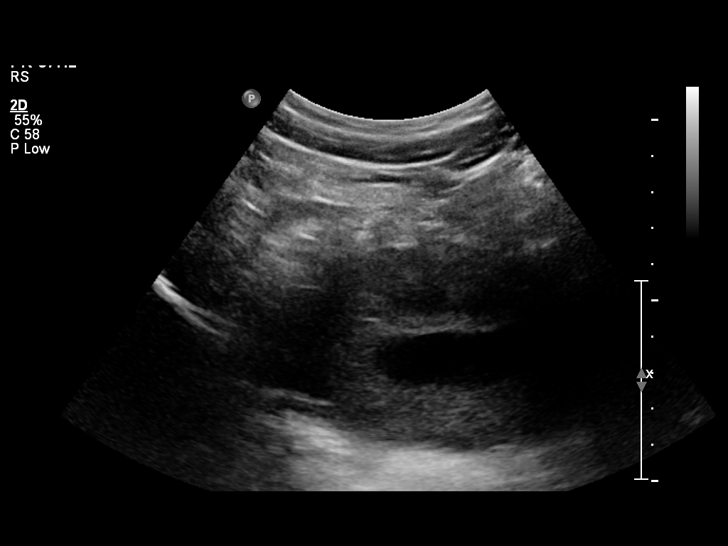
[im 9/46]
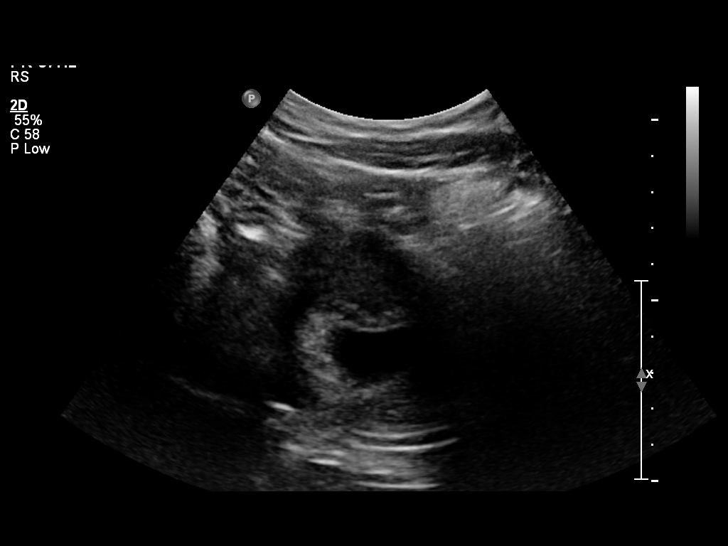
[im 12/46]
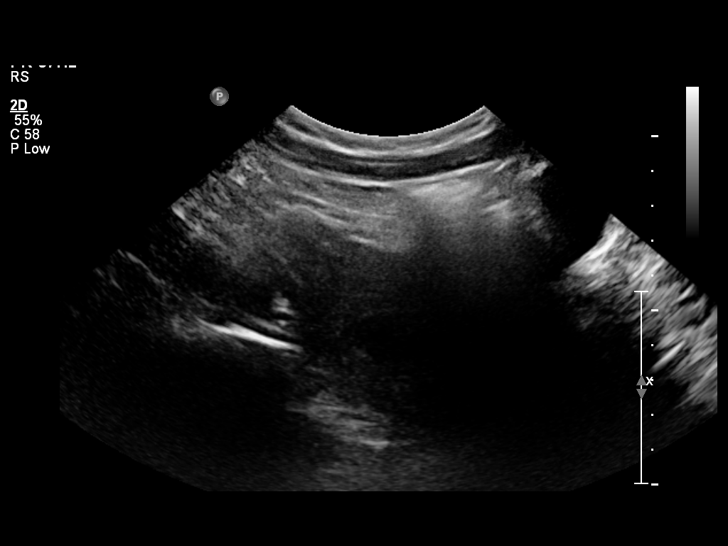
[im 16/46]
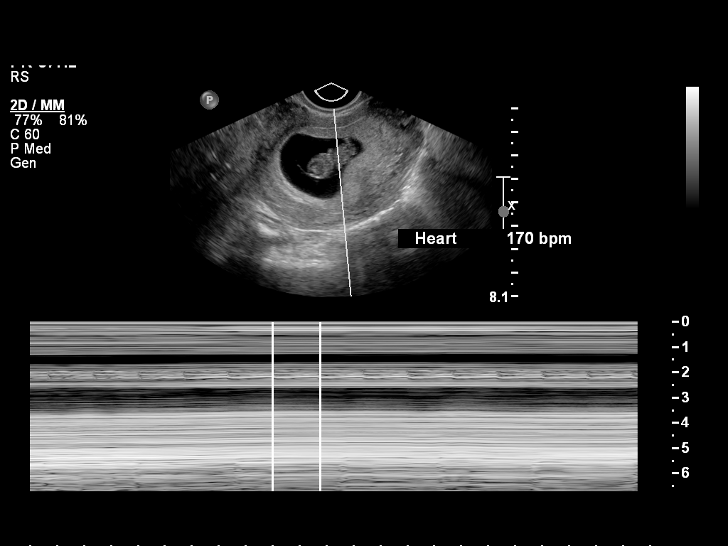
[im 19/46]
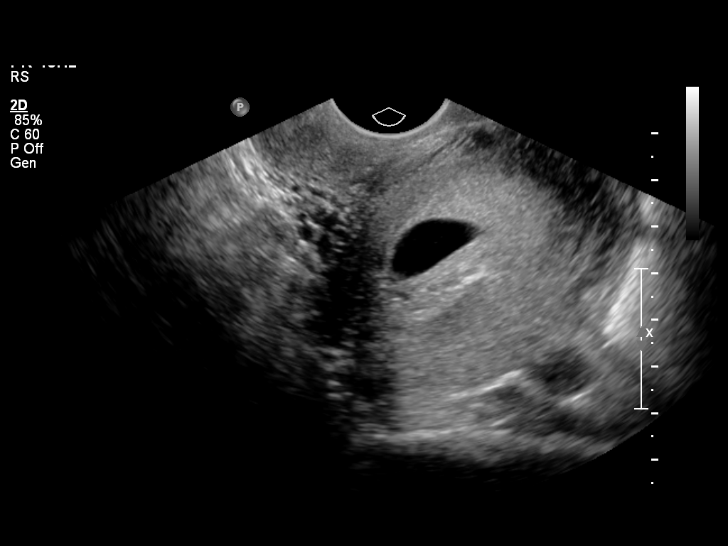
[im 22/46]
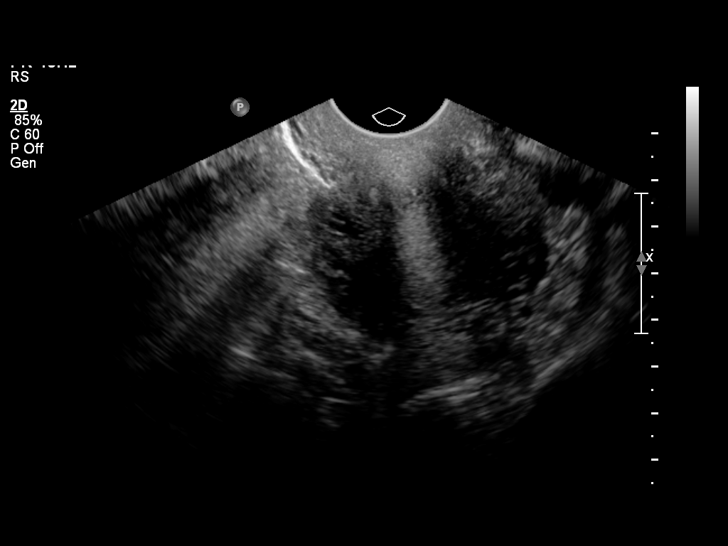
[im 26/46]
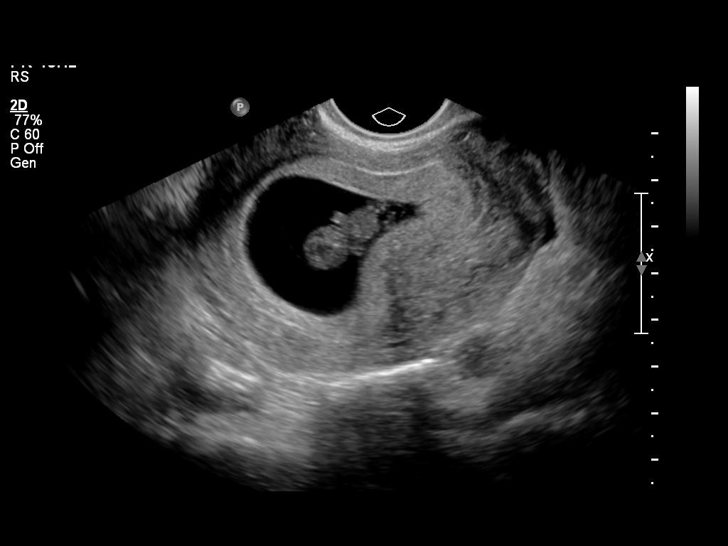
[im 29/46]
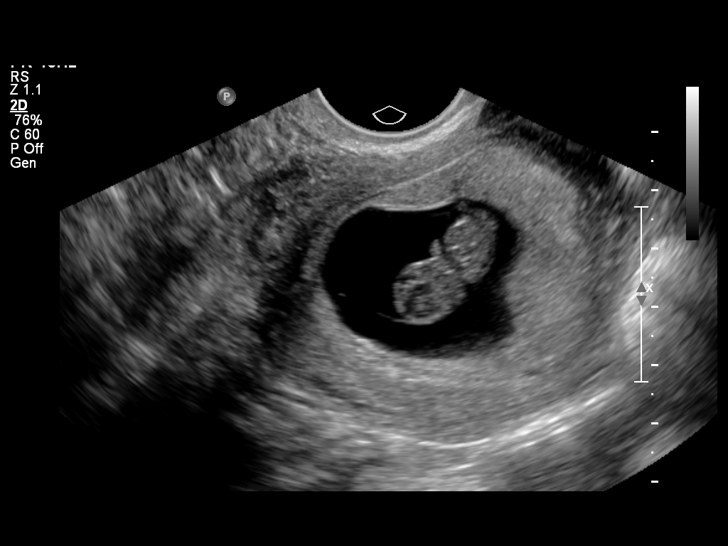
[im 32/46]
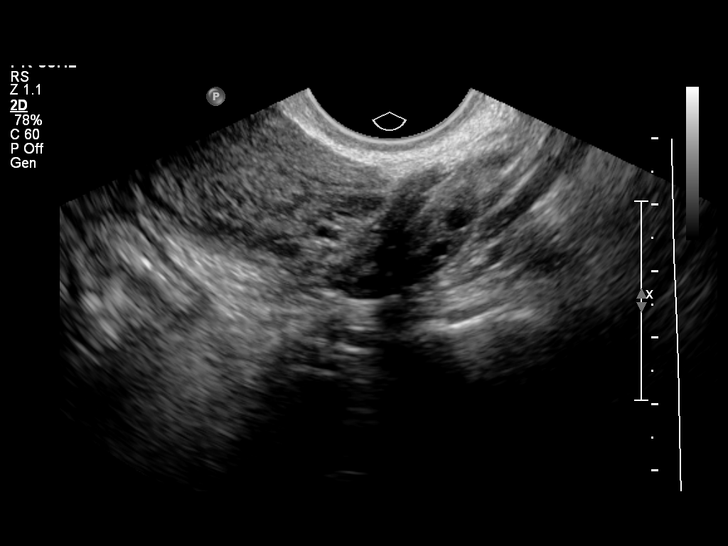
[im 36/46]
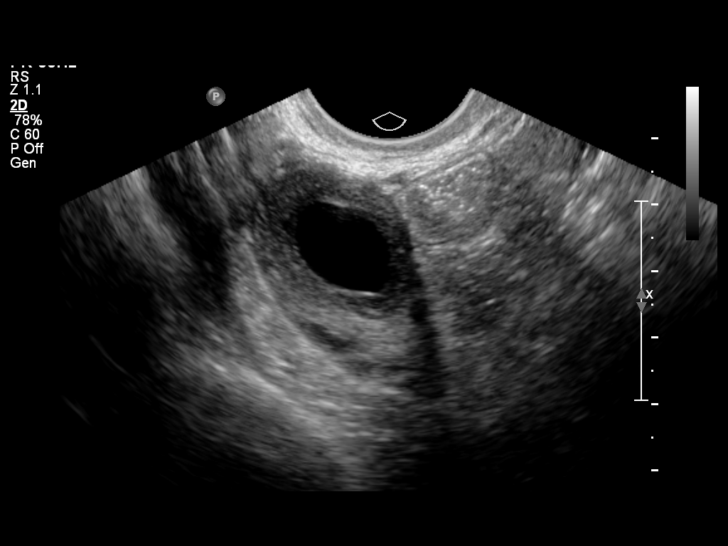
[im 39/46]
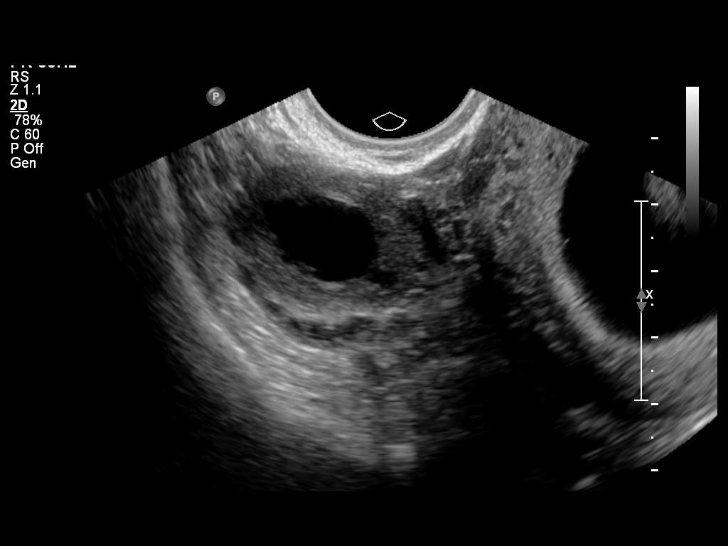
[im 42/46]
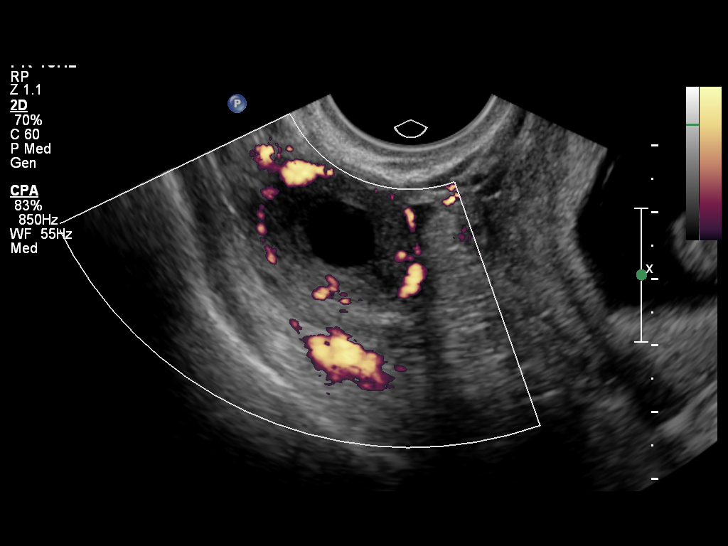
[im 46/46]
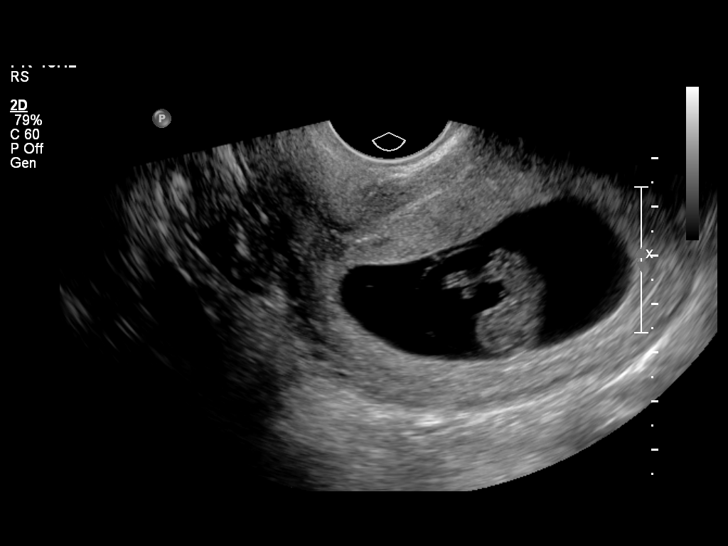

[14 of 28 positions shown; findings below may reference images not displayed]

OBSTETRICS REPORT
                      (Signed Final 12/20/2010 [DATE])

 Order#:         [id number]
                 66_O
Procedures

 US OB COMP LESS 14 WKS                                76801.0
 US OB TRANSVAGINAL                                    76817.0
Indications

 Unsure of LMP;  Establish Gestational [AGE]
Fetal Evaluation

 Gest. Sac:         Intrauterine
 Yolk Sac:          Visualized
 Fetal Pole:        Visualized
 Fetal Heart Rate:  172                          bpm
 Cardiac Activity:  Observed

 Amniotic Fluid
 AFI FV:      Subjectively within normal limits
Biometry

 CRL:     22.7  mm     G. Age:  8w 6d                  EDD:    07/26/11
Gestational Age

 Best:          8w 6d      Det. By:  U/S C R L (12/20/10)     EDD:   07/26/11
Cervix Uterus Adnexa

 Cervix:       Closed.
 Uterus:       Retroverted.
 Left Ovary:    Within normal limits.  2.6cm x .85cm  x 1.2cm
 Right Ovary:   Small corpus luteum noted. 3.6cm x 2.3cm x 2.6cm
 Adnexa:     No abnormality visualized.
Impression

 There is a single living intrauterine pregancy demonstrating
 an EGA by CRL of  8w 6d. Normal ovaries.
 questions or concerns.

## 2013-06-21 IMAGING — US US OB DETAIL+14 WK
1 of 2 series · 12 of 28 positions shown · non-contrast
Comparison: none

[Series 1: us ob detail +14 wk · 12 of 79 slices shown]
[im 1/79]
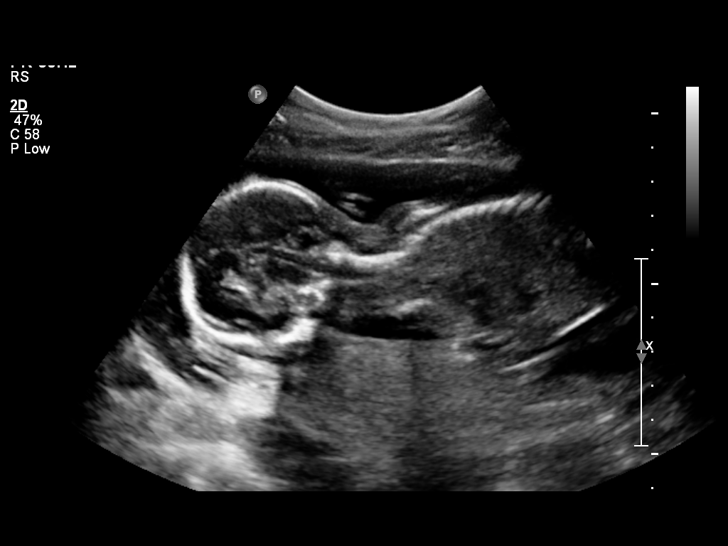
[im 7/79]
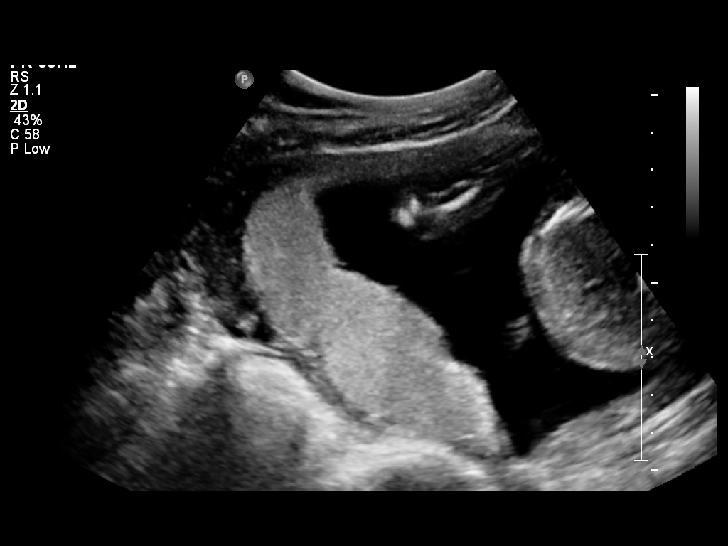
[im 13/79]
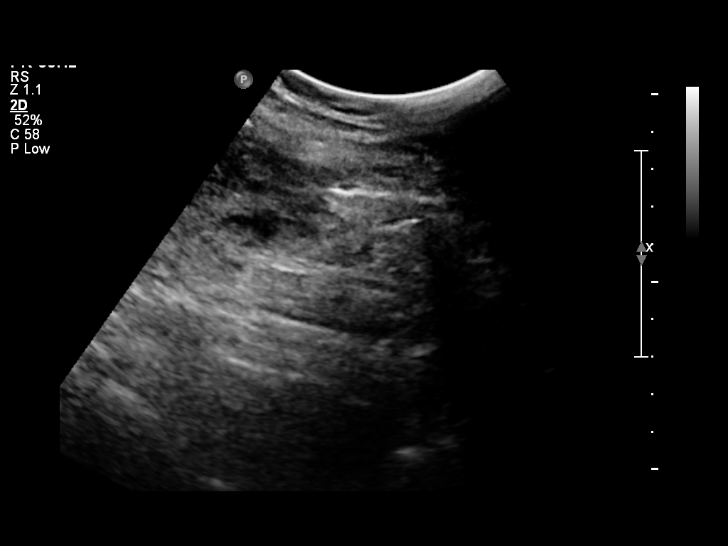
[im 22/79]
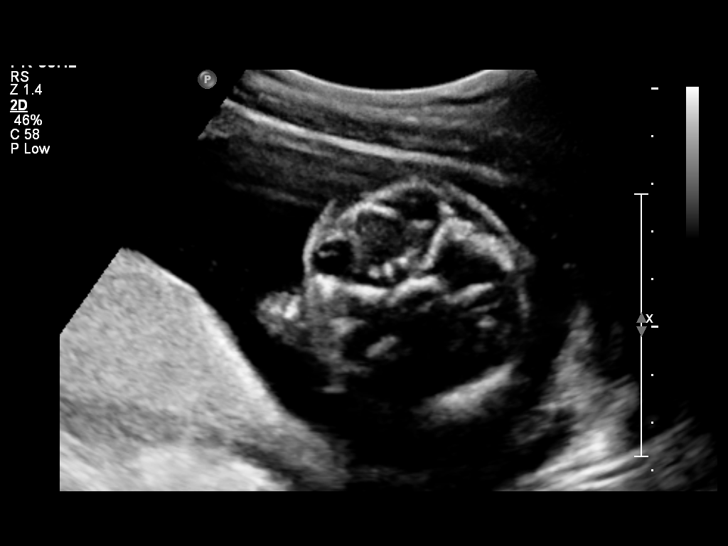
[im 28/79]
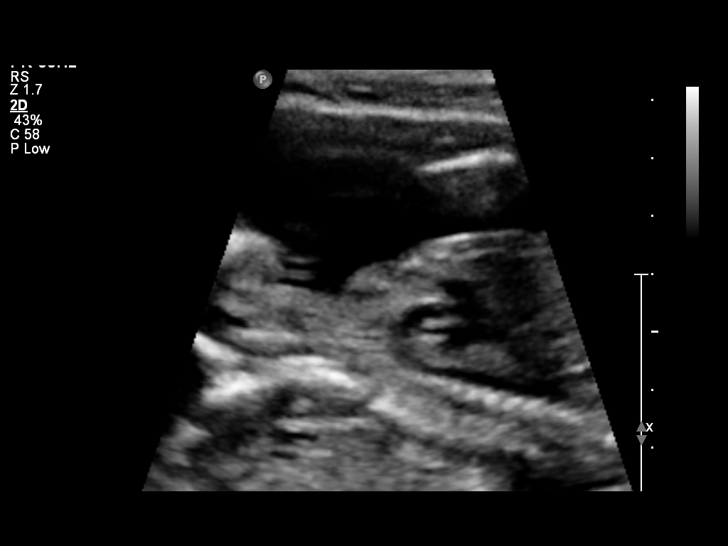
[im 34/79]
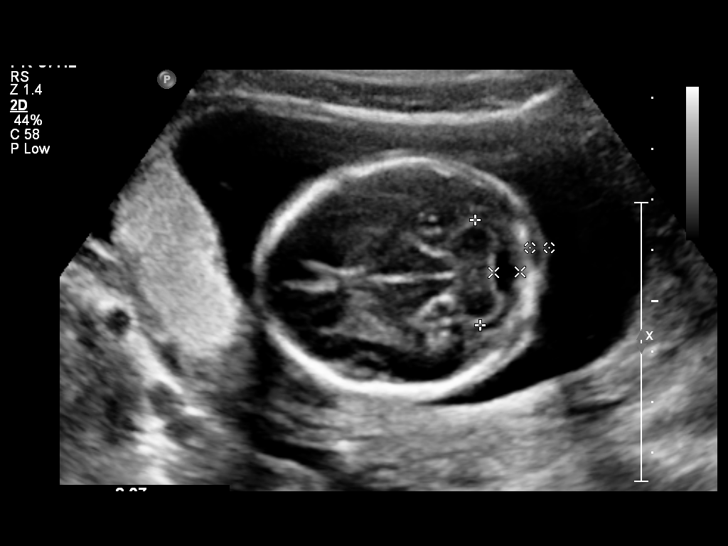
[im 43/79]
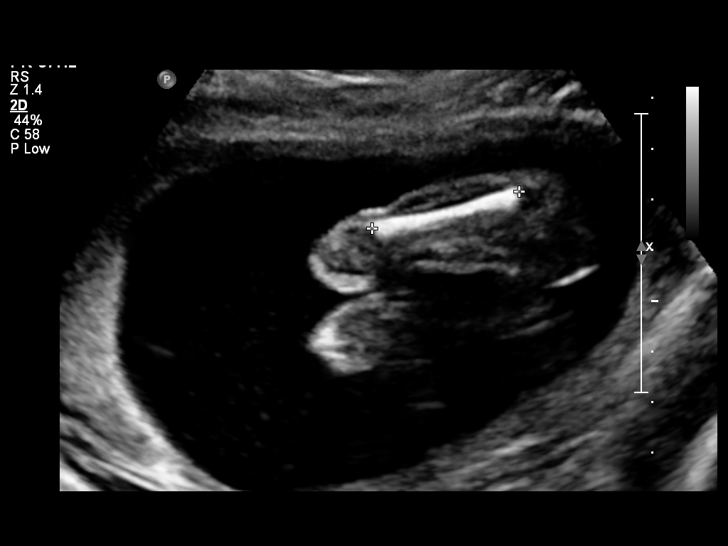
[im 49/79]
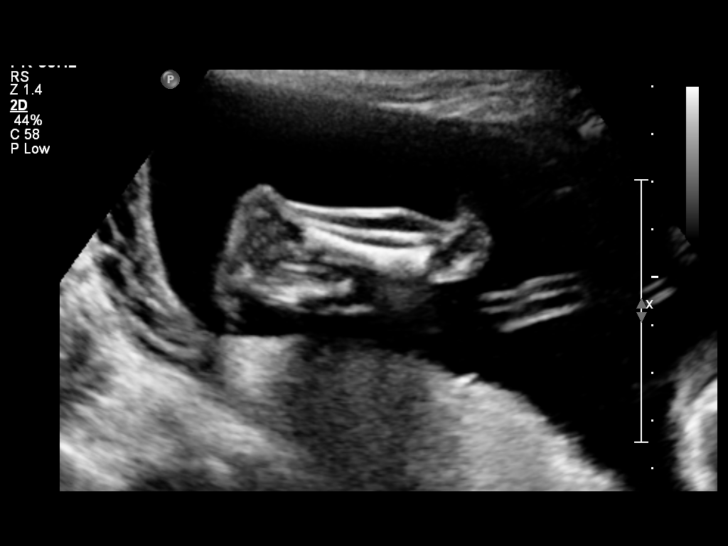
[im 55/79]
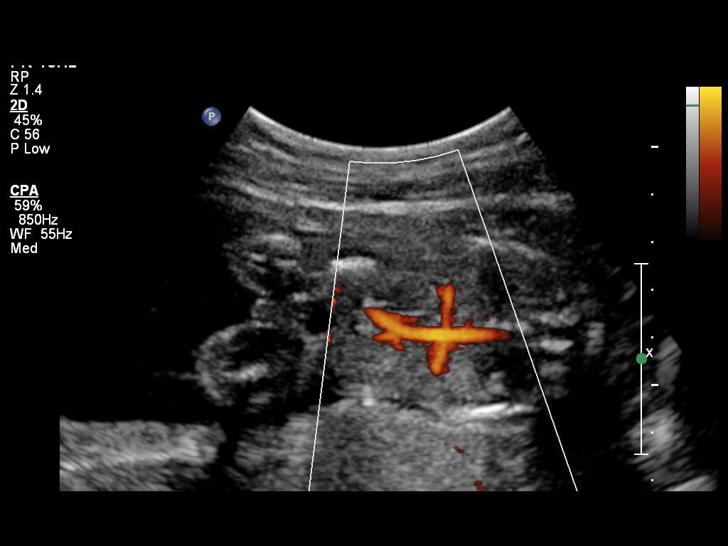
[im 64/79]
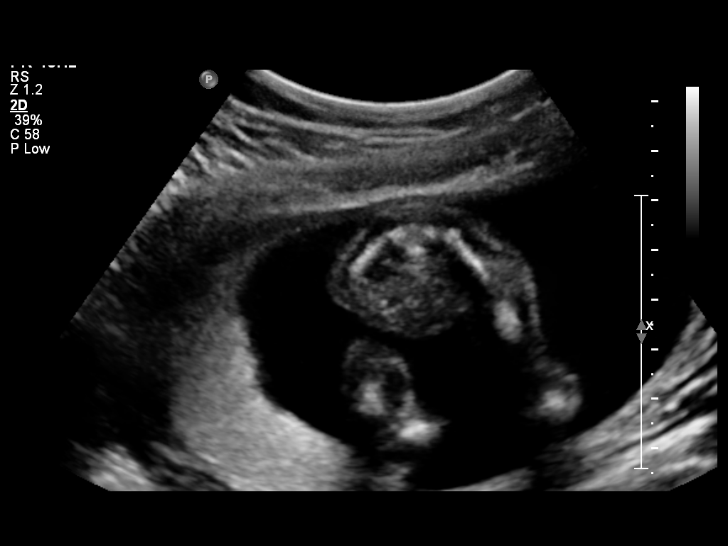
[im 70/79]
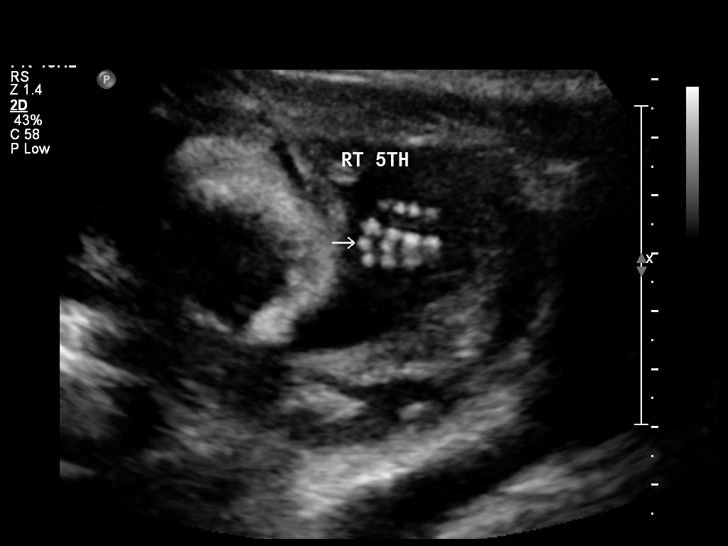
[im 76/79]
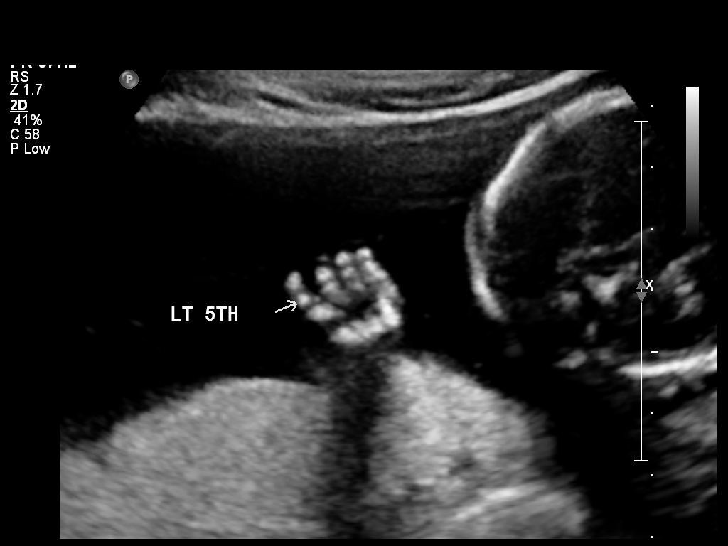

[12 of 28 positions shown; findings below may reference images not displayed]

OBSTETRICS REPORT
                      (Signed Final 02/28/2011 [DATE])

 Order#:         [id number]
Procedures

 US OB DETAIL + 14 WK                                  76811.0
Indications

 Detailed fetal anatomic survey
Fetal Evaluation

 Fetal Heart Rate:  142                         bpm
 Cardiac Activity:  Observed
 Presentation:      Transverse, head to
                    maternal right
 Placenta:          Fundal, above cervical os
 P. Cord            Visualized
 Insertion:

 Amniotic Fluid
 AFI FV:      Subjectively within normal limits
                                             Larg Pckt:     6.3  cm
Biometry

 BPD:     44.5  mm    G. Age:   19w 3d                CI:         72.9   70 - 86
                                                      FL/HC:      18.0   16.1 -

 HC:     165.7  mm    G. Age:   19w 2d       63  %    HC/AC:      1.20   1.09 -

 AC:     137.8  mm    G. Age:   19w 1d       57  %    FL/BPD:
 FL:      29.9  mm    G. Age:   19w 2d       57  %    FL/AC:      21.7   20 - 24
 HUM:     29.2  mm    G. Age:   19w 4d       68  %
 CER:     20.7  mm    G. Age:   19w 5d       71  %
 NFT:     3.76  mm

 Est. FW:     281  gm    0 lb 10 oz      52  %
Gestational Age

 U/S Today:     19w 2d                                        EDD:   07/23/11
 Best:          18w 6d    Det. By:   U/S C R L (12/20/10)     EDD:   07/26/11
Anatomy
 Cranium:           Appears normal      Aortic Arch:       Appears normal
 Fetal Cavum:       Appears normal      Ductal Arch:       Appears normal
 Ventricles:        Appears normal      Diaphragm:         Appears normal
 Choroid Plexus:    Appears normal      Stomach:           Appears
                                                           normal, left
                                                           sided
 Cerebellum:        Appears normal      Abdomen:           Appears normal
 Posterior Fossa:   Appears normal      Abdominal Wall:    Appears nml
                                                           (cord insert,
                                                           abd wall)
 Nuchal Fold:       Appears normal      Cord Vessels:      Appears normal
                    (neck, nuchal                          (3 vessel cord)
                    fold)
 Face:              Appears normal      Kidneys:           Appear normal
                    (lips/profile/orbit
                    s)
 Heart:             Echogenic           Bladder:           Appears normal
                    focus in LV
 RVOT:              Appears normal      Spine:             Appears normal
 LVOT:              Appears normal      Limbs:             Appears normal
                                                           (hands, ankles,
                                                           feet)

 Other:     Fetus appears to be a female. Heels and 5th digit
            visualized. Nasal bone visualized.
Targeted Anatomy

 Fetal Central Nervous System
 Lat. Ventricles:   6.1                 Cisterna Magna:
Cervix Uterus Adnexa

 Left Ovary:   Not visualized.
 Right Ovary:  Not visualized.

 Adnexa:     No abnormality visualized.
Impression

 Siup demonstrating an EGA by ultrasound of 19w 2d. This
 corresponds well with expected EGA by early ultrasound of
 18w 6d.

 An isolated echogenic intracardiac focus is seen. No other
 focal fetal or placental abnormalities are noted with a good
 anatomic evaluation possible. No other soft markers for Down
 Syndrome are seen. Given the expected age at delivery of
 30, today's ultrasound with an isolated soft marker would
 increase the age related risk for Down Syndrome from [DATE]
 to [DATE] (Eubank et al). Correlation with other aneuploidy
 screening results, if available, would be recommended for a
 more complete risk assessment.

 Subjectively and quantitatively normal amniotic fluid volume.
 Normal cervical length.

 questions or concerns.

## 2013-09-06 ENCOUNTER — Ambulatory Visit (INDEPENDENT_AMBULATORY_CARE_PROVIDER_SITE_OTHER): Payer: Managed Care, Other (non HMO) | Admitting: Obstetrics and Gynecology

## 2013-09-06 ENCOUNTER — Encounter: Payer: Self-pay | Admitting: Obstetrics and Gynecology

## 2013-09-06 VITALS — BP 127/95 | HR 76 | Ht 63.0 in | Wt 138.0 lb

## 2013-09-06 DIAGNOSIS — K644 Residual hemorrhoidal skin tags: Secondary | ICD-10-CM

## 2013-09-06 NOTE — Progress Notes (Signed)
Patient ID: Stephanie [name], female   DOB: [birth year], 33 y.o.   MRN: [id number] 33 yo G1P1 here for evaluation of possible hemorrhoid. Patient reports noticing bright red blood while wiping following a bowel movement 4 days ago. She denies constipation or having to strain.   GENERAL: Well-developed, well-nourished female in no acute distress.  PELVIC: Normal external female genitalia. 0.5 mm external hemorrhoid, pink, not tender to touch, not bleeding EXTREMITIES: No cyanosis, clubbing, or edema, 2+ distal pulses.  A/P 33 yo with small hemorrhoid - Advise to increase fiber intake and to avoid straining - [name] use over the counter agents for symptomatic relief if needed

## 2013-09-06 NOTE — Progress Notes (Signed)
Patient has noticed since last Thursday rectal irritation and bleeding.

## 2013-10-22 ENCOUNTER — Other Ambulatory Visit: Payer: Self-pay | Admitting: *Deleted

## 2013-10-22 DIAGNOSIS — Z3041 Encounter for surveillance of contraceptive pills: Secondary | ICD-10-CM

## 2013-10-22 MED ORDER — NORGESTIMATE-ETH ESTRADIOL 0.25-35 MG-MCG PO TABS
1.0000 | ORAL_TABLET | Freq: Every day | ORAL | Status: DC
Start: 2013-10-22 — End: 2014-02-15

## 2013-10-22 NOTE — Telephone Encounter (Signed)
Received a refill request for a 90 day supply for patients OCP.  I have sent in a 90 day supply with 1 refill to pharmacy.

## 2013-11-15 ENCOUNTER — Ambulatory Visit: Payer: Self-pay | Admitting: Unknown Physician Specialty

## 2014-01-17 ENCOUNTER — Encounter: Payer: Self-pay | Admitting: Obstetrics and Gynecology

## 2014-02-03 ENCOUNTER — Other Ambulatory Visit: Payer: Self-pay | Admitting: Family Medicine

## 2014-02-15 ENCOUNTER — Ambulatory Visit (INDEPENDENT_AMBULATORY_CARE_PROVIDER_SITE_OTHER): Payer: Managed Care, Other (non HMO) | Admitting: Obstetrics & Gynecology

## 2014-02-15 ENCOUNTER — Encounter: Payer: Self-pay | Admitting: Obstetrics & Gynecology

## 2014-02-15 VITALS — BP 124/87 | HR 89 | Ht 63.0 in | Wt 134.8 lb

## 2014-02-15 DIAGNOSIS — Z01419 Encounter for gynecological examination (general) (routine) without abnormal findings: Secondary | ICD-10-CM

## 2014-02-15 DIAGNOSIS — Z124 Encounter for screening for malignant neoplasm of cervix: Secondary | ICD-10-CM

## 2014-02-15 DIAGNOSIS — Z113 Encounter for screening for infections with a predominantly sexual mode of transmission: Secondary | ICD-10-CM

## 2014-02-15 DIAGNOSIS — Z0000 Encounter for general adult medical examination without abnormal findings: Secondary | ICD-10-CM

## 2014-02-15 DIAGNOSIS — Z1151 Encounter for screening for human papillomavirus (HPV): Secondary | ICD-10-CM

## 2014-02-15 MED ORDER — NORGESTIMATE-ETH ESTRADIOL 0.25-35 MG-MCG PO TABS: 1.0000 | ORAL_TABLET | Freq: Every day | ORAL | Status: DC

## 2014-02-15 NOTE — Progress Notes (Signed)
Subjective:    Stephanie [name] is a 33 y.o. engagedW P70 ( 2 1/2 yo daughter) female who presents for an annual exam. The patient has no complaints today. The patient is sexually active. GYN screening history: last pap: was normal. The patient wears seatbelts: yes. The patient participates in regular exercise: yes. (walking and weights) Has the patient ever been transfused or tattooed?: no. The patient reports that there is not domestic violence in her life. "Not anymore".  Menstrual History: OB History    Gravida Para Term Preterm AB TAB SAB Ectopic Multiple Living   1 1 1  0 0 0 0 0 0 1      Menarche age: 70 Patient's last menstrual period was 02/15/2014 (exact date).    The following portions of the patient's history were reviewed and updated as appropriate: allergies, current medications, past family history, past medical history, past social history, past surgical history and problem list.  Review of Systems A comprehensive review of systems was negative. Wedding planned for 10/16. Denies dyspareunia. Uses condoms and OCPs for contraception.   Objective:    BP 124/87 mmHg  Pulse 89  Ht 5\' 3"  (1.6 m)  Wt 134 lb 12.8 oz (61.145 kg)  BMI 23.88 kg/m2  LMP 02/15/2014 (Exact Date)  General Appearance:    Alert, cooperative, no distress, appears stated age  Head:    Normocephalic, without obvious abnormality, atraumatic  Eyes:    PERRL, conjunctiva/corneas clear, EOM's intact, fundi    benign, both eyes  Ears:    Normal TM's and external ear canals, both ears  Nose:   Nares normal, septum midline, mucosa normal, no drainage    or sinus tenderness  Throat:   Lips, mucosa, and tongue normal; teeth and gums normal  Neck:   Supple, symmetrical, trachea midline, no adenopathy;    thyroid:  no enlargement/tenderness/nodules; no carotid   bruit or JVD  Back:     Symmetric, no curvature, ROM normal, no CVA tenderness  Lungs:     Clear to auscultation bilaterally, respirations unlabored   Chest Wall:    No tenderness or deformity   Heart:    Regular rate and rhythm, S1 and S2 normal, no murmur, rub   or gallop  Breast Exam:    No tenderness, masses, or nipple abnormality  Abdomen:     Soft, non-tender, bowel sounds active all four quadrants,    no masses, no organomegaly  Genitalia:    Normal female without lesion, discharge or tenderness, shaved, NSSA, NT, minimal mobility, normal adnexal exam     Extremities:   Extremities normal, atraumatic, no cyanosis or edema  Pulses:   2+ and symmetric all extremities  Skin:   Skin color, texture, turgor normal, no rashes or lesions  Lymph nodes:   Cervical, supraclavicular, and axillary nodes normal  Neurologic:   CNII-XII intact, normal strength, sensation and reflexes    throughout  .    Assessment:    Healthy female exam.    Plan:     Breast self exam technique reviewed and patient encouraged to perform self-exam monthly. Chlamydia specimen. GC specimen. Thin prep Pap smear.   Refill OCPs

## 2014-02-16 LAB — CYTOLOGY - PAP

## 2014-04-28 ENCOUNTER — Other Ambulatory Visit: Payer: Self-pay | Admitting: Family Medicine

## 2014-10-09 ENCOUNTER — Other Ambulatory Visit: Payer: Self-pay | Admitting: Family Medicine

## 2015-02-24 ENCOUNTER — Ambulatory Visit: Payer: Managed Care, Other (non HMO) | Admitting: Family Medicine

## 2015-03-28 ENCOUNTER — Ambulatory Visit: Payer: Managed Care, Other (non HMO) | Admitting: Family Medicine

## 2015-03-30 ENCOUNTER — Other Ambulatory Visit: Payer: Self-pay | Admitting: Family Medicine

## 2015-04-12 ENCOUNTER — Encounter: Payer: Self-pay | Admitting: Family Medicine

## 2015-04-12 ENCOUNTER — Ambulatory Visit (INDEPENDENT_AMBULATORY_CARE_PROVIDER_SITE_OTHER): Payer: BLUE CROSS/BLUE SHIELD | Admitting: Family Medicine

## 2015-04-12 VITALS — BP 142/88 | HR 90 | Resp 18 | Ht 63.0 in | Wt 142.0 lb

## 2015-04-12 DIAGNOSIS — Z01419 Encounter for gynecological examination (general) (routine) without abnormal findings: Secondary | ICD-10-CM

## 2015-04-12 DIAGNOSIS — F419 Anxiety disorder, unspecified: Secondary | ICD-10-CM

## 2015-04-12 DIAGNOSIS — F411 Generalized anxiety disorder: Secondary | ICD-10-CM | POA: Diagnosis not present

## 2015-04-12 DIAGNOSIS — Z3041 Encounter for surveillance of contraceptive pills: Secondary | ICD-10-CM

## 2015-04-12 HISTORY — DX: Anxiety disorder, unspecified: F41.9

## 2015-04-12 MED ORDER — NORGESTIMATE-ETH ESTRADIOL 0.25-35 MG-MCG PO TABS: 1.0000 | ORAL_TABLET | Freq: Every day | ORAL | Status: DC

## 2015-04-12 NOTE — Progress Notes (Signed)
  Subjective:     Stephanie [name] is a 35 y.o. female and is here for a comprehensive physical exam. The patient reports problems - increasing anxiety in taking on more family stress. Recently married. Having trouble driving due to anxiety..  Social History   Social History  . Marital Status: Single    Spouse Name: N/A  . Number of Children: N/A  . Years of Education: N/A   Occupational History  . Not on file.   Social History Main Topics  . Smoking status: Never Smoker   . Smokeless tobacco: Never Used  . Alcohol Use: No  . Drug Use: No  . Sexual Activity:    Partners: Male    Birth Control/ Protection: Pill   Other Topics Concern  . Not on file   Social History Narrative   Health Maintenance  Topic Date Due  . INFLUENZA VACCINE  04/11/2016 (Originally 10/17/2014)  . PAP SMEAR  02/15/2017  . TETANUS/TDAP  01/27/2023  . HIV Screening  Completed    The following portions of the patient's history were reviewed and updated as appropriate: allergies, current medications, past family history, past medical history, past social history, past surgical history and problem list.  Review of Systems Pertinent items noted in HPI and remainder of comprehensive ROS otherwise negative.   Objective:    BP 142/88 mmHg  Pulse 90  Resp 18  Ht 5\' 3"  (1.6 m)  Wt 142 lb (64.411 kg)  BMI 25.16 kg/m2  LMP 04/03/2015 General appearance: alert, cooperative and appears stated age Head: Normocephalic, without obvious abnormality, atraumatic Neck: no adenopathy, supple, symmetrical, trachea midline and thyroid not enlarged, symmetric, no tenderness/mass/nodules Lungs: clear to auscultation bilaterally Breasts: normal appearance, no masses or tenderness Heart: regular rate and rhythm, S1, S2 normal, no murmur, click, rub or gallop Abdomen: soft, non-tender; bowel sounds normal; no masses,  no organomegaly Pelvic: cervix normal in appearance, external genitalia normal, no adnexal masses or  tenderness, no cervical motion tenderness, uterus normal size, shape, and consistency and vagina normal without discharge Extremities: extremities normal, atraumatic, no cyanosis or edema Pulses: 2+ and symmetric Skin: Skin color, texture, turgor normal. No rashes or lesions Lymph nodes: Cervical, supraclavicular, and axillary nodes normal. Neurologic: Grossly normal    Assessment:    Healthy female exam.      Plan:     1. Generalized anxiety disorder  - Ambulatory referral to Psychology  2. Surveillance of previously prescribed contraceptive pill Continue for now--unsure if she wants more kids. - norgestimate-ethinyl estradiol (SPRINTEC 28) 0.25-35 MG-MCG tablet; Take 1 tablet by mouth daily.  Dispense: 28 tablet; Refill: 16  3. Encounter for routine gynecological examination No need for pap--neg HPV and normal cytology 02/2014.  See After Visit Summary for Counseling Recommendations

## 2015-04-12 NOTE — Patient Instructions (Signed)
Generalized Anxiety Disorder Generalized anxiety disorder (GAD) is a mental disorder. It interferes with life functions, including relationships, work, and school. GAD is different from normal anxiety, which everyone experiences at some point in their lives in response to specific life events and activities. Normal anxiety actually helps [place] prepare for and get through these life events and activities. Normal anxiety goes away after the event or activity is over.  GAD causes anxiety that is not necessarily related to specific events or activities. It also causes excess anxiety in proportion to specific events or activities. The anxiety associated with GAD is also difficult to control. GAD can vary from mild to severe. People with severe GAD can have intense waves of anxiety with physical symptoms (panic attacks).  SYMPTOMS The anxiety and worry associated with GAD are difficult to control. This anxiety and worry are related to many life events and activities and also occur more days than not for 6 months or longer. People with GAD also have three or more of the following symptoms (one or more in children): Restlessness.  Fatigue. Difficulty concentrating.  Irritability. Muscle tension. Difficulty sleeping or unsatisfying sleep. DIAGNOSIS GAD is diagnosed through an assessment by your health care provider. Your health care provider will ask you questions aboutyour mood,physical symptoms, and events in your life. Your health care provider may ask you about your medical history and use of alcohol or drugs, including prescription medicines. Your health care provider may also do a physical exam and blood tests. Certain medical conditions and the use of certain substances can cause symptoms similar to those associated with GAD. Your health care provider may refer you to a mental health specialist for further evaluation. TREATMENT The following therapies are usually used to treat GAD:  Medication.  Antidepressant medication usually is prescribed for long-term daily control. Antianxiety medicines may be added in severe cases, especially when panic attacks occur.  Talk therapy (psychotherapy). Certain types of talk therapy can be helpful in treating GAD by providing support, education, and guidance. A form of talk therapy called cognitive behavioral therapy can teach you healthy ways to think about and react to daily life events and activities. Stress managementtechniques. These include yoga, meditation, and exercise and can be very helpful when they are practiced regularly. A mental health specialist can help determine which treatment is best for you. Some people see improvement with one therapy. However, other people require a combination of therapies.   This information is not intended to replace advice given to you by your health care provider. Make sure you discuss any questions you have with your health care provider.   Document Released: 06/29/2012 Document Revised: 03/25/2014 Document Reviewed: 06/29/2012 Elsevier Interactive Patient Education 2016 [place] for Adults, Female A healthy lifestyle and preventive care can promote health and wellness. Preventive health guidelines for women include the following key practices.  A routine yearly physical is a good way to check with your health care provider about your health and preventive screening. It is a chance to share any concerns and updates on your health and to receive a thorough exam.  Visit your dentist for a routine exam and preventive care every 6 months. Brush your teeth twice a day and floss once a day. Good oral hygiene prevents tooth decay and gum disease.  The frequency of eye exams is based on your age, health, family medical history, use of contact lenses, and other factors. Follow your health care provider's recommendations for frequency of eye exams.  Eat a healthy diet. Foods like vegetables,  fruits, whole grains, low-fat dairy products, and lean protein foods contain the nutrients you need without too many calories. Decrease your intake of foods high in solid fats, added sugars, and salt. Eat the right amount of calories for you.Get information about a proper diet from your health care provider, if necessary.  Regular physical exercise is one of the most important things you can do for your health. Most adults should get at least 150 minutes of moderate-intensity exercise (any activity that increases your heart rate and causes you to sweat) each week. In addition, most adults need muscle-strengthening exercises on 2 or more days a week.  Maintain a healthy weight. The body mass index (BMI) is a screening tool to identify possible weight problems. It provides an estimate of body fat based on height and weight. Your health care provider can find your BMI and can help you achieve or maintain a healthy weight.For adults 20 years and older:  A BMI below 18.5 is considered underweight.  A BMI of 18.5 to 24.9 is normal.  A BMI of 25 to 29.9 is considered overweight.  A BMI of 30 and above is considered obese.  Maintain normal blood lipids and cholesterol levels by exercising and minimizing your intake of saturated fat. Eat a balanced diet with plenty of fruit and vegetables. Blood tests for lipids and cholesterol should begin at age 3 and be repeated every 5 years. If your lipid or cholesterol levels are high, you are over 50, or you are at high risk for heart disease, you may need your cholesterol levels checked more frequently.Ongoing high lipid and cholesterol levels should be treated with medicines if diet and exercise are not working.  If you smoke, find out from your health care provider how to quit. If you do not use tobacco, do not start.  Lung cancer screening is recommended for adults aged 28-80 years who are at high risk for developing lung cancer because of a history of  smoking. A yearly low-dose CT scan of the lungs is recommended for people who have at least a 30-pack-year history of smoking and are a current smoker or have quit within the past 15 years. A pack year of smoking is smoking an average of 1 pack of cigarettes a day for 1 year (for example: 1 pack a day for 30 years or 2 packs a day for 15 years). Yearly screening should continue until the smoker has stopped smoking for at least 15 years. Yearly screening should be stopped for people who develop a health problem that would prevent them from having lung cancer treatment.  If you are pregnant, do not drink alcohol. If you are breastfeeding, be very cautious about drinking alcohol. If you are not pregnant and choose to drink alcohol, do not have more than 1 drink per day. One drink is considered to be 12 ounces (355 mL) of beer, 5 ounces (148 mL) of wine, or 1.5 ounces (44 mL) of liquor.  Avoid use of street drugs. Do not share needles with anyone. Ask for help if you need support or instructions about stopping the use of drugs.  High blood pressure causes heart disease and increases the risk of stroke. Your blood pressure should be checked at least every 1 to 2 years. Ongoing high blood pressure should be treated with medicines if weight loss and exercise do not work.  If you are 4-67 years old, ask your health care provider if  you should take aspirin to prevent strokes.  Diabetes screening is done by taking a blood sample to check your blood glucose level after you have not eaten for a certain period of time (fasting). If you are not overweight and you do not have risk factors for diabetes, you should be screened once every 3 years starting at age 19. If you are overweight or obese and you are 54-50 years of age, you should be screened for diabetes every year as part of your cardiovascular risk assessment.  Breast cancer screening is essential preventive care for women. You should practice "breast  self-awareness." This means understanding the normal appearance and feel of your breasts and may include breast self-examination. Any changes detected, no matter how small, should be reported to a health care provider. Women in their 53s and 30s should have a clinical breast exam (CBE) by a health care provider as part of a regular health exam every 1 to 3 years. After age 68, women should have a CBE every year. Starting at age 13, women should consider having a mammogram (breast X-ray test) every year. Women who have a family history of breast cancer should talk to their health care provider about genetic screening. Women at a high risk of breast cancer should talk to their health care providers about having an MRI and a mammogram every year.  Breast cancer gene (BRCA)-related cancer risk assessment is recommended for women who have family members with BRCA-related cancers. BRCA-related cancers include breast, ovarian, tubal, and peritoneal cancers. Having family members with these cancers may be associated with an increased risk for harmful changes (mutations) in the breast cancer genes BRCA1 and BRCA2. Results of the assessment will determine the need for genetic counseling and BRCA1 and BRCA2 testing.  Your health care provider may recommend that you be screened regularly for cancer of the pelvic organs (ovaries, uterus, and vagina). This screening involves a pelvic examination, including checking for microscopic changes to the surface of your cervix (Pap test). You may be encouraged to have this screening done every 3 years, beginning at age 69.  For women ages 26-65, health care providers may recommend pelvic exams and Pap testing every 3 years, or they may recommend the Pap and pelvic exam, combined with testing for human papilloma virus (HPV), every 5 years. Some types of HPV increase your risk of cervical cancer. Testing for HPV may also be done on women of any age with unclear Pap test  results.  Other health care providers may not recommend any screening for nonpregnant women who are considered low risk for pelvic cancer and who do not have symptoms. Ask your health care provider if a screening pelvic exam is right for you.  If you have had past treatment for cervical cancer or a condition that could lead to cancer, you need Pap tests and screening for cancer for at least 20 years after your treatment. If Pap tests have been discontinued, your risk factors (such as having a new sexual partner) need to be reassessed to determine if screening should resume. Some women have medical problems that increase the chance of getting cervical cancer. In these cases, your health care provider may recommend more frequent screening and Pap tests.  Colorectal cancer can be detected and often prevented. Most routine colorectal cancer screening begins at the age of 7 years and continues through age 50 years. However, your health care provider may recommend screening at an earlier age if you have risk factors for  colon cancer. On a yearly basis, your health care provider may provide home test kits to check for hidden blood in the stool. Use of a small camera at the end of a tube, to directly examine the colon (sigmoidoscopy or colonoscopy), can detect the earliest forms of colorectal cancer. Talk to your health care provider about this at age 46, when routine screening begins. Direct exam of the colon should be repeated every 5-10 years through age 53 years, unless early forms of precancerous polyps or small growths are found.  People who are at an increased risk for hepatitis B should be screened for this virus. You are considered at high risk for hepatitis B if:  You were born in a country where hepatitis B occurs often. Talk with your health care provider about which countries are considered high risk.  Your parents were born in a high-risk country and you have not received a shot to protect  against hepatitis B (hepatitis B vaccine).  You have HIV or AIDS.  You use needles to inject street drugs.  You live with, or have sex with, someone who has hepatitis B.  You get hemodialysis treatment.  You take certain medicines for conditions like cancer, organ transplantation, and autoimmune conditions.  Hepatitis C blood testing is recommended for all people born from [birth year] through 1964 and any individual with known risks for hepatitis C.  Practice safe sex. Use condoms and avoid high-risk sexual practices to reduce the spread of sexually transmitted infections (STIs). STIs include gonorrhea, chlamydia, syphilis, trichomonas, herpes, HPV, and human immunodeficiency virus (HIV). Herpes, HIV, and HPV are viral illnesses that have no cure. They can result in disability, cancer, and death.  You should be screened for sexually transmitted illnesses (STIs) including gonorrhea and chlamydia if:  You are sexually active and are younger than 24 years.  You are older than 24 years and your health care provider tells you that you are at risk for this type of infection.  Your sexual activity has changed since you were last screened and you are at an increased risk for chlamydia or gonorrhea. Ask your health care provider if you are at risk.  If you are at risk of being infected with HIV, it is recommended that you take a prescription medicine daily to prevent HIV infection. This is called preexposure prophylaxis (PrEP). You are considered at risk if:  You are sexually active and do not regularly use condoms or know the HIV status of your partner(s).  You take drugs by injection.  You are sexually active with a partner who has HIV.  Talk with your health care provider about whether you are at high risk of being infected with HIV. If you choose to begin PrEP, you should first be tested for HIV. You should then be tested every 3 months for as long as you are taking PrEP.  Osteoporosis is a  disease in which the bones lose minerals and strength with aging. This can result in serious bone fractures or breaks. The risk of osteoporosis can be identified using a bone density scan. Women ages 58 years and over and women at risk for fractures or osteoporosis should discuss screening with their health care providers. Ask your health care provider whether you should take a calcium supplement or vitamin D to reduce the rate of osteoporosis.  Menopause can be associated with physical symptoms and risks. Hormone replacement therapy is available to decrease symptoms and risks. You should talk to your health care  provider about whether hormone replacement therapy is right for you.  Use sunscreen. Apply sunscreen liberally and repeatedly throughout the day. You should seek shade when your shadow is shorter than you. Protect yourself by wearing long sleeves, pants, a wide-brimmed hat, and sunglasses year round, whenever you are outdoors.  Once a month, do a whole body skin exam, using a mirror to look at the skin on your back. Tell your health care provider of new moles, moles that have irregular borders, moles that are larger than a pencil eraser, or moles that have changed in shape or color.  Stay current with required vaccines (immunizations).  Influenza vaccine. All adults should be immunized every year.  Tetanus, diphtheria, and acellular pertussis (Td, Tdap) vaccine. Pregnant women should receive 1 dose of Tdap vaccine during each pregnancy. The dose should be obtained regardless of the length of time since the last dose. Immunization is preferred during the 27th-36th week of gestation. An adult who has not previously received Tdap or who does not know her vaccine status should receive 1 dose of Tdap. This initial dose should be followed by tetanus and diphtheria toxoids (Td) booster doses every 10 years. Adults with an unknown or incomplete history of completing a 3-dose immunization series with  Td-containing vaccines should begin or complete a primary immunization series including a Tdap dose. Adults should receive a Td booster every 10 years.  Varicella vaccine. An adult without evidence of immunity to varicella should receive 2 doses or a second dose if she has previously received 1 dose. Pregnant females who do not have evidence of immunity should receive the first dose after pregnancy. This first dose should be obtained before leaving the health care facility. The second dose should be obtained 4-8 weeks after the first dose.  Human papillomavirus (HPV) vaccine. Females aged 13-26 years who have not received the vaccine previously should obtain the 3-dose series. The vaccine is not recommended for use in pregnant females. However, pregnancy testing is not needed before receiving a dose. If a female is found to be pregnant after receiving a dose, no treatment is needed. In that case, the remaining doses should be delayed until after the pregnancy. Immunization is recommended for any person with an immunocompromised condition through the age of 83 years if she did not get any or all doses earlier. During the 3-dose series, the second dose should be obtained 4-8 weeks after the first dose. The third dose should be obtained 24 weeks after the first dose and 16 weeks after the second dose.  Zoster vaccine. One dose is recommended for adults aged 18 years or older unless certain conditions are present.  Measles, mumps, and rubella (MMR) vaccine. Adults born before [birth year] generally are considered immune to measles and mumps. Adults born in [birth year] or later should have 1 or more doses of MMR vaccine unless there is a contraindication to the vaccine or there is laboratory evidence of immunity to each of the three diseases. A routine second dose of MMR vaccine should be obtained at least 28 days after the first dose for students attending postsecondary schools, health care workers, or international travelers.  People who received inactivated measles vaccine or an unknown type of measles vaccine during 1963-1967 should receive 2 doses of MMR vaccine. People who received inactivated mumps vaccine or an unknown type of mumps vaccine before 1979 and are at high risk for mumps infection should consider immunization with 2 doses of MMR vaccine. For females of childbearing age,  rubella immunity should be determined. If there is no evidence of immunity, females who are not pregnant should be vaccinated. If there is no evidence of immunity, females who are pregnant should delay immunization until after pregnancy. Unvaccinated health care workers born before [birth year] who lack laboratory evidence of measles, mumps, or rubella immunity or laboratory confirmation of disease should consider measles and mumps immunization with 2 doses of MMR vaccine or rubella immunization with 1 dose of MMR vaccine.  Pneumococcal 13-valent conjugate (PCV13) vaccine. When indicated, a person who is uncertain of his immunization history and has no record of immunization should receive the PCV13 vaccine. All adults 53 years of age and older should receive this vaccine. An adult aged 22 years or older who has certain medical conditions and has not been previously immunized should receive 1 dose of PCV13 vaccine. This PCV13 should be followed with a dose of pneumococcal polysaccharide (PPSV23) vaccine. Adults who are at high risk for pneumococcal disease should obtain the PPSV23 vaccine at least 8 weeks after the dose of PCV13 vaccine. Adults older than 34 years of age who have normal immune system function should obtain the PPSV23 vaccine dose at least 1 year after the dose of PCV13 vaccine.  Pneumococcal polysaccharide (PPSV23) vaccine. When PCV13 is also indicated, PCV13 should be obtained first. All adults aged 6 years and older should be immunized. An adult younger than age 78 years who has certain medical conditions should be immunized. Any person  who resides in a nursing home or long-term care facility should be immunized. An adult smoker should be immunized. People with an immunocompromised condition and certain other conditions should receive both PCV13 and PPSV23 vaccines. People with human immunodeficiency virus (HIV) infection should be immunized as soon as possible after diagnosis. Immunization during chemotherapy or radiation therapy should be avoided. Routine use of PPSV23 vaccine is not recommended for American Indians, [place] Natives, or people younger than 65 years unless there are medical conditions that require PPSV23 vaccine. When indicated, people who have unknown immunization and have no record of immunization should receive PPSV23 vaccine. One-time revaccination 5 years after the first dose of PPSV23 is recommended for people aged 19-64 years who have chronic kidney failure, nephrotic syndrome, asplenia, or immunocompromised conditions. People who received 1-2 doses of PPSV23 before age 67 years should receive another dose of PPSV23 vaccine at age 80 years or later if at least 5 years have passed since the previous dose. Doses of PPSV23 are not needed for people immunized with PPSV23 at or after age 36 years.  Meningococcal vaccine. Adults with asplenia or persistent complement component deficiencies should receive 2 doses of quadrivalent meningococcal conjugate (MenACWY-D) vaccine. The doses should be obtained at least 2 months apart. Microbiologists working with certain meningococcal bacteria, [place] recruits, people at risk during an outbreak, and people who travel to or live in countries with a high rate of meningitis should be immunized. A first-year college student up through age 89 years who is living in a residence hall should receive a dose if she did not receive a dose on or after her 16th birthday. Adults who have certain high-risk conditions should receive one or more doses of vaccine.  Hepatitis A vaccine. Adults who wish  to be protected from this disease, have certain high-risk conditions, work with hepatitis A-infected animals, work in hepatitis A research labs, or travel to or work in countries with a high rate of hepatitis A should be immunized. Adults who were previously unvaccinated  and who anticipate close contact with an international adoptee during the first 60 days after arrival in the [place] from a country with a high rate of hepatitis A should be immunized.  Hepatitis B vaccine. Adults who wish to be protected from this disease, have certain high-risk conditions, may be exposed to blood or other infectious body fluids, are household contacts or sex partners of hepatitis B positive people, are clients or workers in certain care facilities, or travel to or work in countries with a high rate of hepatitis B should be immunized.  Haemophilus influenzae type b (Hib) vaccine. A previously unvaccinated person with asplenia or sickle cell disease or having a scheduled splenectomy should receive 1 dose of Hib vaccine. Regardless of previous immunization, a recipient of a hematopoietic stem cell transplant should receive a 3-dose series 6-12 months after her successful transplant. Hib vaccine is not recommended for adults with HIV infection. Preventive Services / Frequency Ages 48 to 22 years  Blood pressure check.** / Every 3-5 years.  Lipid and cholesterol check.** / Every 5 years beginning at age 59.  Clinical breast exam.** / Every 3 years for women in their 38s and 50s.  BRCA-related cancer risk assessment.** / For women who have family members with a BRCA-related cancer (breast, ovarian, tubal, or peritoneal cancers).  Pap test.** / Every 2 years from ages 52 through 52. Every 3 years starting at age 75 through age 62 or 64 with a history of 3 consecutive normal Pap tests.  HPV screening.** / Every 3 years from ages 43 through ages 52 to 41 with a history of 3 consecutive normal Pap tests.  Hepatitis  C blood test.** / For any individual with known risks for hepatitis C.  Skin self-exam. / Monthly.  Influenza vaccine. / Every year.  Tetanus, diphtheria, and acellular pertussis (Tdap, Td) vaccine.** / Consult your health care provider. Pregnant women should receive 1 dose of Tdap vaccine during each pregnancy. 1 dose of Td every 10 years.  Varicella vaccine.** / Consult your health care provider. Pregnant females who do not have evidence of immunity should receive the first dose after pregnancy.  HPV vaccine. / 3 doses over 6 months, if 63 and younger. The vaccine is not recommended for use in pregnant females. However, pregnancy testing is not needed before receiving a dose.  Measles, mumps, rubella (MMR) vaccine.** / You need at least 1 dose of MMR if you were born in 1956 or later. You may also need a 2nd dose. For females of childbearing age, rubella immunity should be determined. If there is no evidence of immunity, females who are not pregnant should be vaccinated. If there is no evidence of immunity, females who are pregnant should delay immunization until after pregnancy.  Pneumococcal 13-valent conjugate (PCV13) vaccine.** / Consult your health care provider.  Pneumococcal polysaccharide (PPSV23) vaccine.** / 1 to 2 doses if you smoke cigarettes or if you have certain conditions.  Meningococcal vaccine.** / 1 dose if you are age 91 to 77 years and a Market researcher living in a residence hall, or have one of several medical conditions, you need to get vaccinated against meningococcal disease. You may also need additional booster doses.  Hepatitis A vaccine.** / Consult your health care provider.  Hepatitis B vaccine.** / Consult your health care provider.  Haemophilus influenzae type b (Hib) vaccine.** / Consult your health care provider. Ages 73 to 42 years  Blood pressure check.** / Every year.  Lipid and cholesterol  check.** / Every 5 years beginning at age 65  years.  Lung cancer screening. / Every year if you are aged 66-80 years and have a 30-pack-year history of smoking and currently smoke or have quit within the past 15 years. Yearly screening is stopped once you have quit smoking for at least 15 years or develop a health problem that would prevent you from having lung cancer treatment.  Clinical breast exam.** / Every year after age 75 years.  BRCA-related cancer risk assessment.** / For women who have family members with a BRCA-related cancer (breast, ovarian, tubal, or peritoneal cancers).  Mammogram.** / Every year beginning at age 71 years and continuing for as long as you are in good health. Consult with your health care provider.  Pap test.** / Every 3 years starting at age 66 years through age 27 or 72 years with a history of 3 consecutive normal Pap tests.  HPV screening.** / Every 3 years from ages 68 years through ages 56 to 10 years with a history of 3 consecutive normal Pap tests.  Fecal occult blood test (FOBT) of stool. / Every year beginning at age 71 years and continuing until age 27 years. You may not need to do this test if you get a colonoscopy every 10 years.  Flexible sigmoidoscopy or colonoscopy.** / Every 5 years for a flexible sigmoidoscopy or every 10 years for a colonoscopy beginning at age 47 years and continuing until age 82 years.  Hepatitis C blood test.** / For all people born from [birth year] through 1964 and any individual with known risks for hepatitis C.  Skin self-exam. / Monthly.  Influenza vaccine. / Every year.  Tetanus, diphtheria, and acellular pertussis (Tdap/Td) vaccine.** / Consult your health care provider. Pregnant women should receive 1 dose of Tdap vaccine during each pregnancy. 1 dose of Td every 10 years.  Varicella vaccine.** / Consult your health care provider. Pregnant females who do not have evidence of immunity should receive the first dose after pregnancy.  Zoster vaccine.** / 1 dose for  adults aged 77 years or older.  Measles, mumps, rubella (MMR) vaccine.** / You need at least 1 dose of MMR if you were born in 1957 or later. You may also need a second dose. For females of childbearing age, rubella immunity should be determined. If there is no evidence of immunity, females who are not pregnant should be vaccinated. If there is no evidence of immunity, females who are pregnant should delay immunization until after pregnancy.  Pneumococcal 13-valent conjugate (PCV13) vaccine.** / Consult your health care provider.  Pneumococcal polysaccharide (PPSV23) vaccine.** / 1 to 2 doses if you smoke cigarettes or if you have certain conditions.  Meningococcal vaccine.** / Consult your health care provider.  Hepatitis A vaccine.** / Consult your health care provider.  Hepatitis B vaccine.** / Consult your health care provider.  Haemophilus influenzae type b (Hib) vaccine.** / Consult your health care provider. Ages 63 years and over  Blood pressure check.** / Every year.  Lipid and cholesterol check.** / Every 5 years beginning at age 89 years.  Lung cancer screening. / Every year if you are aged 48-80 years and have a 30-pack-year history of smoking and currently smoke or have quit within the past 15 years. Yearly screening is stopped once you have quit smoking for at least 15 years or develop a health problem that would prevent you from having lung cancer treatment.  Clinical breast exam.** / Every year after age 55 years.  BRCA-related cancer risk assessment.** / For women who have family members with a BRCA-related cancer (breast, ovarian, tubal, or peritoneal cancers).  Mammogram.** / Every year beginning at age 43 years and continuing for as long as you are in good health. Consult with your health care provider.  Pap test.** / Every 3 years starting at age 21 years through age 69 or 58 years with 3 consecutive normal Pap tests. Testing can be stopped between 65 and 70 years  with 3 consecutive normal Pap tests and no abnormal Pap or HPV tests in the past 10 years.  HPV screening.** / Every 3 years from ages 47 years through ages 37 or 28 years with a history of 3 consecutive normal Pap tests. Testing can be stopped between 65 and 70 years with 3 consecutive normal Pap tests and no abnormal Pap or HPV tests in the past 10 years.  Fecal occult blood test (FOBT) of stool. / Every year beginning at age 70 years and continuing until age 66 years. You may not need to do this test if you get a colonoscopy every 10 years.  Flexible sigmoidoscopy or colonoscopy.** / Every 5 years for a flexible sigmoidoscopy or every 10 years for a colonoscopy beginning at age 108 years and continuing until age 68 years.  Hepatitis C blood test.** / For all people born from [birth year] through 1964 and any individual with known risks for hepatitis C.  Osteoporosis screening.** / A one-time screening for women ages 37 years and over and women at risk for fractures or osteoporosis.  Skin self-exam. / Monthly.  Influenza vaccine. / Every year.  Tetanus, diphtheria, and acellular pertussis (Tdap/Td) vaccine.** / 1 dose of Td every 10 years.  Varicella vaccine.** / Consult your health care provider.  Zoster vaccine.** / 1 dose for adults aged 68 years or older.  Pneumococcal 13-valent conjugate (PCV13) vaccine.** / Consult your health care provider.  Pneumococcal polysaccharide (PPSV23) vaccine.** / 1 dose for all adults aged 69 years and older.  Meningococcal vaccine.** / Consult your health care provider.  Hepatitis A vaccine.** / Consult your health care provider.  Hepatitis B vaccine.** / Consult your health care provider.  Haemophilus influenzae type b (Hib) vaccine.** / Consult your health care provider. ** Family history and personal history of risk and conditions may change your health care provider's recommendations.   This information is not intended to replace advice given to you  by your health care provider. Make sure you discuss any questions you have with your health care provider.   Document Released: 04/30/2001 Document Revised: 03/25/2014 Document Reviewed: 07/30/2010 Elsevier Interactive Patient Education [employer].

## 2015-09-15 ENCOUNTER — Other Ambulatory Visit: Payer: Self-pay | Admitting: Family Medicine

## 2015-10-17 ENCOUNTER — Ambulatory Visit (INDEPENDENT_AMBULATORY_CARE_PROVIDER_SITE_OTHER): Payer: BLUE CROSS/BLUE SHIELD | Admitting: Family Medicine

## 2015-10-17 ENCOUNTER — Encounter: Payer: Self-pay | Admitting: Family Medicine

## 2015-10-17 VITALS — BP 124/89 | Resp 18 | Ht 63.0 in | Wt 137.0 lb

## 2015-10-17 DIAGNOSIS — K602 Anal fissure, unspecified: Secondary | ICD-10-CM | POA: Diagnosis not present

## 2015-10-17 DIAGNOSIS — K640 First degree hemorrhoids: Secondary | ICD-10-CM

## 2015-10-17 MED ORDER — LIDOCAINE VISCOUS 2 % MT SOLN
20.0000 mL | OROMUCOSAL | 0 refills | Status: DC | PRN
Start: 2015-10-17 — End: 2015-11-23

## 2015-10-17 MED ORDER — HYDROCORTISONE 2.5 % RE CREA: 1.0000 "application " | TOPICAL_CREAM | Freq: Two times a day (BID) | RECTAL | 0 refills | Status: DC

## 2015-10-17 MED ORDER — SENNOSIDES-DOCUSATE SODIUM 8.6-50 MG PO TABS: 1.0000 | ORAL_TABLET | Freq: Every day | ORAL | 1 refills | Status: DC

## 2015-10-17 MED ORDER — CALCIUM POLYCARBOPHIL 625 MG PO TABS: 625.0000 mg | ORAL_TABLET | Freq: Every day | ORAL | 3 refills | Status: DC

## 2015-10-17 NOTE — Patient Instructions (Signed)

## 2015-10-17 NOTE — Progress Notes (Signed)
   Subjective:    Patient ID: Stephanie [name] is a 35 y.o. female presenting with Rectal Pain  on 10/17/2015  HPI: Had hemorrhoid 6/15. Had hard stool 5 days ago. Noted blood and still having severe pain with stooling.  Review of Systems  Constitutional: Negative for chills and fever.  Respiratory: Negative for shortness of breath.   Cardiovascular: Negative for chest pain.  Gastrointestinal: Negative for abdominal pain, nausea and vomiting.  Genitourinary: Negative for dysuria.  Skin: Negative for rash.      Objective:    BP 124/89 (BP Location: Left Arm, Patient Position: Sitting, Cuff Size: Normal)   Resp 18   Ht 5\' 3"  (1.6 m)   Wt 137 lb (62.1 kg)   LMP 10/15/2015   BMI 24.27 kg/m  Physical Exam  Constitutional: She is oriented to person, place, and time. She appears well-developed and well-nourished. No distress.  HENT:  Head: Normocephalic and atraumatic.  Eyes: No scleral icterus.  Neck: Neck supple.  Cardiovascular: Normal rate.   Pulmonary/Chest: Effort normal.  Abdominal: Soft.  Genitourinary:     Neurological: She is alert and oriented to person, place, and time.  Skin: Skin is warm and dry.  Psychiatric: She has a normal mood and affect.        Assessment & Plan:   Problem List Items Addressed This Visit    None    Visit Diagnoses    Anal fissure    -  Primary   sitz baths, stool softener and bulking agent.   Relevant Medications   senna-docusate (SENOKOT-S) 8.6-50 MG tablet   polycarbophil (FIBERCON) 625 MG tablet   lidocaine (XYLOCAINE) 2 % solution   First degree hemorrhoids       trial of topical treatments   Relevant Medications   hydrocortisone (ANUSOL-HC) 2.5 % rectal cream      Return in about 4 months (around 02/16/2016).  Paden Kuras S 10/17/2015 1:48 PM

## 2015-11-23 ENCOUNTER — Ambulatory Visit (INDEPENDENT_AMBULATORY_CARE_PROVIDER_SITE_OTHER): Payer: BLUE CROSS/BLUE SHIELD | Admitting: Family Medicine

## 2015-11-23 ENCOUNTER — Encounter: Payer: Self-pay | Admitting: Family Medicine

## 2015-11-23 VITALS — BP 126/86 | HR 96 | Ht 62.0 in | Wt 138.0 lb

## 2015-11-23 DIAGNOSIS — Z124 Encounter for screening for malignant neoplasm of cervix: Secondary | ICD-10-CM

## 2015-11-23 DIAGNOSIS — R87610 Atypical squamous cells of undetermined significance on cytologic smear of cervix (ASC-US): Secondary | ICD-10-CM | POA: Diagnosis not present

## 2015-11-23 DIAGNOSIS — Z3041 Encounter for surveillance of contraceptive pills: Secondary | ICD-10-CM | POA: Diagnosis not present

## 2015-11-23 DIAGNOSIS — Z1151 Encounter for screening for human papillomavirus (HPV): Secondary | ICD-10-CM | POA: Diagnosis not present

## 2015-11-23 DIAGNOSIS — N898 Other specified noninflammatory disorders of vagina: Secondary | ICD-10-CM | POA: Diagnosis not present

## 2015-11-23 DIAGNOSIS — Z01419 Encounter for gynecological examination (general) (routine) without abnormal findings: Secondary | ICD-10-CM | POA: Diagnosis not present

## 2015-11-23 LAB — CBC
HCT: 41.9 % (ref 35.0–45.0)
Hemoglobin: 14.3 g/dL (ref 11.7–15.5)
MCH: 29.4 pg (ref 27.0–33.0)
MCHC: 34.1 g/dL (ref 32.0–36.0)
MCV: 86 fL (ref 80.0–100.0)
MPV: 11.1 fL (ref 7.5–12.5)
Platelets: 171 10*3/uL (ref 140–400)
RBC: 4.87 MIL/uL (ref 3.80–5.10)
RDW: 13 % (ref 11.0–15.0)
WBC: 3.4 10*3/uL — ABNORMAL LOW (ref 3.8–10.8)

## 2015-11-23 MED ORDER — NORGESTIMATE-ETH ESTRADIOL 0.25-35 MG-MCG PO TABS: 1.0000 | ORAL_TABLET | Freq: Every day | ORAL | 16 refills | Status: DC

## 2015-11-23 NOTE — Patient Instructions (Signed)
Preventive Care for Adults, Female A healthy lifestyle and preventive care can promote health and wellness. Preventive health guidelines for women include the following key practices.  A routine yearly physical is a good way to check with your health care provider about your health and preventive screening. It is a chance to share any concerns and updates on your health and to receive a thorough exam.  Visit your dentist for a routine exam and preventive care every 6 months. Brush your teeth twice a day and floss once a day. Good oral hygiene prevents tooth decay and gum disease.  The frequency of eye exams is based on your age, health, family medical history, use of contact lenses, and other factors. Follow your health care provider's recommendations for frequency of eye exams.  Eat a healthy diet. Foods like vegetables, fruits, whole grains, low-fat dairy products, and lean protein foods contain the nutrients you need without too many calories. Decrease your intake of foods high in solid fats, added sugars, and salt. Eat the right amount of calories for you.Get information about a proper diet from your health care provider, if necessary.  Regular physical exercise is one of the most important things you can do for your health. Most adults should get at least 150 minutes of moderate-intensity exercise (any activity that increases your heart rate and causes you to sweat) each week. In addition, most adults need muscle-strengthening exercises on 2 or more days a week.  Maintain a healthy weight. The body mass index (BMI) is a screening tool to identify possible weight problems. It provides an estimate of body fat based on height and weight. Your health care provider can find your BMI and can help you achieve or maintain a healthy weight.For adults 20 years and older:  A BMI below 18.5 is considered underweight.  A BMI of 18.5 to 24.9 is normal.  A BMI of 25 to 29.9 is considered overweight.  A  BMI of 30 and above is considered obese.  Maintain normal blood lipids and cholesterol levels by exercising and minimizing your intake of saturated fat. Eat a balanced diet with plenty of fruit and vegetables. Blood tests for lipids and cholesterol should begin at age 45 and be repeated every 5 years. If your lipid or cholesterol levels are high, you are over 50, or you are at high risk for heart disease, you may need your cholesterol levels checked more frequently.Ongoing high lipid and cholesterol levels should be treated with medicines if diet and exercise are not working.  If you smoke, find out from your health care provider how to quit. If you do not use tobacco, do not start.  Lung cancer screening is recommended for adults aged 45-80 years who are at high risk for developing lung cancer because of a history of smoking. A yearly low-dose CT scan of the lungs is recommended for people who have at least a 30-pack-year history of smoking and are a current smoker or have quit within the past 15 years. A pack year of smoking is smoking an average of 1 pack of cigarettes a day for 1 year (for example: 1 pack a day for 30 years or 2 packs a day for 15 years). Yearly screening should continue until the smoker has stopped smoking for at least 15 years. Yearly screening should be stopped for people who develop a health problem that would prevent them from having lung cancer treatment.  If you are pregnant, do not drink alcohol. If you are  breastfeeding, be very cautious about drinking alcohol. If you are not pregnant and choose to drink alcohol, do not have more than 1 drink per day. One drink is considered to be 12 ounces (355 mL) of beer, 5 ounces (148 mL) of wine, or 1.5 ounces (44 mL) of liquor.  Avoid use of street drugs. Do not share needles with anyone. Ask for help if you need support or instructions about stopping the use of drugs.  High blood pressure causes heart disease and increases the risk  of stroke. Your blood pressure should be checked at least every 1 to 2 years. Ongoing high blood pressure should be treated with medicines if weight loss and exercise do not work.  If you are 55-79 years old, ask your health care provider if you should take aspirin to prevent strokes.  Diabetes screening is done by taking a blood sample to check your blood glucose level after you have not eaten for a certain period of time (fasting). If you are not overweight and you do not have risk factors for diabetes, you should be screened once every 3 years starting at age 45. If you are overweight or obese and you are 40-70 years of age, you should be screened for diabetes every year as part of your cardiovascular risk assessment.  Breast cancer screening is essential preventive care for women. You should practice "breast self-awareness." This means understanding the normal appearance and feel of your breasts and may include breast self-examination. Any changes detected, no matter how small, should be reported to a health care provider. Women in their 20s and 30s should have a clinical breast exam (CBE) by a health care provider as part of a regular health exam every 1 to 3 years. After age 40, women should have a CBE every year. Starting at age 40, women should consider having a mammogram (breast X-ray test) every year. Women who have a family history of breast cancer should talk to their health care provider about genetic screening. Women at a high risk of breast cancer should talk to their health care providers about having an MRI and a mammogram every year.  Breast cancer gene (BRCA)-related cancer risk assessment is recommended for women who have family members with BRCA-related cancers. BRCA-related cancers include breast, ovarian, tubal, and peritoneal cancers. Having family members with these cancers may be associated with an increased risk for harmful changes (mutations) in the breast cancer genes BRCA1 and  BRCA2. Results of the assessment will determine the need for genetic counseling and BRCA1 and BRCA2 testing.  Your health care provider may recommend that you be screened regularly for cancer of the pelvic organs (ovaries, uterus, and vagina). This screening involves a pelvic examination, including checking for microscopic changes to the surface of your cervix (Pap test). You may be encouraged to have this screening done every 3 years, beginning at age 21.  For women ages 30-65, health care providers may recommend pelvic exams and Pap testing every 3 years, or they may recommend the Pap and pelvic exam, combined with testing for human papilloma virus (HPV), every 5 years. Some types of HPV increase your risk of cervical cancer. Testing for HPV may also be done on women of any age with unclear Pap test results.  Other health care providers may not recommend any screening for nonpregnant women who are considered low risk for pelvic cancer and who do not have symptoms. Ask your health care provider if a screening pelvic exam is right for   you.  If you have had past treatment for cervical cancer or a condition that could lead to cancer, you need Pap tests and screening for cancer for at least 20 years after your treatment. If Pap tests have been discontinued, your risk factors (such as having a new sexual partner) need to be reassessed to determine if screening should resume. Some women have medical problems that increase the chance of getting cervical cancer. In these cases, your health care provider may recommend more frequent screening and Pap tests.  Colorectal cancer can be detected and often prevented. Most routine colorectal cancer screening begins at the age of 50 years and continues through age 75 years. However, your health care provider may recommend screening at an earlier age if you have risk factors for colon cancer. On a yearly basis, your health care provider may provide home test kits to check  for hidden blood in the stool. Use of a small camera at the end of a tube, to directly examine the colon (sigmoidoscopy or colonoscopy), can detect the earliest forms of colorectal cancer. Talk to your health care provider about this at age 50, when routine screening begins. Direct exam of the colon should be repeated every 5-10 years through age 75 years, unless early forms of precancerous polyps or small growths are found.  People who are at an increased risk for hepatitis B should be screened for this virus. You are considered at high risk for hepatitis B if:  You were born in a country where hepatitis B occurs often. Talk with your health care provider about which countries are considered high risk.  Your parents were born in a high-risk country and you have not received a shot to protect against hepatitis B (hepatitis B vaccine).  You have HIV or AIDS.  You use needles to inject street drugs.  You live with, or have sex with, someone who has hepatitis B.  You get hemodialysis treatment.  You take certain medicines for conditions like cancer, organ transplantation, and autoimmune conditions.  Hepatitis C blood testing is recommended for all people born from 1945 through 1965 and any individual with known risks for hepatitis C.  Practice safe sex. Use condoms and avoid high-risk sexual practices to reduce the spread of sexually transmitted infections (STIs). STIs include gonorrhea, chlamydia, syphilis, trichomonas, herpes, HPV, and human immunodeficiency virus (HIV). Herpes, HIV, and HPV are viral illnesses that have no cure. They can result in disability, cancer, and death.  You should be screened for sexually transmitted illnesses (STIs) including gonorrhea and chlamydia if:  You are sexually active and are younger than 24 years.  You are older than 24 years and your health care provider tells you that you are at risk for this type of infection.  Your sexual activity has changed  since you were last screened and you are at an increased risk for chlamydia or gonorrhea. Ask your health care provider if you are at risk.  If you are at risk of being infected with HIV, it is recommended that you take a prescription medicine daily to prevent HIV infection. This is called preexposure prophylaxis (PrEP). You are considered at risk if:  You are sexually active and do not regularly use condoms or know the HIV status of your partner(s).  You take drugs by injection.  You are sexually active with a partner who has HIV.  Talk with your health care provider about whether you are at high risk of being infected with HIV. If   you choose to begin PrEP, you should first be tested for HIV. You should then be tested every 3 months for as long as you are taking PrEP.  Osteoporosis is a disease in which the bones lose minerals and strength with aging. This can result in serious bone fractures or breaks. The risk of osteoporosis can be identified using a bone density scan. Women ages 67 years and over and women at risk for fractures or osteoporosis should discuss screening with their health care providers. Ask your health care provider whether you should take a calcium supplement or vitamin D to reduce the rate of osteoporosis.  Menopause can be associated with physical symptoms and risks. Hormone replacement therapy is available to decrease symptoms and risks. You should talk to your health care provider about whether hormone replacement therapy is right for you.  Use sunscreen. Apply sunscreen liberally and repeatedly throughout the day. You should seek shade when your shadow is shorter than you. Protect yourself by wearing long sleeves, pants, a wide-brimmed hat, and sunglasses year round, whenever you are outdoors.  Once a month, do a whole body skin exam, using a mirror to look at the skin on your back. Tell your health care provider of new moles, moles that have irregular borders, moles that  are larger than a pencil eraser, or moles that have changed in shape or color.  Stay current with required vaccines (immunizations).  Influenza vaccine. All adults should be immunized every year.  Tetanus, diphtheria, and acellular pertussis (Td, Tdap) vaccine. Pregnant women should receive 1 dose of Tdap vaccine during each pregnancy. The dose should be obtained regardless of the length of time since the last dose. Immunization is preferred during the 27th-36th week of gestation. An adult who has not previously received Tdap or who does not know her vaccine status should receive 1 dose of Tdap. This initial dose should be followed by tetanus and diphtheria toxoids (Td) booster doses every 10 years. Adults with an unknown or incomplete history of completing a 3-dose immunization series with Td-containing vaccines should begin or complete a primary immunization series including a Tdap dose. Adults should receive a Td booster every 10 years.  Varicella vaccine. An adult without evidence of immunity to varicella should receive 2 doses or a second dose if she has previously received 1 dose. Pregnant females who do not have evidence of immunity should receive the first dose after pregnancy. This first dose should be obtained before leaving the health care facility. The second dose should be obtained 4-8 weeks after the first dose.  Human papillomavirus (HPV) vaccine. Females aged 13-26 years who have not received the vaccine previously should obtain the 3-dose series. The vaccine is not recommended for use in pregnant females. However, pregnancy testing is not needed before receiving a dose. If a female is found to be pregnant after receiving a dose, no treatment is needed. In that case, the remaining doses should be delayed until after the pregnancy. Immunization is recommended for any person with an immunocompromised condition through the age of 61 years if she did not get any or all doses earlier. During the  3-dose series, the second dose should be obtained 4-8 weeks after the first dose. The third dose should be obtained 24 weeks after the first dose and 16 weeks after the second dose.  Zoster vaccine. One dose is recommended for adults aged 30 years or older unless certain conditions are present.  Measles, mumps, and rubella (MMR) vaccine. Adults born  before 1957 generally are considered immune to measles and mumps. Adults born in 1957 or later should have 1 or more doses of MMR vaccine unless there is a contraindication to the vaccine or there is laboratory evidence of immunity to each of the three diseases. A routine second dose of MMR vaccine should be obtained at least 28 days after the first dose for students attending postsecondary schools, health care workers, or international travelers. People who received inactivated measles vaccine or an unknown type of measles vaccine during 1963-1967 should receive 2 doses of MMR vaccine. People who received inactivated mumps vaccine or an unknown type of mumps vaccine before 1979 and are at high risk for mumps infection should consider immunization with 2 doses of MMR vaccine. For females of childbearing age, rubella immunity should be determined. If there is no evidence of immunity, females who are not pregnant should be vaccinated. If there is no evidence of immunity, females who are pregnant should delay immunization until after pregnancy. Unvaccinated health care workers born before 1957 who lack laboratory evidence of measles, mumps, or rubella immunity or laboratory confirmation of disease should consider measles and mumps immunization with 2 doses of MMR vaccine or rubella immunization with 1 dose of MMR vaccine.  Pneumococcal 13-valent conjugate (PCV13) vaccine. When indicated, a person who is uncertain of his immunization history and has no record of immunization should receive the PCV13 vaccine. All adults 65 years of age and older should receive this  vaccine. An adult aged 19 years or older who has certain medical conditions and has not been previously immunized should receive 1 dose of PCV13 vaccine. This PCV13 should be followed with a dose of pneumococcal polysaccharide (PPSV23) vaccine. Adults who are at high risk for pneumococcal disease should obtain the PPSV23 vaccine at least 8 weeks after the dose of PCV13 vaccine. Adults older than 34 years of age who have normal immune system function should obtain the PPSV23 vaccine dose at least 1 year after the dose of PCV13 vaccine.  Pneumococcal polysaccharide (PPSV23) vaccine. When PCV13 is also indicated, PCV13 should be obtained first. All adults aged 65 years and older should be immunized. An adult younger than age 65 years who has certain medical conditions should be immunized. Any person who resides in a nursing home or long-term care facility should be immunized. An adult smoker should be immunized. People with an immunocompromised condition and certain other conditions should receive both PCV13 and PPSV23 vaccines. People with human immunodeficiency virus (HIV) infection should be immunized as soon as possible after diagnosis. Immunization during chemotherapy or radiation therapy should be avoided. Routine use of PPSV23 vaccine is not recommended for American Indians, [place] Natives, or people younger than 65 years unless there are medical conditions that require PPSV23 vaccine. When indicated, people who have unknown immunization and have no record of immunization should receive PPSV23 vaccine. One-time revaccination 5 years after the first dose of PPSV23 is recommended for people aged 19-64 years who have chronic kidney failure, nephrotic syndrome, asplenia, or immunocompromised conditions. People who received 1-2 doses of PPSV23 before age 65 years should receive another dose of PPSV23 vaccine at age 65 years or later if at least 5 years have passed since the previous dose. Doses of PPSV23 are not  needed for people immunized with PPSV23 at or after age 65 years.  Meningococcal vaccine. Adults with asplenia or persistent complement component deficiencies should receive 2 doses of quadrivalent meningococcal conjugate (MenACWY-D) vaccine. The doses should be obtained   at least 2 months apart. Microbiologists working with certain meningococcal bacteria, [place] recruits, people at risk during an outbreak, and people who travel to or live in countries with a high rate of meningitis should be immunized. A first-year college student up through age 34 years who is living in a residence hall should receive a dose if she did not receive a dose on or after her 16th birthday. Adults who have certain high-risk conditions should receive one or more doses of vaccine.  Hepatitis A vaccine. Adults who wish to be protected from this disease, have certain high-risk conditions, work with hepatitis A-infected animals, work in hepatitis A research labs, or travel to or work in countries with a high rate of hepatitis A should be immunized. Adults who were previously unvaccinated and who anticipate close contact with an international adoptee during the first 60 days after arrival in the [place] from a country with a high rate of hepatitis A should be immunized.  Hepatitis B vaccine. Adults who wish to be protected from this disease, have certain high-risk conditions, may be exposed to blood or other infectious body fluids, are household contacts or sex partners of hepatitis B positive people, are clients or workers in certain care facilities, or travel to or work in countries with a high rate of hepatitis B should be immunized.  Haemophilus influenzae type b (Hib) vaccine. A previously unvaccinated person with asplenia or sickle cell disease or having a scheduled splenectomy should receive 1 dose of Hib vaccine. Regardless of previous immunization, a recipient of a hematopoietic stem cell transplant should receive a  3-dose series 6-12 months after her successful transplant. Hib vaccine is not recommended for adults with HIV infection. Preventive Services / Frequency Ages 35 to 4 years  Blood pressure check.** / Every 3-5 years.  Lipid and cholesterol check.** / Every 5 years beginning at age 60.  Clinical breast exam.** / Every 3 years for women in their 71s and 10s.  BRCA-related cancer risk assessment.** / For women who have family members with a BRCA-related cancer (breast, ovarian, tubal, or peritoneal cancers).  Pap test.** / Every 2 years from ages 76 through 26. Every 3 years starting at age 61 through age 76 or 93 with a history of 3 consecutive normal Pap tests.  HPV screening.** / Every 3 years from ages 37 through ages 60 to 51 with a history of 3 consecutive normal Pap tests.  Hepatitis C blood test.** / For any individual with known risks for hepatitis C.  Skin self-exam. / Monthly.  Influenza vaccine. / Every year.  Tetanus, diphtheria, and acellular pertussis (Tdap, Td) vaccine.** / Consult your health care provider. Pregnant women should receive 1 dose of Tdap vaccine during each pregnancy. 1 dose of Td every 10 years.  Varicella vaccine.** / Consult your health care provider. Pregnant females who do not have evidence of immunity should receive the first dose after pregnancy.  HPV vaccine. / 3 doses over 6 months, if 93 and younger. The vaccine is not recommended for use in pregnant females. However, pregnancy testing is not needed before receiving a dose.  Measles, mumps, rubella (MMR) vaccine.** / You need at least 1 dose of MMR if you were born in 1957 or later. You may also need a 2nd dose. For females of childbearing age, rubella immunity should be determined. If there is no evidence of immunity, females who are not pregnant should be vaccinated. If there is no evidence of immunity, females who are  pregnant should delay immunization until after pregnancy.  Pneumococcal  13-valent conjugate (PCV13) vaccine.** / Consult your health care provider.  Pneumococcal polysaccharide (PPSV23) vaccine.** / 1 to 2 doses if you smoke cigarettes or if you have certain conditions.  Meningococcal vaccine.** / 1 dose if you are age 68 to 8 years and a Market researcher living in a residence hall, or have one of several medical conditions, you need to get vaccinated against meningococcal disease. You may also need additional booster doses.  Hepatitis A vaccine.** / Consult your health care provider.  Hepatitis B vaccine.** / Consult your health care provider.  Haemophilus influenzae type b (Hib) vaccine.** / Consult your health care provider. Ages 7 to 53 years  Blood pressure check.** / Every year.  Lipid and cholesterol check.** / Every 5 years beginning at age 25 years.  Lung cancer screening. / Every year if you are aged 11-80 years and have a 30-pack-year history of smoking and currently smoke or have quit within the past 15 years. Yearly screening is stopped once you have quit smoking for at least 15 years or develop a health problem that would prevent you from having lung cancer treatment.  Clinical breast exam.** / Every year after age 48 years.  BRCA-related cancer risk assessment.** / For women who have family members with a BRCA-related cancer (breast, ovarian, tubal, or peritoneal cancers).  Mammogram.** / Every year beginning at age 41 years and continuing for as long as you are in good health. Consult with your health care provider.  Pap test.** / Every 3 years starting at age 65 years through age 37 or 70 years with a history of 3 consecutive normal Pap tests.  HPV screening.** / Every 3 years from ages 72 years through ages 60 to 40 years with a history of 3 consecutive normal Pap tests.  Fecal occult blood test (FOBT) of stool. / Every year beginning at age 21 years and continuing until age 5 years. You may not need to do this test if you get  a colonoscopy every 10 years.  Flexible sigmoidoscopy or colonoscopy.** / Every 5 years for a flexible sigmoidoscopy or every 10 years for a colonoscopy beginning at age 35 years and continuing until age 48 years.  Hepatitis C blood test.** / For all people born from [birth year] through 1965 and any individual with known risks for hepatitis C.  Skin self-exam. / Monthly.  Influenza vaccine. / Every year.  Tetanus, diphtheria, and acellular pertussis (Tdap/Td) vaccine.** / Consult your health care provider. Pregnant women should receive 1 dose of Tdap vaccine during each pregnancy. 1 dose of Td every 10 years.  Varicella vaccine.** / Consult your health care provider. Pregnant females who do not have evidence of immunity should receive the first dose after pregnancy.  Zoster vaccine.** / 1 dose for adults aged 30 years or older.  Measles, mumps, rubella (MMR) vaccine.** / You need at least 1 dose of MMR if you were born in 1957 or later. You may also need a second dose. For females of childbearing age, rubella immunity should be determined. If there is no evidence of immunity, females who are not pregnant should be vaccinated. If there is no evidence of immunity, females who are pregnant should delay immunization until after pregnancy.  Pneumococcal 13-valent conjugate (PCV13) vaccine.** / Consult your health care provider.  Pneumococcal polysaccharide (PPSV23) vaccine.** / 1 to 2 doses if you smoke cigarettes or if you have certain conditions.  Meningococcal vaccine.** /  Consult your health care provider.  Hepatitis A vaccine.** / Consult your health care provider.  Hepatitis B vaccine.** / Consult your health care provider.  Haemophilus influenzae type b (Hib) vaccine.** / Consult your health care provider. Ages 64 years and over  Blood pressure check.** / Every year.  Lipid and cholesterol check.** / Every 5 years beginning at age 23 years.  Lung cancer screening. / Every year if you  are aged 16-80 years and have a 30-pack-year history of smoking and currently smoke or have quit within the past 15 years. Yearly screening is stopped once you have quit smoking for at least 15 years or develop a health problem that would prevent you from having lung cancer treatment.  Clinical breast exam.** / Every year after age 74 years.  BRCA-related cancer risk assessment.** / For women who have family members with a BRCA-related cancer (breast, ovarian, tubal, or peritoneal cancers).  Mammogram.** / Every year beginning at age 44 years and continuing for as long as you are in good health. Consult with your health care provider.  Pap test.** / Every 3 years starting at age 58 years through age 22 or 39 years with 3 consecutive normal Pap tests. Testing can be stopped between 65 and 70 years with 3 consecutive normal Pap tests and no abnormal Pap or HPV tests in the past 10 years.  HPV screening.** / Every 3 years from ages 64 years through ages 70 or 61 years with a history of 3 consecutive normal Pap tests. Testing can be stopped between 65 and 70 years with 3 consecutive normal Pap tests and no abnormal Pap or HPV tests in the past 10 years.  Fecal occult blood test (FOBT) of stool. / Every year beginning at age 40 years and continuing until age 27 years. You may not need to do this test if you get a colonoscopy every 10 years.  Flexible sigmoidoscopy or colonoscopy.** / Every 5 years for a flexible sigmoidoscopy or every 10 years for a colonoscopy beginning at age 7 years and continuing until age 32 years.  Hepatitis C blood test.** / For all people born from [birth year] through 1964 and any individual with known risks for hepatitis C.  Osteoporosis screening.** / A one-time screening for women ages 30 years and over and women at risk for fractures or osteoporosis.  Skin self-exam. / Monthly.  Influenza vaccine. / Every year.  Tetanus, diphtheria, and acellular pertussis (Tdap/Td)  vaccine.** / 1 dose of Td every 10 years.  Varicella vaccine.** / Consult your health care provider.  Zoster vaccine.** / 1 dose for adults aged 35 years or older.  Pneumococcal 13-valent conjugate (PCV13) vaccine.** / Consult your health care provider.  Pneumococcal polysaccharide (PPSV23) vaccine.** / 1 dose for all adults aged 46 years and older.  Meningococcal vaccine.** / Consult your health care provider.  Hepatitis A vaccine.** / Consult your health care provider.  Hepatitis B vaccine.** / Consult your health care provider.  Haemophilus influenzae type b (Hib) vaccine.** / Consult your health care provider. ** Family history and personal history of risk and conditions may change your health care provider's recommendations.   This information is not intended to replace advice given to you by your health care provider. Make sure you discuss any questions you have with your health care provider.   Document Released: 04/30/2001 Document Revised: 03/25/2014 Document Reviewed: 07/30/2010 Elsevier Interactive Patient Education [employer].

## 2015-11-23 NOTE — Progress Notes (Signed)
   Subjective:     Stephanie [name] is a 35 y.o. female and is here for a comprehensive physical exam. The patient reports problems - vaginal odor. Reports odor during sex. Switched soap and detergent and began vaginal probiotics. No vaginal discharge or irritation, not significant itch.  Social History   Social History  . Marital status: Single    Spouse name: N/A  . Number of children: N/A  . Years of education: N/A   Occupational History  . Not on file.   Social History Main Topics  . Smoking status: Never Smoker  . Smokeless tobacco: Never Used  . Alcohol use No  . Drug use: No  . Sexual activity: Yes    Partners: Male    Birth control/ protection: Pill   Other Topics Concern  . Not on file   Social History Narrative  . No narrative on file   Health Maintenance  Topic Date Due  . INFLUENZA VACCINE  04/11/2016 (Originally 10/17/2015)  . PAP SMEAR  02/15/2017  . TETANUS/TDAP  01/27/2023  . HIV Screening  Completed    The following portions of the patient's history were reviewed and updated as appropriate: allergies, current medications, past family history, past medical history, past social history, past surgical history and problem list.  Review of Systems Pertinent items are noted in HPI.   Objective:    BP 126/86   Pulse 96   Ht 5\' 2"  (1.575 m)   Wt 138 lb (62.6 kg)   LMP 11/18/2015 (Exact Date)   BMI 25.24 kg/m  General appearance: alert, cooperative and appears stated age Head: Normocephalic, without obvious abnormality, atraumatic Neck: no adenopathy, supple, symmetrical, trachea midline and thyroid not enlarged, symmetric, no tenderness/mass/nodules Lungs: normal percussion bilaterally Breasts: normal appearance, no masses or tenderness Heart: regular rate and rhythm, S1, S2 normal, no murmur, click, rub or gallop Abdomen: soft, non-tender; bowel sounds normal; no masses,  no organomegaly Pelvic: cervix normal in appearance, external  genitalia normal, no adnexal masses or tenderness, no cervical motion tenderness, uterus normal size, shape, and consistency and vagina normal without discharge Extremities: extremities normal, atraumatic, no cyanosis or edema Pulses: 2+ and symmetric Skin: Skin color, texture, turgor normal. No rashes or lesions Lymph nodes: Cervical, supraclavicular, and axillary nodes normal. Neurologic: Grossly normal    Assessment:    Healthy female exam.      Plan:      Problem List Items Addressed This Visit      Unprioritized   Surveillance of previously prescribed contraceptive pill   Relevant Medications   norgestimate-ethinyl estradiol (SPRINTEC 28) 0.25-35 MG-MCG tablet    Other Visit Diagnoses    Encounter for routine gynecological examination    -  Primary   Relevant Orders   TSH (Completed)   CBC (Completed)   Comprehensive metabolic panel (Completed)   Lipid panel (Completed)   Screening for malignant neoplasm of cervix       Relevant Orders   Cytology - PAP   Vaginal discharge       Relevant Orders   WET PREP BY MOLECULAR PROBE (Completed)      See After Visit Summary for Counseling Recommendations you have BV

## 2015-11-24 LAB — LIPID PANEL
Cholesterol: 172 mg/dL (ref 125–200)
HDL: 51 mg/dL (ref >=46)
LDL Cholesterol: 104 mg/dL (ref <130)
Total CHOL/HDL Ratio: 3.4 Ratio (ref <=5.0)
Triglycerides: 87 mg/dL (ref <150)
VLDL: 17 mg/dL (ref <30)

## 2015-11-24 LAB — COMPREHENSIVE METABOLIC PANEL
ALT: 12 U/L (ref 6–29)
AST: 17 U/L (ref 10–30)
Albumin: 4 g/dL (ref 3.6–5.1)
Alkaline Phosphatase: 47 U/L (ref 33–115)
BUN: 9 mg/dL (ref 7–25)
CO2: 27 mmol/L (ref 20–31)
Calcium: 8.9 mg/dL (ref 8.6–10.2)
Chloride: 106 mmol/L (ref 98–110)
Creat: 0.67 mg/dL (ref 0.50–1.10)
Glucose, Bld: 80 mg/dL (ref 65–99)
Potassium: 3.9 mmol/L (ref 3.5–5.3)
Sodium: 139 mmol/L (ref 135–146)
Total Bilirubin: 0.5 mg/dL (ref 0.2–1.2)
Total Protein: 6.9 g/dL (ref 6.1–8.1)

## 2015-11-24 LAB — WET PREP BY MOLECULAR PROBE
Candida species: POSITIVE — AB
Gardnerella vaginalis: POSITIVE — AB
Trichomonas vaginosis: NEGATIVE

## 2015-11-24 LAB — CYTOLOGY - PAP

## 2015-11-24 LAB — TSH: TSH: 4.18 mIU/L

## 2015-11-27 ENCOUNTER — Encounter: Payer: Self-pay | Admitting: Family Medicine

## 2015-11-27 ENCOUNTER — Telehealth: Payer: Self-pay | Admitting: *Deleted

## 2015-11-27 DIAGNOSIS — R87610 Atypical squamous cells of undetermined significance on cytologic smear of cervix (ASC-US): Secondary | ICD-10-CM | POA: Insufficient documentation

## 2015-11-27 DIAGNOSIS — N760 Acute vaginitis: Secondary | ICD-10-CM

## 2015-11-27 DIAGNOSIS — B9689 Other specified bacterial agents as the cause of diseases classified elsewhere: Secondary | ICD-10-CM

## 2015-11-27 DIAGNOSIS — B379 Candidiasis, unspecified: Secondary | ICD-10-CM

## 2015-11-27 MED ORDER — FLUCONAZOLE 150 MG PO TABS: 150.0000 mg | ORAL_TABLET | Freq: Once | ORAL | 0 refills | Status: AC

## 2015-11-27 MED ORDER — METRONIDAZOLE 500 MG PO TABS: 500.0000 mg | ORAL_TABLET | Freq: Two times a day (BID) | ORAL | 0 refills | Status: DC

## 2015-11-27 NOTE — Telephone Encounter (Signed)
Informed pt of wet prep and pap result.  Sent medication to pharmacy and instructed pt on medication use.  Pt acknowledged instructions.

## 2015-11-27 NOTE — Telephone Encounter (Signed)
-----   Message from Francia Greaves sent at 11/27/2015  1:17 PM EDT ----- Regarding: Lab Results/RX Request Contact: [phone] States that she thought Dr. Kennon Rounds was going to call her in a abx after her appt last Thursday, was just waiting on lab results.  Called to f/u with this. Wants to request a diflucan also if she is placed on abx  Uses [facility] on [address]

## 2015-12-04 ENCOUNTER — Telehealth: Payer: Self-pay | Admitting: *Deleted

## 2015-12-04 DIAGNOSIS — B3731 Acute candidiasis of vulva and vagina: Secondary | ICD-10-CM

## 2015-12-04 DIAGNOSIS — B373 Candidiasis of vulva and vagina: Secondary | ICD-10-CM

## 2015-12-04 MED ORDER — FLUCONAZOLE 150 MG PO TABS: 150.0000 mg | ORAL_TABLET | Freq: Once | ORAL | 0 refills | Status: AC

## 2015-12-04 NOTE — Telephone Encounter (Signed)
Pt called the office requesting a refill on Diflucan, states she is still experiencing some itching and irritation.  Sent Diflucan to pharmacy per protocol.

## 2016-02-21 DIAGNOSIS — H04123 Dry eye syndrome of bilateral lacrimal glands: Secondary | ICD-10-CM | POA: Diagnosis not present

## 2016-06-05 DIAGNOSIS — J019 Acute sinusitis, unspecified: Secondary | ICD-10-CM | POA: Diagnosis not present

## 2016-10-01 ENCOUNTER — Ambulatory Visit (INDEPENDENT_AMBULATORY_CARE_PROVIDER_SITE_OTHER): Payer: BLUE CROSS/BLUE SHIELD | Admitting: Obstetrics & Gynecology

## 2016-10-01 ENCOUNTER — Encounter: Payer: Self-pay | Admitting: Obstetrics & Gynecology

## 2016-10-01 VITALS — BP 128/83 | HR 99 | Resp 18 | Ht 63.0 in | Wt 141.0 lb

## 2016-10-01 DIAGNOSIS — N921 Excessive and frequent menstruation with irregular cycle: Secondary | ICD-10-CM | POA: Diagnosis not present

## 2016-10-01 DIAGNOSIS — Z113 Encounter for screening for infections with a predominantly sexual mode of transmission: Secondary | ICD-10-CM

## 2016-10-01 MED ORDER — NORETHINDRONE ACETATE 5 MG PO TABS
5.0000 mg | ORAL_TABLET | Freq: Every day | ORAL | 2 refills | Status: DC
Start: 2016-10-01 — End: 2016-11-28

## 2016-10-01 NOTE — Progress Notes (Addendum)
GYNECOLOGY OFFICE VISIT NOTE  History:  36 y.o. G1P1001 here today for evaluation of continued breakthrough, irregular bleeding since year despite being on Sprintec.  Bleeding is sporadic, sometimes very heavy and soaks a super tampon in 1.5 hours.  Can have about 5-10 days every month without some sort of bleeding. She is very concerned something is wrong.  Periods come during hormone and placebo portions of the pills.  Sometimes associated with cramping. No lightheadedness or dizziness.   She denies any abnormal vaginal discharge or other gynecologic concerns.   Past Medical History:  Diagnosis Date  . Anxiety   . Headache(784.0)    SEVERE MIGRANES    Past Surgical History:  Procedure Laterality Date  . KNEE ARTHROSCOPY      X 4 ON LEFT KNEE  . KNEE ARTHROSCOPY W/ OATS PROCEDURE      X1 ON LEFT KNEE  . WISDOM TOOTH EXTRACTION      ALL 4 REMOVE    The following portions of the patient's history were reviewed and updated as appropriate: allergies, current medications, past family history, past medical history, past social history, past surgical history and problem list.   Health Maintenance:  ASCUS pap but negative HRHPV in 09/2015 (had BV and yeast at that time).   Review of Systems:  Pertinent items noted in HPI and remainder of comprehensive ROS otherwise negative.   Objective:  Physical Exam BP 128/83   Pulse 99   Resp 18   Ht 5\' 3"  (1.6 m)   Wt 141 lb (64 kg)   LMP 09/13/2016   BMI 24.98 kg/m  CONSTITUTIONAL: Well-developed, well-nourished female in no acute distress.  HENT:  Normocephalic, atraumatic. External right and left ear normal. Oropharynx is clear and moist EYES: Conjunctivae and EOM are normal. Pupils are equal, round, and reactive to light. No scleral icterus.  NECK: Normal range of motion, supple, no masses SKIN: Skin is warm and dry. No rash noted. Not diaphoretic. No erythema. No pallor. NEUROLOGIC: Alert and oriented to person, place, and time.  Normal reflexes, muscle tone coordination. No cranial nerve deficit noted. PSYCHIATRIC: Normal mood and affect. Normal behavior. Normal judgment and thought content. CARDIOVASCULAR: Normal heart rate noted RESPIRATORY: Effort and breath sounds normal, no problems with respiration noted ABDOMEN: Soft, no distention noted.   PELVIC: Normal appearing external genitalia; normal appearing vaginal mucosa and cervix.  No abnormal discharge noted.  Normal uterine size, no other palpable masses, no uterine or adnexal tenderness. MUSCULOSKELETAL: Normal range of motion. No edema noted.  Labs and Imaging No results found.  Assessment & Plan:  1. Menorrhagia with irregular cycle 2. Breakthrough bleeding on birth control pills Patient has abnormal uterine bleeding . She has a normal exam, no evidence of lesions.  Will order abnormal uterine bleeding evaluation labs and pelvic ultrasound to evaluate for any structural gynecologic abnormalities.  Patient is unwilling to try other long term hormonal management at this time; has tried IUD in past. Aygestin prescribed for now.  Discussed endometrial ablation, information given to her to review at home.  She is also considering concurrent bilateral tubal sterilization.  - norethindrone (AYGESTIN) 5 MG tablet; Take 1 tablet (5 mg total) by mouth daily.  Dispense: 30 tablet; Refill: 2 - CBC - Beta HCG, Quant - Prolactin - Testosterone,Free and Total - TSH - Cervicovaginal ancillary - US Pelvis Complete; Future - US Transvaginal Non-OB; Future  Routine preventative health maintenance measures emphasized. Please refer to After Visit Summary for other counseling  recommendations.   Return in about 2 weeks (around 10/15/2016) for Followup AUB (schedule after ultrasound).   Total face-to-face time with patient: 25 minutes. Over 50% of encounter was spent on counseling and coordination of care.   Verita Schneiders, MD, [place] Attending [place], [facility] for [employer], [place]

## 2016-10-01 NOTE — Patient Instructions (Signed)
Endometrial Ablation Endometrial ablation is a procedure that destroys the thin inner layer of the lining of the uterus (endometrium). This procedure may be done:  To stop heavy periods.  To stop bleeding that is causing anemia.  To control irregular bleeding.  To treat bleeding caused by small tumors (fibroids) in the endometrium.  This procedure is often an alternative to major surgery, such as removal of the uterus and cervix (hysterectomy). As a result of this procedure:  You may not be able to have children. However, if you are premenopausal (you have not gone through menopause): ? You may still have a small chance of getting pregnant. ? You will need to use a reliable method of birth control after the procedure to prevent pregnancy.  You may stop having a menstrual period, or you may have only a small amount of bleeding during your period. Menstruation may return several years after the procedure.  Tell a health care provider about:  Any allergies you have.  All medicines you are taking, including vitamins, herbs, eye drops, creams, and over-the-counter medicines.  Any problems you or family members have had with the use of anesthetic medicines.  Any blood disorders you have.  Any surgeries you have had.  Any medical conditions you have. What are the risks? Generally, this is a safe procedure. However, problems may occur, including:  A hole (perforation) in the uterus or bowel.  Infection of the uterus, bladder, or vagina.  Bleeding.  Damage to other structures or organs.  An air bubble in the lung (air embolus).  Problems with pregnancy after the procedure.  Failure of the procedure.  Decreased ability to diagnose cancer in the endometrium.  What happens before the procedure?  You will have tests of your endometrium to make sure there are no pre-cancerous cells or cancer cells present.  You may have an ultrasound of the uterus.  You may be given  medicines to thin the endometrium.  Ask your health care provider about: ? Changing or stopping your regular medicines. This is especially important if you take diabetes medicines or blood thinners. ? Taking medicines such as aspirin and ibuprofen. These medicines can thin your blood. Do not take these medicines before your procedure if your doctor tells you not to.  Plan to have someone take you home from the hospital or clinic. What happens during the procedure?  You will lie on an exam table with your feet and legs supported as in a pelvic exam.  To lower your risk of infection: ? Your health care team will wash or sanitize their hands and put on germ-free (sterile) gloves. ? Your genital area will be washed with soap.  An IV tube will be inserted into one of your veins.  You will be given a medicine to help you relax (sedative).  A surgical instrument with a light and camera (resectoscope) will be inserted into your vagina and moved into your uterus. This allows your surgeon to see inside your uterus.  Endometrial tissue will be removed using one of the following methods: ? Radiofrequency. This method uses a radiofrequency-alternating electric current to remove the endometrium. ? Cryotherapy. This method uses extreme cold to freeze the endometrium. ? Heated-free liquid. This method uses a heated saltwater (saline) solution to remove the endometrium. ? Microwave. This method uses high-energy microwaves to heat up the endometrium and remove it. ? Thermal balloon. This method involves inserting a catheter with a balloon tip into the uterus. The balloon tip is   filled with heated fluid to remove the endometrium. The procedure may vary among health care providers and hospitals. What happens after the procedure?  Your blood pressure, heart rate, breathing rate, and blood oxygen level will be monitored until the medicines you were given have worn off.  As tissue healing occurs, you may  notice vaginal bleeding for 4-6 weeks after the procedure. You may also experience: ? Cramps. ? Thin, watery vaginal discharge that is light pink or brown in color. ? A need to urinate more frequently than usual. ? Nausea.  Do not drive for 24 hours if you were given a sedative.  Do not have sex or insert anything into your vagina until your health care provider approves. Summary  Endometrial ablation is done to treat the many causes of heavy menstrual bleeding.  The procedure may be done only after medications have been tried to control the bleeding.  Plan to have someone take you home from the hospital or clinic. This information is not intended to replace advice given to you by your health care provider. Make sure you discuss any questions you have with your health care provider. Document Released: 01/12/2004 Document Revised: 03/21/2016 Document Reviewed: 03/21/2016 Elsevier Interactive Patient Education  2017 Elsevier Inc.     Laparoscopic Tubal Ligation Laparoscopic tubal ligation is a procedure to close the fallopian tubes. This is done so that you cannot get pregnant. When the fallopian tubes are closed, the eggs that your ovaries release cannot enter the uterus, and sperm cannot reach the released eggs. A laparoscopic tubal ligation is sometimes called "getting your tubes tied." You should not have this procedure if you want to get pregnant someday or if you are unsure about having more children. Tell a health care provider about:  Any allergies you have.  All medicines you are taking, including vitamins, herbs, eye drops, creams, and over-the-counter medicines.  Any problems you or family members have had with anesthetic medicines.  Any blood disorders you have.  Any surgeries you have had.  Any medical conditions you have.  Whether you are pregnant or may be pregnant.  Any past pregnancies. What are the risks? Generally, this is a safe procedure. However,  problems may occur, including:  Infection.  Bleeding.  Injury to surrounding organs.  Side effects from anesthetics.  Failure of the procedure.  This procedure can increase your risk of a kind of pregnancy in which a fertilized egg attaches to the outside of the uterus (ectopic pregnancy). What happens before the procedure?  Ask your health care provider about: ? Changing or stopping your regular medicines. This is especially important if you are taking diabetes medicines or blood thinners. ? Taking medicines such as aspirin and ibuprofen. These medicines can thin your blood. Do not take these medicines before your procedure if your health care provider instructs you not to.  Follow instructions from your health care provider about eating and drinking restrictions.  Plan to have someone take you home after the procedure.  If you go home right after the procedure, plan to have someone with you for 24 hours. What happens during the procedure?  You will be given one or more of the following: ? A medicine to help you relax (sedative). ? A medicine to numb the area (local anesthetic). ? A medicine to make you fall asleep (general anesthetic). ? A medicine that is injected into an area of your body to numb everything below the injection site (regional anesthetic).  An IV tube  will be inserted into one of your veins. It will be used to give you medicines and fluids during the procedure.  Your bladder may be emptied with a small tube (catheter).  If you have been given a general anesthetic, a tube will be put down your throat to help you breathe.  Two small cuts (incisions) will be made in your lower abdomen and near your belly button.  Your abdomen will be inflated with a gas. This will let the surgeon see better and will give the surgeon room to work.  A thin, lighted tube (laparoscope) with a camera attached will be inserted into your abdomen through one of the incisions. Small  instruments will be inserted through the other incision.  The fallopian tubes will be tied off, burned (cauterized), or blocked with a clip, ring, or clamp. A small portion in the center of each fallopian tube may be removed.  The gas will be released from the abdomen.  The incisions will be closed with stitches (sutures).  A bandage (dressing) will be placed over the incisions. The procedure may vary among health care providers and hospitals. What happens after the procedure?  Your blood pressure, heart rate, breathing rate, and blood oxygen level will be monitored often until the medicines you were given have worn off.  You will be given medicine to help with pain, nausea, and vomiting as needed. This information is not intended to replace advice given to you by your health care provider. Make sure you discuss any questions you have with your health care provider. Document Released: 06/10/2000 Document Revised: 08/10/2015 Document Reviewed: 02/12/2015 Elsevier Interactive Patient Education  [employer].

## 2016-10-01 NOTE — Addendum Note (Signed)
Addended by: Verita Schneiders A on: 10/01/2016 12:10 PM   Modules accepted: Orders

## 2016-10-02 LAB — CERVICOVAGINAL ANCILLARY ONLY
Chlamydia: NEGATIVE
Neisseria Gonorrhea: NEGATIVE
Trichomonas: NEGATIVE

## 2016-10-03 LAB — PROLACTIN: Prolactin: 16.4 ng/mL (ref 4.8–23.3)

## 2016-10-03 LAB — CBC
Hematocrit: 42.4 % (ref 34.0–46.6)
Hemoglobin: 14.5 g/dL (ref 11.1–15.9)
MCH: 29.8 pg (ref 26.6–33.0)
MCHC: 34.2 g/dL (ref 31.5–35.7)
MCV: 87 fL (ref 79–97)
Platelets: 186 10*3/uL (ref 150–379)
RBC: 4.86 x10E6/uL (ref 3.77–5.28)
RDW: 13 % (ref 12.3–15.4)
WBC: 3.8 10*3/uL (ref 3.4–10.8)

## 2016-10-03 LAB — BETA HCG QUANT (REF LAB): hCG Quant: <1 m[IU]/mL

## 2016-10-03 LAB — TSH: TSH: 4.44 u[IU]/mL (ref 0.450–4.500)

## 2016-10-03 LAB — TESTOSTERONE,FREE AND TOTAL
Testosterone, Free: 1.1 pg/mL (ref 0.0–4.2)
Testosterone: 21 ng/dL (ref 8–48)

## 2016-10-07 ENCOUNTER — Ambulatory Visit
Admission: RE | Admit: 2016-10-07 | Discharge: 2016-10-07 | Disposition: A | Discharge status: Home / Self Care | Payer: BLUE CROSS/BLUE SHIELD | Source: Ambulatory Visit | Attending: Obstetrics & Gynecology | Admitting: Obstetrics & Gynecology

## 2016-10-07 DIAGNOSIS — N920 Excessive and frequent menstruation with regular cycle: Secondary | ICD-10-CM | POA: Insufficient documentation

## 2016-10-07 DIAGNOSIS — N921 Excessive and frequent menstruation with irregular cycle: Secondary | ICD-10-CM | POA: Diagnosis not present

## 2016-10-07 DIAGNOSIS — N939 Abnormal uterine and vaginal bleeding, unspecified: Secondary | ICD-10-CM | POA: Diagnosis not present

## 2016-10-15 ENCOUNTER — Ambulatory Visit (INDEPENDENT_AMBULATORY_CARE_PROVIDER_SITE_OTHER): Payer: BLUE CROSS/BLUE SHIELD | Admitting: Obstetrics & Gynecology

## 2016-10-15 ENCOUNTER — Encounter: Payer: Self-pay | Admitting: Obstetrics & Gynecology

## 2016-10-15 VITALS — BP 136/86 | HR 81 | Wt 143.0 lb

## 2016-10-15 DIAGNOSIS — N939 Abnormal uterine and vaginal bleeding, unspecified: Secondary | ICD-10-CM | POA: Diagnosis not present

## 2016-10-15 MED ORDER — MEGESTROL ACETATE 40 MG PO TABS: 40.0000 mg | ORAL_TABLET | Freq: Two times a day (BID) | ORAL | 5 refills | Status: DC

## 2016-10-15 NOTE — Progress Notes (Signed)
GYNECOLOGY OFFICE VISIT NOTE  History:  36 y.o. G1P1001 here today for follow up after evaluation and management of AUB/breatkthrough bleeding on Sprintec on 10/01/2016. Labs and ultrasound were ordered; Aygestin was also prescribed.  Patient reports that Aygestin did not help much with the bleeding; but she also self-discontinued it due to severe mood effects.  She has thought about surgical management; does not want endometrial ablation as she found out many women who get this in their 30s end up needing repeat procedures.  She desires definitive surgical management with hysterectomy.  She denies any abnormal vaginal discharge, pelvic pain or other concerns.   Past Medical History:  Diagnosis Date  . Anxiety   . Headache(784.0)    SEVERE MIGRANES    Past Surgical History:  Procedure Laterality Date  . KNEE ARTHROSCOPY      X 4 ON LEFT KNEE  . KNEE ARTHROSCOPY W/ OATS PROCEDURE      X1 ON LEFT KNEE  . WISDOM TOOTH EXTRACTION      ALL 4 REMOVE    The following portions of the patient's history were reviewed and updated as appropriate: allergies, current medications, past family history, past medical history, past social history, past surgical history and problem list.   Health Maintenance:  ASCUS pap but negative HRHPV in 09/2015 (had BV and yeast at that time).   Review of Systems:  Pertinent items noted in HPI and remainder of comprehensive ROS otherwise negative.   Objective:  Physical Exam BP 136/86   Pulse 81   Wt 143 lb (64.9 kg)   BMI 25.33 kg/m  CONSTITUTIONAL: Well-developed, well-nourished female in no acute distress.  HENT:  Normocephalic, atraumatic. External right and left ear normal. Oropharynx is clear and moist EYES: Conjunctivae and EOM are normal. Pupils are equal, round, and reactive to light. No scleral icterus.  NECK: Normal range of motion, supple, no masses SKIN: Skin is warm and dry. No rash noted. Not diaphoretic. No erythema. No pallor. NEUROLOGIC:  Alert and oriented to person, place, and time. Normal reflexes, muscle tone coordination. No cranial nerve deficit noted. PSYCHIATRIC: Normal mood and affect. Normal behavior. Normal judgment and thought content. CARDIOVASCULAR: Normal heart rate noted RESPIRATORY: Effort and breath sounds normal, no problems with respiration noted ABDOMEN: Soft, no distention noted.   PELVIC: Deferred MUSCULOSKELETAL: Normal range of motion. No edema noted.  Labs and Imaging  Results for orders placed or performed in visit on 10/01/16 (from the past 504 hour(s))  Cervicovaginal ancillary only   Collection Time: 10/01/16 12:00 AM  Result Value Ref Range   Chlamydia Negative    Neisseria gonorrhea Negative    Trichomonas Negative   CBC   Collection Time: 10/01/16 11:03 AM  Result Value Ref Range   WBC 3.8 3.4 - 10.8 x10E3/uL   RBC 4.86 3.77 - 5.28 x10E6/uL   Hemoglobin 14.5 11.1 - 15.9 g/dL   Hematocrit 42.4 34.0 - 46.6 %   MCV 87 79 - 97 fL   MCH 29.8 26.6 - 33.0 pg   MCHC 34.2 31.5 - 35.7 g/dL   RDW 13.0 12.3 - 15.4 %   Platelets 186 150 - 379 x10E3/uL  Beta HCG, Quant   Collection Time: 10/01/16 11:03 AM  Result Value Ref Range   hCG Quant <1 mIU/mL  Prolactin   Collection Time: 10/01/16 11:03 AM  Result Value Ref Range   Prolactin 16.4 4.8 - 23.3 ng/mL  Testosterone,Free and Total   Collection Time: 10/01/16 11:03 AM  Result Value Ref Range   Testosterone 21 8 - 48 ng/dL   Testosterone, Free 1.1 0.0 - 4.2 pg/mL  TSH   Collection Time: 10/01/16 11:03 AM  Result Value Ref Range   TSH 4.440 0.450 - 4.500 uIU/mL    US Transvaginal Non-ob  Result Date: 10/07/2016 CLINICAL DATA:  Abnormal uterine bleeding for 1 year. LMP 09/13/2016. EXAM: TRANSABDOMINAL AND TRANSVAGINAL ULTRASOUND OF PELVIS TECHNIQUE: Both transabdominal and transvaginal ultrasound examinations of the pelvis were performed. Transabdominal technique was performed for global imaging of the pelvis including uterus,  ovaries, adnexal regions, and pelvic cul-de-sac. It was necessary to proceed with endovaginal exam following the transabdominal exam to visualize the uterus, endometrium, ovaries, adnexal region. COMPARISON:  07/23/2011 OB ultrasound FINDINGS: Uterus Measurements: 8.1 x 4.7 x 4.9 cm. No fibroids or other mass visualized. Endometrium Thickness: 4.0 mm.  No focal abnormality visualized. Right ovary Measurements: 3.3 x 1.0 x 1.9 cm. Normal appearance/no adnexal mass. Left ovary Measurements: The ovary is not visualized, either absent or obscured . No adnexal mass. Other findings No abnormal free fluid. IMPRESSION: 1. Normal appearance of the uterus/endometrium. If bleeding remains unresponsive to hormonal or medical therapy, sonohysterogram should be considered for focal lesion work-up. (Ref: Radiological Reasoning: Algorithmic Workup of Abnormal Vaginal Bleeding with Endovaginal Sonography and Sonohysterography. AJR 2008; 035:K09-38) 2. Normal appearance of the right ovary ; nonvisualized left ovary. Electronically Signed   By: Nolon Nations M.D.   On: 10/07/2016 16:17   US Pelvis Complete  Result Date: 10/07/2016 CLINICAL DATA:  Abnormal uterine bleeding for 1 year. LMP 09/13/2016. EXAM: TRANSABDOMINAL AND TRANSVAGINAL ULTRASOUND OF PELVIS TECHNIQUE: Both transabdominal and transvaginal ultrasound examinations of the pelvis were performed. Transabdominal technique was performed for global imaging of the pelvis including uterus, ovaries, adnexal regions, and pelvic cul-de-sac. It was necessary to proceed with endovaginal exam following the transabdominal exam to visualize the uterus, endometrium, ovaries, adnexal region. COMPARISON:  07/23/2011 OB ultrasound FINDINGS: Uterus Measurements: 8.1 x 4.7 x 4.9 cm. No fibroids or other mass visualized. Endometrium Thickness: 4.0 mm.  No focal abnormality visualized. Right ovary Measurements: 3.3 x 1.0 x 1.9 cm. Normal appearance/no adnexal mass. Left ovary  Measurements: The ovary is not visualized, either absent or obscured . No adnexal mass. Other findings No abnormal free fluid. IMPRESSION: 1. Normal appearance of the uterus/endometrium. If bleeding remains unresponsive to hormonal or medical therapy, sonohysterogram should be considered for focal lesion work-up. (Ref: Radiological Reasoning: Algorithmic Workup of Abnormal Vaginal Bleeding with Endovaginal Sonography and Sonohysterography. AJR 2008; 182:X93-71) 2. Normal appearance of the right ovary ; nonvisualized left ovary. Electronically Signed   By: Nolon Nations M.D.   On: 10/07/2016 16:17    Assessment & Plan:  1. Abnormal uterine bleeding (AUB) All results reviewed with patient in detail. Discussed interim need for AUB control, suggested alternate progestin trial. She agreed to this plan for now.  - megestrol (MEGACE) 40 MG tablet; Take 1 tablet (40 mg total) by mouth 2 (two) times daily. Can increase to two tablets twice a day in the event of heavy bleeding  Dispense: 60 tablet; Refill: 5 Patient desires definitive management with hysterectomy.  I proposed doing a total vaginal hysterectomy (TVH) and prophylactic bilateral salpingectomy.  No indication for oophorectomy.  Patient agrees with this proposed surgery.  The risks of surgery were discussed in detail with the patient including but not limited to: bleeding which may require transfusion or reoperation; infection which may require antibiotics; injury to bowel, bladder, ureters  or other surrounding organs; need for additional procedures including laparotomy; thromboembolic phenomenon, incisional problems and other postoperative/anesthesia complications.  Patient was also advised that she will remain in house for 1 nights; and expected recovery time after a hysterectomy is 4-6weeks.  Likelihood of success in alleviating the patient's symptoms was discussed.   She was told that she will be contacted by our surgical scheduler regarding the time  and date of her surgery. She will have another appointment prior to her surgery for her annual exam, already scheduled in September.  In the meantime, she will try Megace; bleeding precautions were reviewed. Printed patient education handouts about the procedure was given to the patient to review at home.  Routine preventative health maintenance measures emphasized. Please refer to After Visit Summary for other counseling recommendations.   Return if symptoms worsen or fail to improve.   Total face-to-face time with patient: 25 minutes. Over 50% of encounter was spent on counseling and coordination of care.   Verita Schneiders, MD, [place] Attending [place], [facility] for [employer], [place]

## 2016-10-15 NOTE — Patient Instructions (Addendum)
Megestrol (Megace) vs Medroxyprogesterone (Provera)    Vaginal Hysterectomy A vaginal hysterectomy is a procedure to remove all or part of the uterus through a small incision in the vagina. In this procedure, your health care provider may remove your entire uterus, including the lower end (cervix). You may need a vaginal hysterectomy to treat:  Uterine fibroids.  A condition that causes the lining of the uterus to grow in other areas (endometriosis).  Problems with pelvic support.  Cancer of the cervix, ovaries, uterus, or tissue that lines the uterus (endometrium).  Excessive (dysfunctional) uterine bleeding.  When removing your uterus, your health care provider may also remove the organs that produce eggs (ovaries) and the tubes that carry eggs to your uterus (fallopian tubes). After a vaginal hysterectomy, you will no longer be able to have a baby. You will also no longer get your menstrual period. Tell a health care provider about:  Any allergies you have.  All medicines you are taking, including vitamins, herbs, eye drops, creams, and over-the-counter medicines.  Any problems you or family members have had with anesthetic medicines.  Any blood disorders you have.  Any surgeries you have had.  Any medical conditions you have.  Whether you are pregnant or may be pregnant. What are the risks? Generally, this is a safe procedure. However, problems may occur, including:  Bleeding.  Infection.  A blood clot that forms in your leg and travels to your lungs (pulmonary embolism).  Damage to surrounding organs.  Pain during sex.  What happens before the procedure?  Ask your health care provider what organs will be removed during surgery.  Ask your health care provider about: ? Changing or stopping your regular medicines. This is especially important if you are taking diabetes medicines or blood thinners. ? Taking medicines such as aspirin and ibuprofen. These medicines  can thin your blood. Do not take these medicines before your procedure if your health care provider instructs you not to.  Follow instructions from your health care provider about eating or drinking restrictions.  Do not use any tobacco products, such as cigarettes, chewing tobacco, and e-cigarettes. If you need help quitting, ask your health care provider.  Plan to have someone take you home after discharge from the hospital. What happens during the procedure?  To reduce your risk of infection: ? Your health care team will wash or sanitize their hands. ? Your skin will be washed with soap.  An IV tube will be inserted into one of your veins.  You may be given antibiotic medicine to help prevent infection.  You will be given one or more of the following: ? A medicine to help you relax (sedative). ? A medicine to numb the area (local anesthetic). ? A medicine to make you fall asleep (general anesthetic). ? A medicine that is injected into an area of your body to numb everything beyond the injection site (regional anesthetic).  Your surgeon will make an incision in your vagina.  Your surgeon will locate and remove all or part of your uterus.  Your ovaries and fallopian tubes may be removed at the same time.  The incision will be closed with stitches (sutures) that dissolve over time. The procedure may vary among health care providers and hospitals. What happens after the procedure?  Your blood pressure, heart rate, breathing rate, and blood oxygen level will be monitored often until the medicines you were given have worn off.  You will be encouraged to get up and walk  around after a few hours to help prevent complications.  You may have IV tubes in place for a few days.  You will be given pain medicine as needed.  Do not drive for 24 hours if you were given a sedative. This information is not intended to replace advice given to you by your health care provider. Make sure you  discuss any questions you have with your health care provider. Document Released: 06/26/2015 Document Revised: 08/10/2015 Document Reviewed: 03/19/2015 Elsevier Interactive Patient Education  [employer].

## 2016-10-17 ENCOUNTER — Encounter: Payer: Self-pay | Admitting: Obstetrics & Gynecology

## 2016-10-17 DIAGNOSIS — L814 Other melanin hyperpigmentation: Secondary | ICD-10-CM | POA: Diagnosis not present

## 2016-10-21 ENCOUNTER — Encounter (HOSPITAL_COMMUNITY): Payer: Self-pay

## 2016-10-28 ENCOUNTER — Ambulatory Visit: Payer: BLUE CROSS/BLUE SHIELD | Admitting: Family Medicine

## 2016-11-28 ENCOUNTER — Encounter: Payer: Self-pay | Admitting: Obstetrics & Gynecology

## 2016-11-28 ENCOUNTER — Ambulatory Visit (INDEPENDENT_AMBULATORY_CARE_PROVIDER_SITE_OTHER): Payer: BLUE CROSS/BLUE SHIELD | Admitting: Obstetrics & Gynecology

## 2016-11-28 VITALS — BP 127/91 | HR 80 | Ht 63.0 in | Wt 144.0 lb

## 2016-11-28 DIAGNOSIS — Z1151 Encounter for screening for human papillomavirus (HPV): Secondary | ICD-10-CM | POA: Diagnosis not present

## 2016-11-28 DIAGNOSIS — Z124 Encounter for screening for malignant neoplasm of cervix: Secondary | ICD-10-CM

## 2016-11-28 DIAGNOSIS — Z01818 Encounter for other preprocedural examination: Secondary | ICD-10-CM

## 2016-11-28 DIAGNOSIS — N921 Excessive and frequent menstruation with irregular cycle: Secondary | ICD-10-CM

## 2016-11-28 DIAGNOSIS — Z01419 Encounter for gynecological examination (general) (routine) without abnormal findings: Secondary | ICD-10-CM

## 2016-11-28 NOTE — Patient Instructions (Signed)
Vaginal Hysterectomy A vaginal hysterectomy is a procedure to remove all or part of the uterus through a small incision in the vagina. In this procedure, your health care provider may remove your entire uterus, including the lower end (cervix). You may need a vaginal hysterectomy to treat:  Uterine fibroids.  A condition that causes the lining of the uterus to grow in other areas (endometriosis).  Problems with pelvic support.  Cancer of the cervix, ovaries, uterus, or tissue that lines the uterus (endometrium).  Excessive (dysfunctional) uterine bleeding.  When removing your uterus, your health care provider may also remove the organs that produce eggs (ovaries) and the tubes that carry eggs to your uterus (fallopian tubes). After a vaginal hysterectomy, you will no longer be able to have a baby. You will also no longer get your menstrual period. Tell a health care provider about:  Any allergies you have.  All medicines you are taking, including vitamins, herbs, eye drops, creams, and over-the-counter medicines.  Any problems you or family members have had with anesthetic medicines.  Any blood disorders you have.  Any surgeries you have had.  Any medical conditions you have.  Whether you are pregnant or may be pregnant. What are the risks? Generally, this is a safe procedure. However, problems may occur, including:  Bleeding.  Infection.  A blood clot that forms in your leg and travels to your lungs (pulmonary embolism).  Damage to surrounding organs.  Pain during sex.  What happens before the procedure?  Ask your health care provider what organs will be removed during surgery.  Ask your health care provider about: ? Changing or stopping your regular medicines. This is especially important if you are taking diabetes medicines or blood thinners. ? Taking medicines such as aspirin and ibuprofen. These medicines can thin your blood. Do not take these medicines before  your procedure if your health care provider instructs you not to.  Follow instructions from your health care provider about eating or drinking restrictions.  Do not use any tobacco products, such as cigarettes, chewing tobacco, and e-cigarettes. If you need help quitting, ask your health care provider.  Plan to have someone take you home after discharge from the hospital. What happens during the procedure?  To reduce your risk of infection: ? Your health care team will wash or sanitize their hands. ? Your skin will be washed with soap.  An IV tube will be inserted into one of your veins.  You may be given antibiotic medicine to help prevent infection.  You will be given one or more of the following: ? A medicine to help you relax (sedative). ? A medicine to numb the area (local anesthetic). ? A medicine to make you fall asleep (general anesthetic). ? A medicine that is injected into an area of your body to numb everything beyond the injection site (regional anesthetic).  Your surgeon will make an incision in your vagina.  Your surgeon will locate and remove all or part of your uterus.  Your ovaries and fallopian tubes may be removed at the same time.  The incision will be closed with stitches (sutures) that dissolve over time. The procedure may vary among health care providers and hospitals. What happens after the procedure?  Your blood pressure, heart rate, breathing rate, and blood oxygen level will be monitored often until the medicines you were given have worn off.  You will be encouraged to get up and walk around after a few hours to help prevent   complications.  You may have IV tubes in place for a few days.  You will be given pain medicine as needed.  Do not drive for 24 hours if you were given a sedative. This information is not intended to replace advice given to you by your health care provider. Make sure you discuss any questions you have with your health care  provider. Document Released: 06/26/2015 Document Revised: 08/10/2015 Document Reviewed: 03/19/2015 Elsevier Interactive Patient Education  2018 Elsevier Inc.  

## 2016-11-28 NOTE — Progress Notes (Signed)
GYNECOLOGY ANNUAL PREVENTATIVE CARE ENCOUNTER NOTE  Subjective:   Stephanie [name] is a 36 y.o. G61P1001 female here for a routine annual gynecologic exam.  Current complaints: none.  Megace is controlling AUB for now, but she is excited about upcoming TVH on 12/17/16.  Denies abnormal vaginal bleeding, discharge, pelvic pain, problems with intercourse or other gynecologic concerns.    Gynecologic History No LMP recorded. Patient is not currently having periods (Reason: Oral contraceptives). Contraception: OCP (estrogen/progesterone) Last Pap:  ASCUS pap but negative HRHPV in 09/2015   Obstetric History OB History  Gravida Para Term Preterm AB Living  1 1 1  0 0 1  SAB TAB Ectopic Multiple Live Births  0 0 0 0 1    # Outcome Date GA Lbr Len/2nd Weight Sex Delivery Anes PTL Lv  1 Term 07/25/11 [redacted]w[redacted]d 05:32 / 00:50 8 lb 0.6 oz (3.646 kg) F Vag-Spont EPI  LIV      Past Medical History:  Diagnosis Date  . Anxiety   . Headache(784.0)    SEVERE MIGRANES    Past Surgical History:  Procedure Laterality Date  . KNEE ARTHROSCOPY      X 4 ON LEFT KNEE  . KNEE ARTHROSCOPY W/ OATS PROCEDURE      X1 ON LEFT KNEE  . WISDOM TOOTH EXTRACTION      ALL 4 REMOVE    Current Outpatient Prescriptions on File Prior to Visit  Medication Sig Dispense Refill  . megestrol (MEGACE) 40 MG tablet Take 1 tablet (40 mg total) by mouth 2 (two) times daily. Can increase to two tablets twice a day in the event of heavy bleeding 60 tablet 5  . norgestimate-ethinyl estradiol (SPRINTEC 28) 0.25-35 MG-MCG tablet Take 1 tablet by mouth daily. 28 tablet 16   No current facility-administered medications on file prior to visit.     No Known Allergies  Social History   Social History  . Marital status: Single    Spouse name: N/A  . Number of children: N/A  . Years of education: N/A   Occupational History  . Not on file.   Social History Main Topics  . Smoking status: Never Smoker  .  Smokeless tobacco: Never Used  . Alcohol use No  . Drug use: No  . Sexual activity: Yes    Partners: Male    Birth control/ protection: Pill   Other Topics Concern  . Not on file   Social History Narrative  . No narrative on file    Family History  Problem Relation Age of Onset  . Anxiety disorder Mother   . Alcohol abuse Father   . Kidney disease Father   . Heart disease Father   . Mental illness Sister   . Alcohol abuse Maternal Aunt   . Heart disease Maternal Grandmother   . Mental illness Paternal Aunt     The following portions of the patient's history were reviewed and updated as appropriate: allergies, current medications, past family history, past medical history, past social history, past surgical history and problem list.  Review of Systems Pertinent items noted in HPI and remainder of comprehensive ROS otherwise negative.   Objective:  BP (!) 127/91   Pulse 80   Ht 5\' 3"  (1.6 m)   Wt 144 lb (65.3 kg)   BMI 25.51 kg/m  CONSTITUTIONAL: Well-developed, well-nourished female in no acute distress.  HENT:  Normocephalic, atraumatic, External right and left ear normal. Oropharynx is clear and moist EYES: Conjunctivae and  EOM are normal. Pupils are equal, round, and reactive to light. No scleral icterus.  NECK: Normal range of motion, supple, no masses.  Normal thyroid.  SKIN: Skin is warm and dry. No rash noted. Not diaphoretic. No erythema. No pallor. NEUROLOGIC: Alert and oriented to person, place, and time. Normal reflexes, muscle tone coordination. No cranial nerve deficit noted. PSYCHIATRIC: Normal mood and affect. Normal behavior. Normal judgment and thought content. CARDIOVASCULAR: Normal heart rate noted, regular rhythm RESPIRATORY: Clear to auscultation bilaterally. Effort and breath sounds normal, no problems with respiration noted. BREASTS: Symmetric in size. No masses, skin changes, nipple drainage, or lymphadenopathy. ABDOMEN: Soft, normal bowel  sounds, no distention noted.  No tenderness, rebound or guarding.  PELVIC: Normal appearing external genitalia; normal appearing vaginal mucosa and cervix.  No abnormal discharge noted.  Pap smear obtained.  Normal uterine size, no other palpable masses, no uterine or adnexal tenderness. MUSCULOSKELETAL: Normal range of motion. No tenderness.  No cyanosis, clubbing, or edema.  2+ distal pulses.   Assessment and Plan:  1. Well woman exam with routine gynecological exam Will follow up results of pap smear and manage accordingly. - Cytology - PAP  2. Menorrhagia with irregular cycle 3. Preoperative exam for gynecologic surgery Total vaginal hysterectomy (TVH) and prophylactic bilateral salpingectomy scheduled for 12/17/16.  The risks of surgery were re-discussed in detail with the patient including but not limited to: bleeding which may require transfusion or reoperation; infection which may require antibiotics; injury to bowel, bladder, ureters or other surrounding organs; need for additional procedures including laparotomy; thromboembolic phenomenon, incisional problems and other postoperative/anesthesia complications.  Patient was also advised that she will remain in house for 1 night; and expected recovery time after a hysterectomy is 4-6weeks.  Patient will need FMLA papers; desires six weeks off.  Likelihood of success in alleviating the patient's symptoms was discussed.   In the meantime, she will continue Megace for AUB; bleeding precautions were reviewed. Printed patient education handouts about the procedure was given to the patient to review at home.    Return in about 7 weeks (around 01/13/2017) for Postoperative check.   Verita Schneiders, MD, [place] Attending Obstetrician & Gynecologist, [place] for [facility]

## 2016-12-04 LAB — CYTOLOGY - PAP
Diagnosis: NEGATIVE
HPV 16/18/45 genotyping: NEGATIVE
HPV: DETECTED — AB

## 2016-12-04 NOTE — Patient Instructions (Addendum)
Your procedure is scheduled on:  Tuesday, Oct. 2, 2018  Enter through the [employer] [facility] at:  11:30 AM  Pick up the phone at the desk and dial [phone].  Call this number if you have problems the morning of surgery: [phone].  Remember: Do NOT eat food:  After Midnight Monday  Do NOT drink clear liquids after:  7:00 AM Tuesday  Take these medicines the morning of surgery with a SIP OF WATER:  None  Stop ALL herbal medications at this time  Do NOT smoke the day of surgery.  Do NOT wear jewelry (body piercing), metal hair clips/bobby pins, make-up, artifical eyelashes or nail polish. Do NOT wear lotions, powders, or perfumes.  You may wear deodorant. Do NOT shave for 48 hours prior to surgery. Do NOT bring valuables to the hospital. Contacts, dentures, or bridgework may not be worn into surgery.  Leave suitcase in car.  After surgery it may be brought to your room.  For patients admitted to the hospital, checkout time is 11:00 AM the day of discharge.  Bring a copy of your healthcare power of attorney and living will documents.

## 2016-12-05 ENCOUNTER — Encounter: Payer: Self-pay | Admitting: Obstetrics & Gynecology

## 2016-12-05 ENCOUNTER — Encounter (HOSPITAL_COMMUNITY)
Admission: RE | Admit: 2016-12-05 | Discharge: 2016-12-05 | Disposition: A | Discharge status: Home / Self Care | Payer: BLUE CROSS/BLUE SHIELD | Source: Ambulatory Visit | Attending: Obstetrics & Gynecology | Admitting: Obstetrics & Gynecology

## 2016-12-05 ENCOUNTER — Encounter: Payer: BLUE CROSS/BLUE SHIELD | Admitting: Obstetrics & Gynecology

## 2016-12-05 ENCOUNTER — Encounter (HOSPITAL_COMMUNITY): Payer: Self-pay

## 2016-12-05 DIAGNOSIS — Z01812 Encounter for preprocedural laboratory examination: Secondary | ICD-10-CM | POA: Diagnosis not present

## 2016-12-05 HISTORY — DX: Other specified postprocedural states: R11.2

## 2016-12-05 HISTORY — DX: Other specified postprocedural states: Z98.890

## 2016-12-05 LAB — CBC
HCT: 37 % (ref 36.0–46.0)
Hemoglobin: 13.2 g/dL (ref 12.0–15.0)
MCH: 30 pg (ref 26.0–34.0)
MCHC: 35.7 g/dL (ref 30.0–36.0)
MCV: 84.1 fL (ref 78.0–100.0)
Platelets: 150 10*3/uL (ref 150–400)
RBC: 4.4 MIL/uL (ref 3.87–5.11)
RDW: 12.3 % (ref 11.5–15.5)
WBC: 5.1 10*3/uL (ref 4.0–10.5)

## 2016-12-05 LAB — TYPE AND SCREEN
ABO/RH(D): A POS
Antibody Screen: NEGATIVE

## 2016-12-05 LAB — ABO/RH: ABO/RH(D): A POS

## 2016-12-17 ENCOUNTER — Ambulatory Visit (HOSPITAL_COMMUNITY): Payer: BLUE CROSS/BLUE SHIELD | Admitting: Anesthesiology

## 2016-12-17 ENCOUNTER — Encounter (HOSPITAL_COMMUNITY)
Admission: RE | Disposition: A | Discharge status: Home / Self Care | Payer: Self-pay | Source: Ambulatory Visit | Attending: Obstetrics & Gynecology

## 2016-12-17 ENCOUNTER — Encounter (HOSPITAL_COMMUNITY): Payer: Self-pay | Admitting: Emergency Medicine

## 2016-12-17 ENCOUNTER — Observation Stay (HOSPITAL_COMMUNITY)
Admission: RE | Admit: 2016-12-17 | Discharge: 2016-12-18 | Disposition: A | Discharge status: Home / Self Care | Payer: BLUE CROSS/BLUE SHIELD | Source: Ambulatory Visit | Attending: Obstetrics & Gynecology | Admitting: Obstetrics & Gynecology

## 2016-12-17 DIAGNOSIS — F419 Anxiety disorder, unspecified: Secondary | ICD-10-CM | POA: Diagnosis not present

## 2016-12-17 DIAGNOSIS — Z79899 Other long term (current) drug therapy: Secondary | ICD-10-CM | POA: Insufficient documentation

## 2016-12-17 DIAGNOSIS — D252 Subserosal leiomyoma of uterus: Principal | ICD-10-CM | POA: Insufficient documentation

## 2016-12-17 DIAGNOSIS — Z90710 Acquired absence of both cervix and uterus: Secondary | ICD-10-CM | POA: Diagnosis present

## 2016-12-17 DIAGNOSIS — N939 Abnormal uterine and vaginal bleeding, unspecified: Secondary | ICD-10-CM | POA: Diagnosis not present

## 2016-12-17 DIAGNOSIS — R87610 Atypical squamous cells of undetermined significance on cytologic smear of cervix (ASC-US): Secondary | ICD-10-CM | POA: Diagnosis not present

## 2016-12-17 HISTORY — PX: VAGINAL HYSTERECTOMY: SHX2639

## 2016-12-17 LAB — PREGNANCY, URINE: Preg Test, Ur: NEGATIVE

## 2016-12-17 SURGERY — HYSTERECTOMY, VAGINAL
Anesthesia: Spinal | Site: Vagina | Laterality: Bilateral

## 2016-12-17 MED ORDER — PANTOPRAZOLE SODIUM 40 MG PO TBEC
40.0000 mg | DELAYED_RELEASE_TABLET | Freq: Every day | ORAL | Status: DC
  Filled 2016-12-17: qty 1

## 2016-12-17 MED ORDER — SENNOSIDES-DOCUSATE SODIUM 8.6-50 MG PO TABS: 1.0000 | ORAL_TABLET | Freq: Every evening | ORAL | Status: DC | PRN

## 2016-12-17 MED ORDER — MIDAZOLAM HCL 2 MG/2ML IJ SOLN
INTRAMUSCULAR | Status: AC
  Filled 2016-12-17: qty 2

## 2016-12-17 MED ORDER — ONDANSETRON HCL 4 MG/2ML IJ SOLN
INTRAMUSCULAR | Status: AC
  Filled 2016-12-17: qty 2

## 2016-12-17 MED ORDER — ZOLPIDEM TARTRATE 5 MG PO TABS: 5.0000 mg | ORAL_TABLET | Freq: Every evening | ORAL | Status: DC | PRN

## 2016-12-17 MED ORDER — NALBUPHINE HCL 10 MG/ML IJ SOLN
5.0000 mg | Freq: Once | INTRAMUSCULAR | Status: DC | PRN
Start: 2016-12-17 — End: 2016-12-18

## 2016-12-17 MED ORDER — LACTATED RINGERS IV SOLN: INTRAVENOUS | Status: DC

## 2016-12-17 MED ORDER — BUPIVACAINE-EPINEPHRINE 0.5% -1:200000 IJ SOLN
INTRAMUSCULAR | Status: DC | PRN
  Administered 2016-12-17: 29 mL

## 2016-12-17 MED ORDER — HYDROMORPHONE HCL 1 MG/ML IJ SOLN: 1.0000 mg | INTRAMUSCULAR | Status: DC | PRN

## 2016-12-17 MED ORDER — ONDANSETRON HCL 4 MG/2ML IJ SOLN
4.0000 mg | Freq: Four times a day (QID) | INTRAMUSCULAR | Status: DC | PRN
  Administered 2016-12-17: 4 mg via INTRAVENOUS
  Filled 2016-12-17: qty 2

## 2016-12-17 MED ORDER — DEXAMETHASONE SODIUM PHOSPHATE 4 MG/ML IJ SOLN
INTRAMUSCULAR | Status: AC
Start: 2016-12-17 — End: 2016-12-17
  Filled 2016-12-17: qty 1

## 2016-12-17 MED ORDER — LACTATED RINGERS IV SOLN
INTRAVENOUS | Status: DC
  Administered 2016-12-17 (×2): via INTRAVENOUS

## 2016-12-17 MED ORDER — NALBUPHINE HCL 10 MG/ML IJ SOLN: 5.0000 mg | INTRAMUSCULAR | Status: DC | PRN

## 2016-12-17 MED ORDER — ONDANSETRON HCL 4 MG/2ML IJ SOLN
INTRAMUSCULAR | Status: DC | PRN
  Administered 2016-12-17: 4 mg via INTRAVENOUS

## 2016-12-17 MED ORDER — PROPOFOL 500 MG/50ML IV EMUL
INTRAVENOUS | Status: DC | PRN
  Administered 2016-12-17: 25 ug/kg/min via INTRAVENOUS

## 2016-12-17 MED ORDER — DEXAMETHASONE SODIUM PHOSPHATE 10 MG/ML IJ SOLN
INTRAMUSCULAR | Status: DC | PRN
  Administered 2016-12-17: 10 mg via INTRAVENOUS

## 2016-12-17 MED ORDER — FENTANYL CITRATE (PF) 100 MCG/2ML IJ SOLN
INTRAMUSCULAR | Status: DC | PRN
  Administered 2016-12-17: 20 ug via INTRATHECAL
  Administered 2016-12-17: 50 ug via INTRAVENOUS
  Administered 2016-12-17: 25 ug via INTRAVENOUS
  Administered 2016-12-17: 30 ug via INTRAVENOUS
  Administered 2016-12-17: 25 ug via INTRAVENOUS

## 2016-12-17 MED ORDER — PROMETHAZINE HCL 25 MG/ML IJ SOLN
INTRAMUSCULAR | Status: AC
  Filled 2016-12-17: qty 1

## 2016-12-17 MED ORDER — KETOROLAC TROMETHAMINE 30 MG/ML IJ SOLN: 30.0000 mg | Freq: Once | INTRAMUSCULAR | Status: DC

## 2016-12-17 MED ORDER — CEFOTETAN DISODIUM-DEXTROSE 2-2.08 GM-% IV SOLR
INTRAVENOUS | Status: AC
  Filled 2016-12-17: qty 50

## 2016-12-17 MED ORDER — FENTANYL CITRATE (PF) 100 MCG/2ML IJ SOLN
INTRAMUSCULAR | Status: AC
  Filled 2016-12-17: qty 2

## 2016-12-17 MED ORDER — NALOXONE HCL 0.4 MG/ML IJ SOLN: 0.4000 mg | INTRAMUSCULAR | Status: DC | PRN

## 2016-12-17 MED ORDER — PROPOFOL 10 MG/ML IV BOLUS
INTRAVENOUS | Status: DC | PRN
  Administered 2016-12-17: 30 mg via INTRAVENOUS
  Administered 2016-12-17: 10 mg via INTRAVENOUS
  Administered 2016-12-17 (×6): 20 mg via INTRAVENOUS

## 2016-12-17 MED ORDER — MEPERIDINE HCL 25 MG/ML IJ SOLN: 6.2500 mg | INTRAMUSCULAR | Status: DC | PRN

## 2016-12-17 MED ORDER — FENTANYL CITRATE (PF) 250 MCG/5ML IJ SOLN
INTRAMUSCULAR | Status: AC
  Filled 2016-12-17: qty 5

## 2016-12-17 MED ORDER — BUPIVACAINE IN DEXTROSE 0.75-8.25 % IT SOLN
INTRATHECAL | Status: DC | PRN
  Administered 2016-12-17: 2 mL via INTRATHECAL

## 2016-12-17 MED ORDER — OXYCODONE-ACETAMINOPHEN 5-325 MG PO TABS: 1.0000 | ORAL_TABLET | ORAL | Status: DC | PRN

## 2016-12-17 MED ORDER — MORPHINE SULFATE (PF) 0.5 MG/ML IJ SOLN
INTRAMUSCULAR | Status: DC | PRN
  Administered 2016-12-17: .2 mg via INTRATHECAL

## 2016-12-17 MED ORDER — SCOPOLAMINE 1 MG/3DAYS TD PT72
MEDICATED_PATCH | TRANSDERMAL | Status: AC
  Administered 2016-12-17: 1.5 mg via TRANSDERMAL
  Filled 2016-12-17: qty 1

## 2016-12-17 MED ORDER — SCOPOLAMINE 1 MG/3DAYS TD PT72
1.0000 | MEDICATED_PATCH | Freq: Once | TRANSDERMAL | Status: DC
  Administered 2016-12-17: 1.5 mg via TRANSDERMAL

## 2016-12-17 MED ORDER — KETOROLAC TROMETHAMINE 30 MG/ML IJ SOLN: 30.0000 mg | Freq: Four times a day (QID) | INTRAMUSCULAR | Status: DC | PRN

## 2016-12-17 MED ORDER — LIDOCAINE HCL (CARDIAC) 20 MG/ML IV SOLN
INTRAVENOUS | Status: AC
  Filled 2016-12-17: qty 5

## 2016-12-17 MED ORDER — NEOSTIGMINE METHYLSULFATE 10 MG/10ML IV SOLN
INTRAVENOUS | Status: AC
  Filled 2016-12-17: qty 1

## 2016-12-17 MED ORDER — SODIUM CHLORIDE 0.9% FLUSH: 3.0000 mL | INTRAVENOUS | Status: DC | PRN

## 2016-12-17 MED ORDER — ALUM & MAG HYDROXIDE-SIMETH 200-200-20 MG/5ML PO SUSP: 30.0000 mL | ORAL | Status: DC | PRN

## 2016-12-17 MED ORDER — ONDANSETRON HCL 4 MG/2ML IJ SOLN: 4.0000 mg | Freq: Three times a day (TID) | INTRAMUSCULAR | Status: DC | PRN

## 2016-12-17 MED ORDER — SCOPOLAMINE 1 MG/3DAYS TD PT72: 1.0000 | MEDICATED_PATCH | Freq: Once | TRANSDERMAL | Status: DC

## 2016-12-17 MED ORDER — KETOROLAC TROMETHAMINE 30 MG/ML IJ SOLN
INTRAMUSCULAR | Status: DC | PRN
  Administered 2016-12-17: 30 mg via INTRAVENOUS

## 2016-12-17 MED ORDER — DEXTROSE 5 % IV SOLN: 1.0000 ug/kg/h | INTRAVENOUS | Status: DC | PRN

## 2016-12-17 MED ORDER — HYDROMORPHONE HCL 1 MG/ML IJ SOLN: 0.2500 mg | INTRAMUSCULAR | Status: DC | PRN

## 2016-12-17 MED ORDER — ROCURONIUM BROMIDE 100 MG/10ML IV SOLN
INTRAVENOUS | Status: AC
  Filled 2016-12-17: qty 1

## 2016-12-17 MED ORDER — DEXAMETHASONE SODIUM PHOSPHATE 10 MG/ML IJ SOLN
INTRAMUSCULAR | Status: AC
  Filled 2016-12-17: qty 1

## 2016-12-17 MED ORDER — PROPOFOL 10 MG/ML IV BOLUS
INTRAVENOUS | Status: AC
  Filled 2016-12-17: qty 20

## 2016-12-17 MED ORDER — PROMETHAZINE HCL 25 MG/ML IJ SOLN
6.2500 mg | INTRAMUSCULAR | Status: DC | PRN
  Administered 2016-12-17: 6.25 mg via INTRAVENOUS

## 2016-12-17 MED ORDER — MORPHINE SULFATE (PF) 0.5 MG/ML IJ SOLN
INTRAMUSCULAR | Status: AC
  Filled 2016-12-17: qty 10

## 2016-12-17 MED ORDER — KETOROLAC TROMETHAMINE 30 MG/ML IJ SOLN
INTRAMUSCULAR | Status: AC
  Filled 2016-12-17: qty 1

## 2016-12-17 MED ORDER — SIMETHICONE 80 MG PO CHEW: 80.0000 mg | CHEWABLE_TABLET | Freq: Four times a day (QID) | ORAL | Status: DC | PRN

## 2016-12-17 MED ORDER — CEFOTETAN DISODIUM-DEXTROSE 2-2.08 GM-% IV SOLR
2.0000 g | INTRAVENOUS | Status: AC
  Administered 2016-12-17: 2 g via INTRAVENOUS

## 2016-12-17 MED ORDER — KETOROLAC TROMETHAMINE 30 MG/ML IJ SOLN: 30.0000 mg | Freq: Once | INTRAMUSCULAR | Status: DC | PRN

## 2016-12-17 MED ORDER — ACETAMINOPHEN 500 MG PO TABS
1000.0000 mg | ORAL_TABLET | Freq: Once | ORAL | Status: AC
  Administered 2016-12-17: 1000 mg via ORAL
  Filled 2016-12-17: qty 2

## 2016-12-17 MED ORDER — ESTRADIOL 0.1 MG/GM VA CREA
TOPICAL_CREAM | VAGINAL | Status: AC
  Filled 2016-12-17: qty 42.5

## 2016-12-17 MED ORDER — IBUPROFEN 600 MG PO TABS
600.0000 mg | ORAL_TABLET | Freq: Four times a day (QID) | ORAL | Status: DC | PRN
  Administered 2016-12-18: 600 mg via ORAL
  Filled 2016-12-17: qty 1

## 2016-12-17 MED ORDER — MIDAZOLAM HCL 2 MG/2ML IJ SOLN
INTRAMUSCULAR | Status: DC | PRN
  Administered 2016-12-17 (×3): 1 mg via INTRAVENOUS

## 2016-12-17 MED ORDER — DIPHENHYDRAMINE HCL 25 MG PO CAPS: 25.0000 mg | ORAL_CAPSULE | ORAL | Status: DC | PRN

## 2016-12-17 MED ORDER — ACETAMINOPHEN 500 MG PO TABS
1000.0000 mg | ORAL_TABLET | Freq: Four times a day (QID) | ORAL | Status: DC
  Administered 2016-12-17 – 2016-12-18 (×2): 1000 mg via ORAL
  Filled 2016-12-17 (×2): qty 2

## 2016-12-17 MED ORDER — DIPHENHYDRAMINE HCL 50 MG/ML IJ SOLN: 12.5000 mg | INTRAMUSCULAR | Status: DC | PRN

## 2016-12-17 MED ORDER — MENTHOL 3 MG MT LOZG: 1.0000 | LOZENGE | OROMUCOSAL | Status: DC | PRN

## 2016-12-17 MED ORDER — ONDANSETRON HCL 4 MG PO TABS: 4.0000 mg | ORAL_TABLET | Freq: Four times a day (QID) | ORAL | Status: DC | PRN

## 2016-12-17 MED ORDER — BUPIVACAINE-EPINEPHRINE (PF) 0.5% -1:200000 IJ SOLN
INTRAMUSCULAR | Status: AC
  Filled 2016-12-17: qty 30

## 2016-12-17 MED ORDER — BISACODYL 10 MG RE SUPP: 10.0000 mg | Freq: Every day | RECTAL | Status: DC | PRN

## 2016-12-17 MED ORDER — PROPOFOL 500 MG/50ML IV EMUL
INTRAVENOUS | Status: AC
  Filled 2016-12-17: qty 50

## 2016-12-17 MED ORDER — DOCUSATE SODIUM 100 MG PO CAPS
100.0000 mg | ORAL_CAPSULE | Freq: Two times a day (BID) | ORAL | Status: DC
  Administered 2016-12-17 – 2016-12-18 (×2): 100 mg via ORAL
  Filled 2016-12-17 (×2): qty 1

## 2016-12-17 MED ORDER — LACTATED RINGERS IV SOLN
INTRAVENOUS | Status: DC
  Administered 2016-12-17 – 2016-12-18 (×2): via INTRAVENOUS

## 2016-12-17 MED ORDER — MAGNESIUM CITRATE PO SOLN: 1.0000 | Freq: Once | ORAL | Status: DC | PRN

## 2016-12-17 SURGICAL SUPPLY — 25 items
CANISTER SUCT 3000ML PPV (MISCELLANEOUS) ×2 IMPLANT
CLOTH BEACON ORANGE TIMEOUT ST (SAFETY) ×2 IMPLANT
CONT PATH 16OZ SNAP LID 3702 (MISCELLANEOUS) ×2 IMPLANT
DECANTER SPIKE VIAL GLASS SM (MISCELLANEOUS) ×2 IMPLANT
GAUZE PACKING 2X5 YD STRL (GAUZE/BANDAGES/DRESSINGS) IMPLANT
GLOVE BIOGEL PI IND STRL 6.5 (GLOVE) ×1 IMPLANT
GLOVE BIOGEL PI IND STRL 7.0 (GLOVE) ×2 IMPLANT
GLOVE BIOGEL PI INDICATOR 6.5 (GLOVE) ×1
GLOVE BIOGEL PI INDICATOR 7.0 (GLOVE) ×2
GLOVE ECLIPSE 7.0 STRL STRAW (GLOVE) ×2 IMPLANT
GOWN STRL REUS W/TWL LRG LVL3 (GOWN DISPOSABLE) ×8 IMPLANT
NEEDLE HYPO 22GX1.5 SAFETY (NEEDLE) IMPLANT
NEEDLE SPNL 22GX3.5 QUINCKE BK (NEEDLE) ×2 IMPLANT
NS IRRIG 1000ML POUR BTL (IV SOLUTION) ×2 IMPLANT
PACK TRENDGUARD 600 HYBRD PROC (MISCELLANEOUS) IMPLANT
PACK VAGINAL WOMENS (CUSTOM PROCEDURE TRAY) ×2 IMPLANT
PAD OB MATERNITY 4.3X12.25 (PERSONAL CARE ITEMS) ×2 IMPLANT
SUT VIC AB 0 CT1 18XCR BRD8 (SUTURE) ×3 IMPLANT
SUT VIC AB 0 CT1 27 (SUTURE) ×2
SUT VIC AB 0 CT1 27XBRD ANBCTR (SUTURE) ×2 IMPLANT
SUT VIC AB 0 CT1 8-18 (SUTURE) ×3
SUT VICRYL 0 TIES 12 18 (SUTURE) ×2 IMPLANT
TOWEL OR 17X24 6PK STRL BLUE (TOWEL DISPOSABLE) ×4 IMPLANT
TRAY FOLEY CATH SILVER 14FR (SET/KITS/TRAYS/PACK) ×2 IMPLANT
TRENDGUARD 600 HYBRID PROC PK (MISCELLANEOUS)

## 2016-12-17 NOTE — Progress Notes (Signed)
Anesthesia notified to clarify orders regarding spinal/duramorph and patient having narcotics. MD to review chart and call RN back.

## 2016-12-17 NOTE — Anesthesia Preprocedure Evaluation (Signed)
Anesthesia Evaluation  Patient identified by MRN, date of birth, ID band Patient awake    Reviewed: Allergy & Precautions, NPO status , Patient's Chart, lab work & pertinent test results  Airway Mallampati: I       Dental no notable dental hx. (+) Teeth Intact   Pulmonary neg pulmonary ROS,    Pulmonary exam normal breath sounds clear to auscultation       Cardiovascular negative cardio ROS Normal cardiovascular exam Rhythm:Regular Rate:Normal     Neuro/Psych    GI/Hepatic negative GI ROS, Neg liver ROS,   Endo/Other  negative endocrine ROS  Renal/GU negative Renal ROS  negative genitourinary   Musculoskeletal negative musculoskeletal ROS (+)   Abdominal Normal abdominal exam  (+)   Peds negative pediatric ROS (+)  Hematology negative hematology ROS (+)   Anesthesia Other Findings   Reproductive/Obstetrics negative OB ROS                             Anesthesia Physical Anesthesia Plan  ASA: II  Anesthesia Plan: Spinal   Post-op Pain Management:    Induction:   PONV Risk Score and Plan:   Airway Management Planned: Natural Airway, Nasal Cannula and Simple Face Mask  Additional Equipment:   Intra-op Plan:   Post-operative Plan:   Informed Consent: I have reviewed the patients History and Physical, chart, labs and discussed the procedure including the risks, benefits and alternatives for the proposed anesthesia with the patient or authorized representative who has indicated his/her understanding and acceptance.     Plan Discussed with: CRNA and Surgeon  Anesthesia Plan Comments:         Anesthesia Quick Evaluation

## 2016-12-17 NOTE — Anesthesia Procedure Notes (Signed)
Spinal  Start time: 12/17/2016 12:49 PM End time: 12/17/2016 12:52 PM Staffing Anesthesiologist: Lyn Hollingshead Performed: anesthesiologist  Preanesthetic Checklist Completed: patient identified, surgical consent, pre-op evaluation, timeout performed, IV checked, risks and benefits discussed and monitors and equipment checked Spinal Block Patient position: sitting Prep: site prepped and draped and DuraPrep Patient monitoring: heart rate, cardiac monitor, continuous pulse ox and blood pressure Approach: midline Location: L3-4 Injection technique: single-shot Needle Needle type: Pencan  Needle gauge: 24 G Needle length: 10 cm Needle insertion depth: 4 cm Assessment Sensory level: T6

## 2016-12-17 NOTE — H&P (Signed)
Preoperative History and Physical  Stephanie [name] is a 36 y.o. G1P1001 here for surgical management of AUB.   No significant preoperative concerns.  Proposed surgery: Total vaginal hysterectomy, bilateral salpingectomy (TVH, BS)  Past Medical History:  Diagnosis Date  . Anxiety    history of  . Headache(784.0)    SEVERE MIGRANES  . PONV (postoperative nausea and vomiting)    Past Surgical History:  Procedure Laterality Date  . KNEE ARTHROSCOPY      X 4 ON LEFT KNEE  . KNEE ARTHROSCOPY W/ OATS PROCEDURE      X1 ON LEFT KNEE  . WISDOM TOOTH EXTRACTION      ALL 4 REMOVE   OB History  Gravida Para Term Preterm AB Living  1 1 1  0 0 1  SAB TAB Ectopic Multiple Live Births  0 0 0 0 1    # Outcome Date GA Lbr Len/2nd Weight Sex Delivery Anes PTL Lv  1 Term 07/25/11 [redacted]w[redacted]d 05:32 / 00:50 8 lb 0.6 oz (3.646 kg) F Vag-Spont EPI  LIV    Patient denies any other pertinent gynecologic issues.   No current facility-administered medications on file prior to encounter.    Current Outpatient Prescriptions on File Prior to Encounter  Medication Sig Dispense Refill  . megestrol (MEGACE) 40 MG tablet Take 1 tablet (40 mg total) by mouth 2 (two) times daily. Can increase to two tablets twice a day in the event of heavy bleeding (Patient taking differently: Take 40 mg by mouth 2 (two) times daily as needed (for heavy bleeding). Can increase to two tablets twice a day in the event of heavy bleeding) 60 tablet 5  . norgestimate-ethinyl estradiol (SPRINTEC 28) 0.25-35 MG-MCG tablet Take 1 tablet by mouth daily. 28 tablet 16   No Known Allergies  Social History:   reports that she has never smoked. She has never used smokeless tobacco. She reports that she does not drink alcohol or use drugs.  Family History  Problem Relation Age of Onset  . Anxiety disorder Mother   . Alcohol abuse Father   . Kidney disease Father   . Heart disease Father   . Mental illness Sister   . Alcohol abuse  Maternal Aunt   . Heart disease Maternal Grandmother   . Mental illness Paternal Aunt     Review of Systems: Noncontributory  PHYSICAL EXAM: Blood pressure (!) 129/94, pulse 86, temperature 98.3 F (36.8 C), temperature source Oral, resp. rate 16, last menstrual period 12/10/2016, SpO2 100 %. CONSTITUTIONAL: Well-developed, well-nourished female in no acute distress.  HENT:  Normocephalic, atraumatic, External right and left ear normal. Oropharynx is clear and moist EYES: Conjunctivae and EOM are normal. Pupils are equal, round, and reactive to light. No scleral icterus.  NECK: Normal range of motion, supple, no masses SKIN: Skin is warm and dry. No rash noted. Not diaphoretic. No erythema. No pallor. NEUROLOGIC: Alert and oriented to person, place, and time. Normal reflexes, muscle tone coordination. No cranial nerve deficit noted. PSYCHIATRIC: Normal mood and affect. Normal behavior. Normal judgment and thought content. CARDIOVASCULAR: Normal heart rate noted, regular rhythm RESPIRATORY: Effort and breath sounds normal, no problems with respiration noted ABDOMEN: Soft, nontender, nondistended. PELVIC: Deferred MUSCULOSKELETAL: Normal range of motion. No edema and no tenderness. 2+ distal pulses.  Labs: Results for orders placed or performed during the hospital encounter of 12/05/16 (from the past 336 hour(s))  CBC   Collection Time: 12/05/16 11:00 AM  Result Value Ref Range  WBC 5.1 4.0 - 10.5 K/uL   RBC 4.40 3.87 - 5.11 MIL/uL   Hemoglobin 13.2 12.0 - 15.0 g/dL   HCT 37.0 36.0 - 46.0 %   MCV 84.1 78.0 - 100.0 fL   MCH 30.0 26.0 - 34.0 pg   MCHC 35.7 30.0 - 36.0 g/dL   RDW 12.3 11.5 - 15.5 %   Platelets 150 150 - 400 K/uL  Type and screen [place]   Collection Time: 12/05/16 11:00 AM  Result Value Ref Range   ABO/RH(D) A POS    Antibody Screen NEG    Sample Expiration 12/19/2016    Extend sample reason NO TRANSFUSIONS OR PREGNANCY IN THE PAST 3  MONTHS   ABO/Rh   Collection Time: 12/05/16 11:00 AM  Result Value Ref Range   ABO/RH(D) A POS     Imaging Studies: Result Date: 10/07/2016 CLINICAL DATA:  Abnormal uterine bleeding for 1 year. LMP 09/13/2016. EXAM: TRANSABDOMINAL AND TRANSVAGINAL ULTRASOUND OF PELVIS TECHNIQUE: Both transabdominal and transvaginal ultrasound examinations of the pelvis were performed. Transabdominal technique was performed for global imaging of the pelvis including uterus, ovaries, adnexal regions, and pelvic cul-de-sac. It was necessary to proceed with endovaginal exam following the transabdominal exam to visualize the uterus, endometrium, ovaries, adnexal region. COMPARISON:  07/23/2011 OB ultrasound FINDINGS: Uterus Measurements: 8.1 x 4.7 x 4.9 cm. No fibroids or other mass visualized. Endometrium Thickness: 4.0 mm.  No focal abnormality visualized. Right ovary Measurements: 3.3 x 1.0 x 1.9 cm. Normal appearance/no adnexal mass. Left ovary Measurements: The ovary is not visualized, either absent or obscured . No adnexal mass. Other findings No abnormal free fluid. IMPRESSION: 1. Normal appearance of the uterus/endometrium. If bleeding remains unresponsive to hormonal or medical therapy, sonohysterogram should be considered for focal lesion work-up. (Ref: Radiological Reasoning: Algorithmic Workup of Abnormal Vaginal Bleeding with Endovaginal Sonography and Sonohysterography. AJR 2008; [id number]:[id number]) 2. Normal appearance of the right ovary ; nonvisualized left ovary. Electronically Signed   By: Nolon Nations M.D.   On: 10/07/2016 16:17   Assessment: Patient Active Problem List   Diagnosis Date Noted  . Abnormal uterine bleeding (AUB) 10/15/2016  . Atypical squamous cells of undetermined significance (ASCUS) on Papanicolaou smear of cervix 11/27/2015  . Anxiety disorder 04/12/2015    Plan: Patient will undergo surgical management with TVH, BS.   The risks of surgery were discussed in detail with the patient  including but not limited to: bleeding which may require transfusion or reoperation; infection which may require antibiotics; injury to surrounding organs which may involve bowel, bladder, ureters or other surrounding organs ; need for additional procedures including laparoscopy or laparotomy; thromboembolic phenomenon, surgical site problems and other postoperative/anesthesia complications. Likelihood of success in alleviating the patient's condition was discussed. Patient was also advised that she will remain in house for 1 night; and expected recovery time after a hysterectomy is 4-6weeks. Routine postoperative instructions will be reviewed with the patient and her family in detail after surgery.  The patient concurred with the proposed plan, giving informed written consent for the surgery.  Patient has been NPO since last night and she will remain NPO for procedure.  Anesthesia and OR aware.  Preoperative prophylactic antibiotics and SCDs ordered on call to the OR.  To OR when ready.  Verita Schneiders, M.D. 12/17/2016 11:51 AM

## 2016-12-17 NOTE — Transfer of Care (Signed)
Immediate Anesthesia Transfer of Care Note  Patient: Stephanie [name]  Procedure(s) Performed: HYSTERECTOMY VAGINAL WITH BILATERAL SALPINGECTOMY (Bilateral Vagina )  Patient Location: PACU  Anesthesia Type:Spinal  Level of Consciousness: sedated  Airway & Oxygen Therapy: Patient Spontanous Breathing  Post-op Assessment: Report given to RN and Post -op Vital signs reviewed and stable  Post vital signs: Reviewed and stable  Last Vitals:  Vitals:   12/17/16 1127  BP: (!) 129/94  Pulse: 86  Resp: 16  Temp: 36.8 C  SpO2: 100%    Last Pain:  Vitals:   12/17/16 1127  TempSrc: Oral      Patients Stated Pain Goal: 3 (39/53/20 2334)  Complications: No apparent anesthesia complications

## 2016-12-17 NOTE — Op Note (Addendum)
Stephanie [name] PROCEDURE DATE: 12/17/2016  PREOPERATIVE DIAGNOSIS:  Abnormal uterine bleeding POSTOPERATIVE DIAGNOSIS:  Abnormal uterine bleeding SURGEON:   Verita Schneiders, M.D. Franchot Gallo, M.D. OPERATION:  Total Vaginal Hysterectomy, Bilateral Salpingectomy. ANESTHESIA:  Spinal, IV sedation  INDICATIONS: The patient is a 36 y.o. G1P1001 with history of symptomatic uterine fibroids/menorrhagia. The patient made a decision to undergo definite surgical treatment. On the preoperative visit, the risks, benefits, indications, and alternatives of the procedure were reviewed with the patient.  On the day of surgery, the risks of surgery were again discussed with the patient including but not limited to: bleeding which may require transfusion or reoperation; infection which may require antibiotics; injury to bowel, bladder, ureters or other surrounding organs; need for additional procedures; thromboembolic phenomenon, incisional problems and other postoperative/anesthesia complications. Written informed consent was obtained.    OPERATIVE FINDINGS: A 8 week size uterus with normal tubes and ovaries bilaterally.  ESTIMATED BLOOD LOSS: 250 ml FLUIDS:  1300 ml of Lactated Ringers URINE OUTPUT:  200 ml of clear yellow urine. SPECIMENS:  Uterus, cervix and fallopian tubes sent to pathology COMPLICATIONS:  None immediate.  DESCRIPTION OF PROCEDURE:  The patient received intravenous antibiotics and had sequential compression devices applied to her lower extremities while in the preoperative area.  She was then taken to the operating room where general anesthesia was administered and was found to be adequate.  She was placed in the dorsal lithotomy position, and was prepped and draped in a sterile manner.  A Foley catheter was inserted into her bladder and attached to constant drainage. After an adequate timeout was performed, attention was turned to her pelvis.  A weighted speculum was  then placed in the vagina, and the anterior and posterior lips of the cervix were grasped bilaterally with tenaculums.  The cervix was then injected circumferentially with 0.5% Marcaine with epinephrine solution to maintain hemostasis.  The cervix was then circumferentially incised, and the bladder was dissected off the pubocervical fascia anteriorly without complication.  The anterior cul-de-sac was then entered sharply without difficulty and a retractor was placed.  The same procedure was performed posteriorly and the posterior cul-de-sac was entered sharply without difficulty.  A long weighted speculum was inserted into the posterior cul-de-sac.  The Heaney clamp was then used to clamp the uterosacral ligaments on either side.  They were then cut and sutured ligated with 0 Vicryl, and the ligated uterosacral ligaments were transfixed to the ipsilateral vaginal epithelium to further support the vagina and provide hemostasis. Of note, all sutures used in this case were 0 Vicryl unless otherwise noted.   The cardinal ligaments were then clamped, cut and ligated. The uterine vessels and broad ligaments were then serially clamped with the Heaney clamps, cut, and suture ligated on both sides.  Excellent hemostasis was noted at this point.  The uterus was then delivered via the posterior cul-de-sac, and the cornua were clamped with the Heaney clamps, transected, and the uterus was delivered and sent to pathology. These pedicles were then suture ligated to ensure hemostasis. Attention was turned to bilateral fallopian tubes which were clamped, cut and suture ligated allowing for bilateral salpingectomy.  After completion of the hysterectomy, all pedicles from the uterosacral ligament to the cornua were examined hemostasis was confirmed.  The vaginal cuff was then closed with a in a running locked fashion with care given to incorporate the uterosacral pedicles bilaterally.  All instruments were then removed from the  pelvis.  The patient tolerated the procedure  well.  All instruments, needles, and sponge counts were correct x 3. The patient was taken to the recovery room in stable condition.      Verita Schneiders, MD, [place] Attending [place], [facility]

## 2016-12-18 ENCOUNTER — Encounter (HOSPITAL_COMMUNITY): Payer: Self-pay | Admitting: Obstetrics & Gynecology

## 2016-12-18 DIAGNOSIS — D252 Subserosal leiomyoma of uterus: Secondary | ICD-10-CM | POA: Diagnosis not present

## 2016-12-18 DIAGNOSIS — Z79899 Other long term (current) drug therapy: Secondary | ICD-10-CM | POA: Diagnosis not present

## 2016-12-18 DIAGNOSIS — F419 Anxiety disorder, unspecified: Secondary | ICD-10-CM | POA: Diagnosis not present

## 2016-12-18 DIAGNOSIS — N939 Abnormal uterine and vaginal bleeding, unspecified: Secondary | ICD-10-CM | POA: Diagnosis not present

## 2016-12-18 LAB — CBC
HCT: 33.6 % — ABNORMAL LOW (ref 36.0–46.0)
Hemoglobin: 12 g/dL (ref 12.0–15.0)
MCH: 30.3 pg (ref 26.0–34.0)
MCHC: 35.7 g/dL (ref 30.0–36.0)
MCV: 84.8 fL (ref 78.0–100.0)
Platelets: 165 10*3/uL (ref 150–400)
RBC: 3.96 MIL/uL (ref 3.87–5.11)
RDW: 12.3 % (ref 11.5–15.5)
WBC: 8.7 10*3/uL (ref 4.0–10.5)

## 2016-12-18 MED ORDER — OXYCODONE-ACETAMINOPHEN 5-325 MG PO TABS: 1.0000 | ORAL_TABLET | ORAL | 0 refills | Status: DC | PRN

## 2016-12-18 MED ORDER — DOCUSATE SODIUM 100 MG PO CAPS: 100.0000 mg | ORAL_CAPSULE | Freq: Every day | ORAL | 2 refills | Status: DC | PRN

## 2016-12-18 MED ORDER — PROMETHAZINE HCL 25 MG PO TABS: 25.0000 mg | ORAL_TABLET | Freq: Four times a day (QID) | ORAL | 2 refills | Status: DC | PRN

## 2016-12-18 MED ORDER — IBUPROFEN 600 MG PO TABS: 600.0000 mg | ORAL_TABLET | Freq: Four times a day (QID) | ORAL | 2 refills | Status: DC | PRN

## 2016-12-18 NOTE — Progress Notes (Signed)
Discharge teaching complete with pt. Pt understood all information and did not have any questions. Pt discharged home with family.  

## 2016-12-18 NOTE — Anesthesia Postprocedure Evaluation (Signed)
Anesthesia Post Note  Patient: Stephanie [name]  Procedure(s) Performed: HYSTERECTOMY VAGINAL WITH BILATERAL SALPINGECTOMY (Bilateral Vagina )     Patient location during evaluation: PACU Anesthesia Type: Spinal Level of consciousness: awake Pain management: pain level controlled Vital Signs Assessment: post-procedure vital signs reviewed and stable Respiratory status: spontaneous breathing Cardiovascular status: stable Postop Assessment: no headache, spinal receding, patient able to bend at knees and no apparent nausea or vomiting Anesthetic complications: no    Last Vitals:  Vitals:   12/17/16 2335 12/18/16 0400  BP: 109/69 101/69  Pulse:  61  Resp:  16  Temp: 36.7 C 37 C  SpO2: 100% 100%    Last Pain:  Vitals:   12/18/16 0648  TempSrc:   PainSc: 3    Pain Goal: Patients Stated Pain Goal: 3 (12/18/16 0604)               Starlyn Droge JR,JOHN Mateo Flow

## 2016-12-18 NOTE — Discharge Instructions (Signed)
Vaginal Hysterectomy, Care After °Refer to this sheet in the next few weeks. These instructions provide you with information about caring for yourself after your procedure. Your health care provider may also give you more specific instructions. Your treatment has been planned according to current medical practices, but problems sometimes occur. Call your health care provider if you have any problems or questions after your procedure. °What can I expect after the procedure? °After the procedure, it is common to have: °· Pain. °· Soreness and numbness in your incision areas. °· Vaginal bleeding and discharge. °· Constipation. °· Temporary problems emptying the bladder. °· Feelings of sadness or other emotions. ° °Follow these instructions at home: °Medicines °· Take over-the-counter and prescription medicines only as told by your health care provider. °· If you were prescribed an antibiotic medicine, take it as told by your health care provider. Do not stop taking the antibiotic even if you start to feel better. °· Do not drive or operate heavy machinery while taking prescription pain medicine. °Activity °· Return to your normal activities as told by your health care provider. Ask your health care provider what activities are safe for you. °· Get regular exercise as told by your health care provider. You may be told to take short walks every day and go farther each time. °· Do not lift anything that is heavier than 10 lb (4.5 kg). °General instructions ° °· Do not put anything in your vagina for 6 weeks after your surgery or as told by your health care provider. This includes tampons and douches. °· Do not have sex until your health care provider says you can. °· Do not take baths, swim, or use a hot tub until your health care provider approves. °· Drink enough fluid to keep your urine clear or pale yellow. °· Do not drive for 24 hours if you were given a sedative. °· Keep all follow-up visits as told by your health  care provider. This is important. °Contact a health care provider if: °· Your pain medicine is not helping. °· You have a fever. °· You have redness, swelling, or pain at your incision site. °· You have blood, pus, or a bad-smelling discharge from your vagina. °· You continue to have difficulty urinating. °Get help right away if: °· You have severe abdominal or back pain. °· You have heavy bleeding from your vagina. °· You have chest pain or shortness of breath. °This information is not intended to replace advice given to you by your health care provider. Make sure you discuss any questions you have with your health care provider. °Document Released: 06/26/2015 Document Revised: 08/10/2015 Document Reviewed: 03/19/2015 °Elsevier Interactive Patient Education © 2018 [employer] Inc. ° °

## 2016-12-18 NOTE — Discharge Summary (Signed)
Gynecology Physician Postoperative Discharge Summary  Patient ID: Stephanie [name] MRN: [id number] DOB/AGE: July 09, 1980 36 y.o.  Admit Date: 12/17/2016 Discharge Date: 12/18/2016  Preoperative Diagnoses: Abnormal uterine bleeding  Procedures: Procedure(s) (LRB): HYSTERECTOMY VAGINAL WITH BILATERAL SALPINGECTOMY (Bilateral)  Hospital Course:  Stephanie [name] is a 36 y.o. G1P1001  admitted for scheduled surgery.  She underwent the procedures as mentioned above, her operation was uncomplicated. For further details about surgery, please refer to the operative report. Patient had an uncomplicated postoperative course. By time of discharge on POD#1, her pain was controlled on oral pain medications; she was ambulating, voiding without difficulty, tolerating regular diet and passing flatus. She was deemed stable for discharge to home.   Significant Labs: CBC Latest Ref Rng & Units 12/18/2016 12/05/2016 10/01/2016  WBC 4.0 - 10.5 K/uL 8.7 5.1 3.8  Hemoglobin 12.0 - 15.0 g/dL 12.0 13.2 14.5  Hematocrit 36.0 - 46.0 % 33.6(L) 37.0 42.4  Platelets 150 - 400 K/uL 165 150 186    Discharge Exam: Blood pressure 111/74, pulse 68, temperature 98.6 F (37 C), temperature source Oral, resp. rate 16, height 5\' 3"  (1.6 m), weight 143 lb (64.9 kg), last menstrual period 12/10/2016, SpO2 100 %. General appearance: alert and no distress  Resp: clear to auscultation bilaterally  Cardio: regular rate and rhythm  GI: soft, non-tender; bowel sounds normal; no masses, no organomegaly.  Pelvic: scant blood on pad  Extremities: extremities normal, atraumatic, no cyanosis or edema and Homans sign is negative, no sign of DVT  Discharged Condition: Stable  Disposition: 01-Home or Self Care   Allergies as of 12/18/2016   No Known Allergies     Medication List    TAKE these medications   docusate sodium 100 MG capsule Commonly known as:  COLACE Take 1 capsule (100 mg total) by mouth daily as  needed for mild constipation.   ibuprofen 600 MG tablet Commonly known as:  ADVIL,MOTRIN Take 1 tablet (600 mg total) by mouth every 6 (six) hours as needed (mild pain).   oxyCODONE-acetaminophen 5-325 MG tablet Commonly known as:  PERCOCET/ROXICET Take 1-2 tablets by mouth every 4 (four) hours as needed for severe pain (moderate to severe pain (when tolerating fluids)).   promethazine 25 MG tablet Commonly known as:  PHENERGAN Take 1 tablet (25 mg total) by mouth every 6 (six) hours as needed for nausea or vomiting.      Follow-up Information    Donivin Wirt, Sallyanne Havers, MD Follow up on 01/07/2017.   Specialty:  Obstetrics and Gynecology Why:  10 am for postoperative appointment as scheduled Contact information: [place] [address] [phone]           Signed:  Verita Schneiders, MD, [place] Attending [place], [facility]

## 2017-01-07 ENCOUNTER — Encounter: Payer: BLUE CROSS/BLUE SHIELD | Admitting: Obstetrics & Gynecology

## 2017-01-09 ENCOUNTER — Other Ambulatory Visit: Payer: Self-pay | Admitting: Family Medicine

## 2017-01-09 DIAGNOSIS — Z3041 Encounter for surveillance of contraceptive pills: Secondary | ICD-10-CM

## 2017-01-28 ENCOUNTER — Encounter: Payer: Self-pay | Admitting: Radiology

## 2017-01-28 ENCOUNTER — Ambulatory Visit (INDEPENDENT_AMBULATORY_CARE_PROVIDER_SITE_OTHER): Payer: BLUE CROSS/BLUE SHIELD | Admitting: Obstetrics & Gynecology

## 2017-01-28 VITALS — BP 130/86 | HR 80 | Wt 212.6 lb

## 2017-01-28 DIAGNOSIS — Z90710 Acquired absence of both cervix and uterus: Secondary | ICD-10-CM

## 2017-01-28 DIAGNOSIS — Z09 Encounter for follow-up examination after completed treatment for conditions other than malignant neoplasm: Secondary | ICD-10-CM

## 2017-01-28 NOTE — Progress Notes (Signed)
Subjective:     Stephanie [name] is a 36 y.o.G1P1001 female who presents to the clinic 6 weeks status post vaginal hysterectomy and bilateral salpingectomy for abnormal uterine bleeding. Eating a regular diet without difficulty. Bowel movements are normal. The patient is not having any pain.  The following portions of the patient's history were reviewed and updated as appropriate: allergies, current medications, past family history, past medical history, past social history, past surgical history and problem list.  Review of Systems Pertinent items noted in HPI and remainder of comprehensive ROS otherwise negative.    Objective:    BP 130/86   Pulse 80   Wt 212 lb 9.6 oz (96.4 kg)   BMI 37.66 kg/m  General:  alert and no distress  Abdomen: soft, bowel sounds active, non-tender  Pelvic:   Well-healing vaginal cuff with some sutures still visible.  Small amount of granulation tissue in right side of incision, treated with silver nitrate.    12/17/2016 Surgical Diagnosis Uterus, cervix and bilateral fallopian tubes - CERVIX: ESSENTIALLY UNREMARKABLE. - ENDOMETRIUM: BENIGN SECRETORY TYPE ENDOMETRIUM. - MYOMETRIUM: UNREMARKABLE. - SEROSA: SUBSEROSAL LEIOMYOMATA. - BILATERAL FALLOPIAN TUBES: UNREMARKABLE.  Assessment:    Doing well postoperatively. Operative findings again reviewed. Pathology report discussed.    Plan:   1. Continue any current medications. 2. Wound care discussed. 3. Activity restrictions: pelvic rest for two more weeks 4. Anticipated return to work: not applicable. 5. Follow up as needed.   Verita Schneiders, MD, [place] Attending [place], [facility] for [employer], [place]

## 2017-04-03 ENCOUNTER — Ambulatory Visit (INDEPENDENT_AMBULATORY_CARE_PROVIDER_SITE_OTHER): Payer: BLUE CROSS/BLUE SHIELD | Admitting: Obstetrics & Gynecology

## 2017-04-03 ENCOUNTER — Ambulatory Visit: Payer: BLUE CROSS/BLUE SHIELD | Admitting: Obstetrics & Gynecology

## 2017-04-03 VITALS — BP 128/67 | HR 72 | Wt 133.6 lb

## 2017-04-03 DIAGNOSIS — N941 Unspecified dyspareunia: Secondary | ICD-10-CM

## 2017-04-03 DIAGNOSIS — Z90710 Acquired absence of both cervix and uterus: Secondary | ICD-10-CM

## 2017-04-03 NOTE — Patient Instructions (Signed)
Return to clinic for any scheduled appointments or for any gynecologic concerns as needed.   

## 2017-04-03 NOTE — Progress Notes (Signed)
   GYNECOLOGY OFFICE VISIT NOTE  History:  37 y.o. G1P1001 s/p TVH on 12/17/16 here today for report of occasional pain during intercourse, mostly on left side of upper vagina during penetration. Pain goes away after a few minutes. Occurs in certain positions. She denies any abnormal vaginal discharge, bleeding,nausea, vomiting, dysuria or other concerns.   Past Medical History:  Diagnosis Date  . Anxiety    history of  . Headache(784.0)    SEVERE MIGRANES  . PONV (postoperative nausea and vomiting)     Past Surgical History:  Procedure Laterality Date  . KNEE ARTHROSCOPY      X 4 ON LEFT KNEE  . KNEE ARTHROSCOPY W/ OATS PROCEDURE      X1 ON LEFT KNEE  . VAGINAL HYSTERECTOMY Bilateral 12/17/2016   Procedure: HYSTERECTOMY VAGINAL WITH BILATERAL SALPINGECTOMY;  Surgeon: Osborne Oman, MD;  Location: [facility];  Service: Gynecology;  Laterality: Bilateral;  . WISDOM TOOTH EXTRACTION      ALL 4 REMOVE    The following portions of the patient's history were reviewed and updated as appropriate: allergies, current medications, past family history, past medical history, past social history, past surgical history and problem list.    Review of Systems:  Pertinent items noted in HPI and remainder of comprehensive ROS otherwise negative.  Objective:  Physical Exam BP 128/67   Pulse 72   Wt 133 lb 9.6 oz (60.6 kg)   BMI 23.67 kg/m  CONSTITUTIONAL: Well-developed, well-nourished female in no acute distress.  HENT:  Normocephalic, atraumatic. External right and left ear normal. Oropharynx is clear and moist EYES: Conjunctivae and EOM are normal. Pupils are equal, round, and reactive to light. No scleral icterus.  NECK: Normal range of motion, supple, no masses SKIN: Skin is warm and dry. No rash noted. Not diaphoretic. No erythema. No pallor. NEUROLOGIC: Alert and oriented to person, place, and time. Normal reflexes, muscle tone coordination. No cranial nerve deficit  noted. PSYCHIATRIC: Normal mood and affect. Normal behavior. Normal judgment and thought content. CARDIOVASCULAR: Normal heart rate noted RESPIRATORY: Effort and breath sounds normal, no problems with respiration noted ABDOMEN: Soft, no distention noted.   PELVIC: Normal appearing external genitalia; normal appearing vaginal mucosa and well-healed vaginal cuff.  Adequately lubricated.  No abnormal discharge noted.  Mild tenderness on left side of vaginal cuff. No palpable masses, no adnexal masses or tenderness.  MUSCULOSKELETAL: Normal range of motion. No edema noted.  Labs and Imaging No results found.  Assessment & Plan:  1. Female dyspareunia 2. S/P vaginal hysterectomy No obvious etiology seen on exam, likely nerve sensitivity vs scar tissue effect.  Patient reassured.  Told it would likely resolve in time.  Return if symptoms worsen or fail to improve. Routine preventative health maintenance measures emphasized. Please refer to After Visit Summary for other counseling recommendations  Total face-to-face time with patient: 10 minutes.  Over 50% of encounter was spent on counseling and coordination of care.   Verita Schneiders, MD, [place] for [employer], [place]

## 2017-04-24 ENCOUNTER — Ambulatory Visit (INDEPENDENT_AMBULATORY_CARE_PROVIDER_SITE_OTHER): Payer: BLUE CROSS/BLUE SHIELD | Admitting: Vascular Surgery

## 2017-04-24 ENCOUNTER — Encounter (INDEPENDENT_AMBULATORY_CARE_PROVIDER_SITE_OTHER): Payer: Self-pay | Admitting: Vascular Surgery

## 2017-04-24 DIAGNOSIS — M7989 Other specified soft tissue disorders: Secondary | ICD-10-CM

## 2017-04-24 DIAGNOSIS — I872 Venous insufficiency (chronic) (peripheral): Secondary | ICD-10-CM

## 2017-04-27 ENCOUNTER — Encounter (INDEPENDENT_AMBULATORY_CARE_PROVIDER_SITE_OTHER): Payer: Self-pay | Admitting: Vascular Surgery

## 2017-04-27 DIAGNOSIS — M7989 Other specified soft tissue disorders: Secondary | ICD-10-CM | POA: Insufficient documentation

## 2017-04-27 DIAGNOSIS — I872 Venous insufficiency (chronic) (peripheral): Secondary | ICD-10-CM | POA: Insufficient documentation

## 2017-04-27 NOTE — Progress Notes (Signed)
MRN : [id number]  Stephanie [name] is a 37 y.o. ([birth year]) female who presents with chief complaint of  Chief Complaint  Patient presents with  . New Patient (Initial Visit)    Bilateral LE redness  .  History of Present Illness: Patient is seen for evaluation of leg swelling. The patient first noticed the swelling remotely but is now concerned because of a significant increase in the overall edema. The swelling is associated with pain and discoloration. The patient notes that in the morning the legs are significantly improved but they steadily worsened throughout the course of the day. Elevation makes the legs better, dependency makes them much worse.   There is no history of ulcerations associated with the swelling.   The patient denies any recent changes in their medications.  The patient has not been wearing graduated compression.  The patient has no had any past angiography, interventions or vascular surgery.  The patient denies a history of DVT or PE. There is no prior history of phlebitis. There is no history of primary lymphedema.  There is no history of radiation treatment to the groin or pelvis No history of malignancies. No history of trauma or groin or pelvic surgery. No history of foreign travel or parasitic infections area   No outpatient medications have been marked as taking for the 04/24/17 encounter (Office Visit) with Delana Meyer, Dolores Lory, MD.    Past Medical History:  Diagnosis Date  . Anxiety    history of  . Headache(784.0)    SEVERE MIGRANES  . PONV (postoperative nausea and vomiting)     Past Surgical History:  Procedure Laterality Date  . KNEE ARTHROSCOPY      X 4 ON LEFT KNEE  . KNEE ARTHROSCOPY W/ OATS PROCEDURE      X1 ON LEFT KNEE  . VAGINAL HYSTERECTOMY Bilateral 12/17/2016   Procedure: HYSTERECTOMY VAGINAL WITH BILATERAL SALPINGECTOMY;  Surgeon: Osborne Oman, MD;  Location: [facility];  Service: Gynecology;  Laterality:  Bilateral;  . WISDOM TOOTH EXTRACTION      ALL 4 REMOVE    Social History Social History   Tobacco Use  . Smoking status: Never Smoker  . Smokeless tobacco: Never Used  Substance Use Topics  . Alcohol use: No  . Drug use: No    Family History Family History  Problem Relation Age of Onset  . Anxiety disorder Mother   . Alcohol abuse Father   . Kidney disease Father   . Heart disease Father   . Mental illness Sister   . Alcohol abuse Maternal Aunt   . Heart disease Maternal Grandmother   . Mental illness Paternal Aunt   No family history of bleeding/clotting disorders, porphyria or autoimmune disease   No Known Allergies   REVIEW OF SYSTEMS (Negative unless checked)  Constitutional: [] Weight loss  [] Fever  [] Chills Cardiac: [] Chest pain   [] Chest pressure   [] Palpitations   [] Shortness of breath when laying flat   [] Shortness of breath with exertion. Vascular:  [] Pain in legs with walking   [] Pain in legs at rest  [] History of DVT   [] Phlebitis   [x] Swelling in legs   [] Varicose veins   [] Non-healing ulcers Pulmonary:   [] Uses home oxygen   [] Productive cough   [] Hemoptysis   [] Wheeze  [] COPD   [] Asthma Neurologic:  [] Dizziness   [] Seizures   [] History of stroke   [] History of TIA  [] Aphasia   [] Vissual changes   [] Weakness or numbness in arm   []   Weakness or numbness in leg Musculoskeletal:   [] Joint swelling   [] Joint pain   [] Low back pain Hematologic:  [] Easy bruising  [] Easy bleeding   [] Hypercoagulable state   [] Anemic Gastrointestinal:  [] Diarrhea   [] Vomiting  [] Gastroesophageal reflux/heartburn   [] Difficulty swallowing. Genitourinary:  [] Chronic kidney disease   [] Difficult urination  [] Frequent urination   [] Blood in urine Skin:  [] Rashes   [] Ulcers  Psychological:  [] History of anxiety   []  History of major depression.  Physical Examination  Vitals:   04/24/17 1307  BP: 128/89  Pulse: 93  Resp: 14  Weight: 134 lb (60.8 kg)  Height: 5\' 3"  (1.6 m)    Body mass index is 23.74 kg/m. Gen: WD/WN, NAD Head: [place]/AT, No temporalis wasting.  Ear/Nose/Throat: Hearing grossly intact, nares w/o erythema or drainage, poor dentition Eyes: PER, EOMI, sclera nonicteric.  Neck: Supple, no masses.  No bruit or JVD.  Pulmonary:  Good air movement, clear to auscultation bilaterally, no use of accessory muscles.  Cardiac: RRR, normal S1, S2, no Murmurs. Vascular: scattered varicosities present bilaterally.  Mild venous stasis changes to the legs bilaterally.  2+ soft pitting edema; mild cyanosis of the toes bilaterally  Vessel Right Left  Radial Palpable Palpable  PT Palpable Palpable  DP Palpable Palpable   Gastrointestinal: soft, non-distended. No guarding/no peritoneal signs.  Musculoskeletal: M/S 5/5 throughout.  No deformity or atrophy.  Neurologic: CN 2-12 intact. Pain and light touch intact in extremities.  Symmetrical.  Speech is fluent. Motor exam as listed above. Psychiatric: Judgment intact, Mood & affect appropriate for pt's clinical situation. Dermatologic: No rashes or ulcers noted.  No changes consistent with cellulitis. Lymph : No Cervical lymphadenopathy, no lichenification or skin changes of chronic lymphedema.  CBC Lab Results  Component Value Date   WBC 8.7 12/18/2016   HGB 12.0 12/18/2016   HCT 33.6 (L) 12/18/2016   MCV 84.8 12/18/2016   PLT 165 12/18/2016    BMET    Component Value Date/Time   NA 139 11/23/2015 0941   K 3.9 11/23/2015 0941   CL 106 11/23/2015 0941   CO2 27 11/23/2015 0941   GLUCOSE 80 11/23/2015 0941   BUN 9 11/23/2015 0941   CREATININE 0.67 11/23/2015 0941   CALCIUM 8.9 11/23/2015 0941   GFRNONAA >90 07/24/2011 1941   GFRAA >90 07/24/2011 1941   CrCl cannot be calculated (Patient's most recent lab result is older than the maximum 21 days allowed.).  COAG No results found for: INR, PROTIME  Radiology No results found.  Assessment/Plan 1. Chronic venous insufficiency No surgery or  intervention at this point in time.  I have reviewed my discussion with the patient regarding venous insufficiency and why it causes symptoms. I have discussed with the patient the chronic skin changes that accompany venous insufficiency and the long term sequela such as ulceration. Patient will contnue wearing graduated compression stockings on a daily basis, as this has provided excellent control of his edema. The patient will put the stockings on first thing in the morning and removing them in the evening. The patient is reminded not to sleep in the stockings.  In addition, behavioral modification including elevation during the day will be initiated. Exercise is strongly encouraged.  Given the patient's good control and lack of any problems regarding the venous insufficiency and lymphedema a lymph pump in not need at this time.  The patient will follow up with me PRN should anything change.  The patient voices agreement with this plan.  2. Swelling of limb See #1    Hortencia Pilar, MD  04/27/2017 10:28 AM

## 2017-05-19 ENCOUNTER — Encounter (INDEPENDENT_AMBULATORY_CARE_PROVIDER_SITE_OTHER): Payer: BLUE CROSS/BLUE SHIELD

## 2017-05-19 ENCOUNTER — Ambulatory Visit (INDEPENDENT_AMBULATORY_CARE_PROVIDER_SITE_OTHER): Payer: BLUE CROSS/BLUE SHIELD | Admitting: Vascular Surgery

## 2017-05-30 ENCOUNTER — Other Ambulatory Visit (INDEPENDENT_AMBULATORY_CARE_PROVIDER_SITE_OTHER): Payer: Self-pay | Admitting: Vascular Surgery

## 2017-05-30 DIAGNOSIS — M7989 Other specified soft tissue disorders: Secondary | ICD-10-CM

## 2017-06-02 ENCOUNTER — Ambulatory Visit (INDEPENDENT_AMBULATORY_CARE_PROVIDER_SITE_OTHER): Payer: BLUE CROSS/BLUE SHIELD

## 2017-06-02 ENCOUNTER — Encounter (INDEPENDENT_AMBULATORY_CARE_PROVIDER_SITE_OTHER): Payer: Self-pay | Admitting: Vascular Surgery

## 2017-06-02 ENCOUNTER — Ambulatory Visit (INDEPENDENT_AMBULATORY_CARE_PROVIDER_SITE_OTHER): Payer: BLUE CROSS/BLUE SHIELD | Admitting: Vascular Surgery

## 2017-06-02 VITALS — BP 127/84 | HR 84 | Resp 17 | Wt 135.4 lb

## 2017-06-02 DIAGNOSIS — M7989 Other specified soft tissue disorders: Secondary | ICD-10-CM

## 2017-06-02 DIAGNOSIS — R600 Localized edema: Secondary | ICD-10-CM | POA: Diagnosis not present

## 2017-06-02 DIAGNOSIS — I872 Venous insufficiency (chronic) (peripheral): Secondary | ICD-10-CM

## 2017-06-02 NOTE — Progress Notes (Signed)
MRN : [id number]  Stephanie [name] is a 37 y.o. (1981-02-13) female who presents with chief complaint of  Chief Complaint  Patient presents with  . Follow-up    pt conv bil ven reflux  .  History of Present Illness:  The patient returns for followup evaluation after the initial visit. The patient continues to have mild pain in the lower extremities with dependency. The pain is lessened with elevation. Graduated compression stockings, Class I (20-30 mmHg), have been worn but the stockings do not eliminate the leg pain. Over-the-counter analgesics do not improve the symptoms. The degree of discomfort continues to interfere with daily activities. The patient notes the pain in the legs is causing problems with daily exercise, at the workplace and even with household activities and maintenance such as standing in the kitchen preparing meals and doing dishes.   Venous ultrasound shows very mild reflux in the common femral vein but not in any other deep veins,  no evidence of acute or chronic DVT.  Superficial reflux is not present   No outpatient medications have been marked as taking for the 06/02/17 encounter (Office Visit) with Delana Meyer, Dolores Lory, MD.    Past Medical History:  Diagnosis Date  . Anxiety    history of  . Headache(784.0)    SEVERE MIGRANES  . PONV (postoperative nausea and vomiting)     Past Surgical History:  Procedure Laterality Date  . KNEE ARTHROSCOPY      X 4 ON LEFT KNEE  . KNEE ARTHROSCOPY W/ OATS PROCEDURE      X1 ON LEFT KNEE  . VAGINAL HYSTERECTOMY Bilateral 12/17/2016   Procedure: HYSTERECTOMY VAGINAL WITH BILATERAL SALPINGECTOMY;  Surgeon: Osborne Oman, MD;  Location: [facility];  Service: Gynecology;  Laterality: Bilateral;  . WISDOM TOOTH EXTRACTION      ALL 4 REMOVE    Social History Social History   Tobacco Use  . Smoking status: Never Smoker  . Smokeless tobacco: Never Used  Substance Use Topics  . Alcohol use: No  . Drug use: No     Family History Family History  Problem Relation Age of Onset  . Anxiety disorder Mother   . Alcohol abuse Father   . Kidney disease Father   . Heart disease Father   . Mental illness Sister   . Alcohol abuse Maternal Aunt   . Heart disease Maternal Grandmother   . Mental illness Paternal Aunt     No Known Allergies   REVIEW OF SYSTEMS (Negative unless checked)  Constitutional: [] Weight loss  [] Fever  [] Chills Cardiac: [] Chest pain   [] Chest pressure   [] Palpitations   [] Shortness of breath when laying flat   [] Shortness of breath with exertion. Vascular:  [] Pain in legs with walking   [x] Pain in legs at rest  [] History of DVT   [] Phlebitis   [x] Swelling in legs   [] Varicose veins   [] Non-healing ulcers Pulmonary:   [] Uses home oxygen   [] Productive cough   [] Hemoptysis   [] Wheeze  [] COPD   [] Asthma Neurologic:  [] Dizziness   [] Seizures   [] History of stroke   [] History of TIA  [] Aphasia   [] Vissual changes   [] Weakness or numbness in arm   [] Weakness or numbness in leg Musculoskeletal:   [] Joint swelling   [] Joint pain   [] Low back pain Hematologic:  [] Easy bruising  [] Easy bleeding   [] Hypercoagulable state   [] Anemic Gastrointestinal:  [] Diarrhea   [] Vomiting  [] Gastroesophageal reflux/heartburn   [] Difficulty swallowing. Genitourinary:  []   Chronic kidney disease   [] Difficult urination  [] Frequent urination   [] Blood in urine Skin:  [] Rashes   [] Ulcers  Psychological:  [] History of anxiety   []  History of major depression.  Physical Examination  Vitals:   06/02/17 1405  BP: 127/84  Pulse: 84  Resp: 17  Weight: 135 lb 6.4 oz (61.4 kg)   Body mass index is 23.99 kg/m. Gen: WD/WN, NAD Head: [place]/AT, No temporalis wasting.  Ear/Nose/Throat: Hearing grossly intact, nares w/o erythema or drainage Eyes: PER, EOMI, sclera nonicteric.  Neck: Supple, no large masses.   Pulmonary:  Good air movement, no audible wheezing bilaterally, no use of accessory muscles.  Cardiac:  RRR, no JVD Vascular: scattered varicosities present bilaterally.  Mild venous stasis changes to the legs bilaterally.  2+ soft pitting edema, mild cyanosis of the plantar surface of both feet and the toes Vessel Right Left  Radial Palpable Palpable  PT Palpable Palpable  DP Palpable Palpable  Gastrointestinal: Non-distended. No guarding/no peritoneal signs.  Musculoskeletal: M/S 5/5 throughout.  No deformity or atrophy.  Neurologic: CN 2-12 intact. Symmetrical.  Speech is fluent. Motor exam as listed above. Psychiatric: Judgment intact, Mood & affect appropriate for pt's clinical situation. Dermatologic: No rashes or ulcers noted.  No changes consistent with cellulitis. Lymph : No lichenification or skin changes of chronic lymphedema.  CBC Lab Results  Component Value Date   WBC 8.7 12/18/2016   HGB 12.0 12/18/2016   HCT 33.6 (L) 12/18/2016   MCV 84.8 12/18/2016   PLT 165 12/18/2016    BMET    Component Value Date/Time   NA 139 11/23/2015 0941   K 3.9 11/23/2015 0941   CL 106 11/23/2015 0941   CO2 27 11/23/2015 0941   GLUCOSE 80 11/23/2015 0941   BUN 9 11/23/2015 0941   CREATININE 0.67 11/23/2015 0941   CALCIUM 8.9 11/23/2015 0941   GFRNONAA >90 07/24/2011 1941   GFRAA >90 07/24/2011 1941   CrCl cannot be calculated (Patient's most recent lab result is older than the maximum 21 days allowed.).  COAG No results found for: INR, PROTIME  Radiology No results found.  Assessment/Plan 1. Chronic venous insufficiency Recommend:  The patient is noting symptoms consistent with venous insufficiency and mild Raynaud's changes    I have had a long discussion with the patient regarding  varicose veins and why they cause symptoms.  Patient will begin wearing graduated compression stockings on a daily basis, beginning first thing in the morning and removing them in the evening. The patient is instructed specifically not to sleep in the stockings.    The patient  will also begin  using over-the-counter analgesics such as Motrin 600 mg po TID to help control the symptoms as needed.    In addition, behavioral modification including elevation during the day will be initiated, utilizing a recliner was recommended.  The patient is also instructed to continue exercising such as walking 4-5 times per week.  At this time the patient wishes to continue conservative therapy and is not interested in more invasive treatments such as laser ablation and sclerotherapy.  The Patient will follow up PRN if the symptoms worsen.  2. Swelling of limb See #1    Hortencia Pilar, MD  06/02/2017 8:45 PM

## 2017-11-21 ENCOUNTER — Telehealth: Payer: BLUE CROSS/BLUE SHIELD | Admitting: Family

## 2017-11-21 ENCOUNTER — Other Ambulatory Visit: Payer: Self-pay

## 2017-11-21 DIAGNOSIS — B3731 Acute candidiasis of vulva and vagina: Secondary | ICD-10-CM

## 2017-11-21 DIAGNOSIS — B373 Candidiasis of vulva and vagina: Secondary | ICD-10-CM

## 2017-11-21 MED ORDER — FLUCONAZOLE 150 MG PO TABS: 150.0000 mg | ORAL_TABLET | Freq: Once | ORAL | 0 refills | Status: AC

## 2017-11-21 NOTE — Progress Notes (Signed)

## 2017-11-21 NOTE — Telephone Encounter (Signed)
Patient is requesting a diflucan be called into [facility] pharmacy

## 2017-12-01 ENCOUNTER — Encounter: Payer: Self-pay | Admitting: Obstetrics & Gynecology

## 2017-12-01 ENCOUNTER — Ambulatory Visit (INDEPENDENT_AMBULATORY_CARE_PROVIDER_SITE_OTHER): Payer: BLUE CROSS/BLUE SHIELD | Admitting: Obstetrics & Gynecology

## 2017-12-01 VITALS — BP 132/89 | HR 85 | Resp 16 | Ht 63.0 in | Wt 130.0 lb

## 2017-12-01 DIAGNOSIS — Z90710 Acquired absence of both cervix and uterus: Secondary | ICD-10-CM | POA: Diagnosis not present

## 2017-12-01 DIAGNOSIS — Z01419 Encounter for gynecological examination (general) (routine) without abnormal findings: Secondary | ICD-10-CM | POA: Diagnosis not present

## 2017-12-01 NOTE — Progress Notes (Signed)
GYNECOLOGY ANNUAL PREVENTATIVE CARE ENCOUNTER NOTE  Subjective:   Stephanie [name] is a 37 y.o. G55P1001 female here for a routine annual gynecologic exam. She is s/p vaginal hysterectomy for benign indications in 12/2016.  Current complaints: none.   Denies abnormal vaginal bleeding, discharge, pelvic pain, problems with intercourse or other gynecologic concerns.    Gynecologic History Patient's last menstrual period was 12/10/2016. Contraception: status post hysterectomy No recent cervical dysplasia  Obstetric History OB History  Gravida Para Term Preterm AB Living  1 1 1  0 0 1  SAB TAB Ectopic Multiple Live Births  0 0 0 0 1    # Outcome Date GA Lbr Len/2nd Weight Sex Delivery Anes PTL Lv  1 Term 07/25/11 [redacted]w[redacted]d 05:32 / 00:50 8 lb 0.6 oz (3.646 kg) F Vag-Spont EPI  LIV    Past Medical History:  Diagnosis Date  . Anxiety    history of  . Anxiety disorder 04/12/2015  . Headache(784.0)    SEVERE MIGRANES  . PONV (postoperative nausea and vomiting)     Past Surgical History:  Procedure Laterality Date  . KNEE ARTHROSCOPY      X 4 ON LEFT KNEE  . KNEE ARTHROSCOPY W/ OATS PROCEDURE      X1 ON LEFT KNEE  . VAGINAL HYSTERECTOMY Bilateral 12/17/2016   Procedure: HYSTERECTOMY VAGINAL WITH BILATERAL SALPINGECTOMY;  Surgeon: Osborne Oman, MD;  Location: [facility];  Service: Gynecology;  Laterality: Bilateral;  . WISDOM TOOTH EXTRACTION      ALL 4 REMOVE    Current Outpatient Medications on File Prior to Visit  Medication Sig Dispense Refill  . ibuprofen (ADVIL,MOTRIN) 600 MG tablet Take 1 tablet (600 mg total) by mouth every 6 (six) hours as needed (mild pain). 60 tablet 2  . cyclobenzaprine (FLEXERIL) 5 MG tablet Take 1 tablet by mouth as needed.     No current facility-administered medications on file prior to visit.     No Known Allergies  Social History:  reports that she has never smoked. She has never used smokeless tobacco. She reports that she does not  drink alcohol or use drugs.  Family History  Problem Relation Age of Onset  . Anxiety disorder Mother   . Alcohol abuse Father   . Kidney disease Father   . Heart disease Father   . Mental illness Sister   . Alcohol abuse Maternal Aunt   . Heart disease Maternal Grandmother   . Mental illness Paternal Aunt     The following portions of the patient's history were reviewed and updated as appropriate: allergies, current medications, past family history, past medical history, past social history, past surgical history and problem list.  Review of Systems Pertinent items noted in HPI and remainder of comprehensive ROS otherwise negative.   Objective:  BP 132/89 (BP Location: Left Arm, Patient Position: Sitting, Cuff Size: Normal)   Pulse 85   Resp 16   Ht 5\' 3"  (1.6 m)   Wt 130 lb (59 kg)   LMP 12/10/2016   BMI 23.03 kg/m  CONSTITUTIONAL: Well-developed, well-nourished female in no acute distress.  HENT:  Normocephalic, atraumatic, External right and left ear normal. Oropharynx is clear and moist EYES: Conjunctivae and EOM are normal. Pupils are equal, round, and reactive to light. No scleral icterus.  NECK: Normal range of motion, supple, no masses.  Normal thyroid.  SKIN: Skin is warm and dry. No rash noted. Not diaphoretic. No erythema. No pallor. MUSCULOSKELETAL: Normal range of motion.  No tenderness.  No cyanosis, clubbing, or edema.  2+ distal pulses. NEUROLOGIC: Alert and oriented to person, place, and time. Normal reflexes, muscle tone coordination. No cranial nerve deficit noted. PSYCHIATRIC: Normal mood and affect. Normal behavior. Normal judgment and thought content. CARDIOVASCULAR: Normal heart rate noted, regular rhythm RESPIRATORY: Clear to auscultation bilaterally. Effort and breath sounds normal, no problems with respiration noted. BREASTS: Symmetric in size. No masses, skin changes, nipple drainage, or lymphadenopathy. ABDOMEN: Soft, normal bowel sounds, no  distention noted.  PELVIC: Deferred   Assessment and Plan:  1. Well woman exam Will return for fasting labs - Comprehensive metabolic panel; Future - CBC; Future - TSH; Future - Hemoglobin A1c; Future - Lipid panel; Future  2. S/P vaginal hysterectomy No paps needed.   Routine preventative health maintenance measures emphasized. Please refer to After Visit Summary for other counseling recommendations.    Verita Schneiders, MD, [place] for [employer] [facility], [place]

## 2017-12-01 NOTE — Progress Notes (Signed)
Hysterectomy 12/28/16 - No more PAPs Influenza vaccine  - No

## 2017-12-01 NOTE — Patient Instructions (Signed)
Return to clinic for any scheduled appointments or for any gynecologic concerns as needed.   

## 2017-12-02 ENCOUNTER — Other Ambulatory Visit: Payer: BLUE CROSS/BLUE SHIELD

## 2017-12-02 DIAGNOSIS — Z01419 Encounter for gynecological examination (general) (routine) without abnormal findings: Secondary | ICD-10-CM

## 2017-12-03 LAB — COMPREHENSIVE METABOLIC PANEL
ALT: 9 IU/L (ref 0–32)
AST: 16 IU/L (ref 0–40)
Albumin/Globulin Ratio: 1.5 (ref 1.2–2.2)
Albumin: 4 g/dL (ref 3.5–5.5)
Alkaline Phosphatase: 54 IU/L (ref 39–117)
BUN/Creatinine Ratio: 13 (ref 9–23)
BUN: 10 mg/dL (ref 6–20)
Bilirubin Total: 0.4 mg/dL (ref 0.0–1.2)
CO2: 21 mmol/L (ref 20–29)
Calcium: 9.2 mg/dL (ref 8.7–10.2)
Chloride: 102 mmol/L (ref 96–106)
Creatinine, Ser: 0.76 mg/dL (ref 0.57–1.00)
GFR calc Af Amer: 117 mL/min/{1.73_m2} (ref >59)
GFR calc non Af Amer: 101 mL/min/{1.73_m2} (ref >59)
Globulin, Total: 2.7 g/dL (ref 1.5–4.5)
Glucose: 78 mg/dL (ref 65–99)
Potassium: 4 mmol/L (ref 3.5–5.2)
Sodium: 141 mmol/L (ref 134–144)
Total Protein: 6.7 g/dL (ref 6.0–8.5)

## 2017-12-03 LAB — TSH: TSH: 3.61 u[IU]/mL (ref 0.450–4.500)

## 2017-12-03 LAB — CBC
Hematocrit: 38.9 % (ref 34.0–46.6)
Hemoglobin: 13.2 g/dL (ref 11.1–15.9)
MCH: 28.6 pg (ref 26.6–33.0)
MCHC: 33.9 g/dL (ref 31.5–35.7)
MCV: 84 fL (ref 79–97)
Platelets: 172 10*3/uL (ref 150–450)
RBC: 4.62 x10E6/uL (ref 3.77–5.28)
RDW: 12.8 % (ref 12.3–15.4)
WBC: 3 10*3/uL — ABNORMAL LOW (ref 3.4–10.8)

## 2017-12-03 LAB — HEMOGLOBIN A1C
Est. average glucose Bld gHb Est-mCnc: 103 mg/dL
Hgb A1c MFr Bld: 5.2 % (ref 4.8–5.6)

## 2017-12-03 LAB — LIPID PANEL
Chol/HDL Ratio: 3.9 ratio (ref 0.0–4.4)
Cholesterol, Total: 158 mg/dL (ref 100–199)
HDL: 41 mg/dL (ref >39)
LDL Calculated: 105 mg/dL — ABNORMAL HIGH (ref 0–99)
Triglycerides: 59 mg/dL (ref 0–149)
VLDL Cholesterol Cal: 12 mg/dL (ref 5–40)

## 2018-02-10 DIAGNOSIS — J209 Acute bronchitis, unspecified: Secondary | ICD-10-CM | POA: Diagnosis not present

## 2018-03-27 DIAGNOSIS — R208 Other disturbances of skin sensation: Secondary | ICD-10-CM | POA: Diagnosis not present

## 2018-03-27 DIAGNOSIS — D2339 Other benign neoplasm of skin of other parts of face: Secondary | ICD-10-CM | POA: Diagnosis not present

## 2018-03-27 DIAGNOSIS — D2372 Other benign neoplasm of skin of left lower limb, including hip: Secondary | ICD-10-CM | POA: Diagnosis not present

## 2018-03-27 DIAGNOSIS — D2362 Other benign neoplasm of skin of left upper limb, including shoulder: Secondary | ICD-10-CM | POA: Diagnosis not present

## 2018-04-13 DIAGNOSIS — Z87898 Personal history of other specified conditions: Secondary | ICD-10-CM

## 2018-04-13 MED ORDER — SCOPOLAMINE 1 MG/3DAYS TD PT72: 1.0000 | MEDICATED_PATCH | TRANSDERMAL | 1 refills | Status: DC

## 2018-04-20 DIAGNOSIS — Z23 Encounter for immunization: Secondary | ICD-10-CM | POA: Diagnosis not present

## 2018-04-20 DIAGNOSIS — S61216A Laceration without foreign body of right little finger without damage to nail, initial encounter: Secondary | ICD-10-CM | POA: Diagnosis not present

## 2018-08-26 DIAGNOSIS — M2629 Other anomalies of dental arch relationship: Secondary | ICD-10-CM | POA: Diagnosis not present

## 2018-09-15 ENCOUNTER — Ambulatory Visit (INDEPENDENT_AMBULATORY_CARE_PROVIDER_SITE_OTHER): Payer: BC Managed Care – PPO | Admitting: Family Medicine

## 2018-09-15 ENCOUNTER — Encounter: Payer: Self-pay | Admitting: Family Medicine

## 2018-09-15 VITALS — BP 119/84 | HR 101 | Ht 63.0 in | Wt 130.0 lb

## 2018-09-15 DIAGNOSIS — N63 Unspecified lump in unspecified breast: Secondary | ICD-10-CM | POA: Insufficient documentation

## 2018-09-15 NOTE — Progress Notes (Signed)
found a lump in her left breast today, does monthly checks and this has not been there in past. No family hx of Breast CA

## 2018-09-15 NOTE — Assessment & Plan Note (Signed)
Suspect this is fibrocystic breast changes and will check mammo and u/s. Work on limiting caffeine and increasing vitamin E and Evening Primrose oil.

## 2018-09-15 NOTE — Progress Notes (Signed)
    Subjective:    Patient ID: Stephanie [name] is a 38 y.o. female presenting with Breast Problem  on 09/15/2018  HPI: Notes new lump in breast, mildly tender. Left breast. Usually checks breasts regularly. Denies f/h breast cancer. Drinks 2 cups of coffee daily. 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   Review of Systems  Constitutional: Negative for chills and fever.  Respiratory: Negative for shortness of breath.   Cardiovascular: Negative for chest pain.  Gastrointestinal: Negative for abdominal pain, nausea and vomiting.  Genitourinary: Negative for dysuria.  Skin: Negative for rash.      Objective:    BP 119/84   Pulse (!) 101   Ht 5\' 3"  (1.6 m)   Wt 130 lb (59 kg)   LMP 12/10/2016   BMI 23.03 kg/m  Physical Exam Constitutional:      General: She is not in acute distress.    Appearance: She is well-developed.  HENT:     Head: Normocephalic and atraumatic.  Eyes:     General: No scleral icterus. Neck:     Musculoskeletal: Neck supple.  Cardiovascular:     Rate and Rhythm: Normal rate.  Pulmonary:     Effort: Pulmonary effort is normal.  Chest:    Abdominal:     Palpations: Abdomen is soft.  Skin:    General: Skin is warm and dry.  Neurological:     Mental Status: She is alert and oriented to person, place, and time.         Assessment & Plan:   Problem List Items Addressed This Visit      Unprioritized   Breast lump - Primary    Suspect this is fibrocystic breast changes and will check mammo and u/s. Work on limiting caffeine and increasing vitamin E and Evening Primrose oil.      Relevant Orders   MM DIAG BREAST TOMO BILATERAL   US BREAST LTD UNI LEFT INC AXILLA      Total face-to-face time with patient: 15 minutes. Over 50% of encounter was spent on counseling and coordination of care. Return if symptoms worsen or fail to improve.  Donnamae Jude 09/15/2018 11:15 AM

## 2018-09-15 NOTE — Patient Instructions (Signed)
Fibrocystic Breast Changes  Fibrocystic breast changes are changes that can make your breasts swollen or painful. These changes happen when tiny sacs of fluid (cysts) form in the breast. This is a common condition. It does not mean that you have cancer. It usually happens because of hormone changes during a monthly period. Follow these instructions at home:  Check your breasts after every monthly period. If you do not have monthly periods, check your breasts on the first day of every month. Check for: ? Soreness. ? New swelling or puffiness. ? A change in breast size. ? A change in a lump that was already there.  Take over-the-counter and prescription medicines only as told by your doctor.  Wear a support or sports bra that fits well. Wear this support especially when you are exercising.  Avoid or have less caffeine, fat, and sugar in what you eat and drink as told by your doctor. Contact a doctor if:  You have fluid coming from your nipple, especially if the fluid has blood in it.  You have new lumps or bumps in your breast.  Your breast gets puffy, red, and painful.  You have changes in how your breast looks.  Your nipple looks flat or it sinks into your breast. Get help right away if:  Your breast turns red, and the redness is spreading. Summary  Fibrocystic breast changes are changes that can make your breasts swollen or painful.  This condition can happen when you have hormone changes during your monthly period.  With this condition, it is important to check your breasts after every monthly period. If you do not have monthly periods, check your breasts on the first day of every month. This information is not intended to replace advice given to you by your health care provider. Make sure you discuss any questions you have with your health care provider. Document Released: 02/15/2008 Document Revised: 06/25/2018 Document Reviewed: 11/16/2015 Elsevier Patient Education  2020  [employer].

## 2018-09-16 ENCOUNTER — Encounter: Payer: Self-pay | Admitting: General Surgery

## 2018-09-16 ENCOUNTER — Ambulatory Visit (INDEPENDENT_AMBULATORY_CARE_PROVIDER_SITE_OTHER): Payer: BC Managed Care – PPO

## 2018-09-16 ENCOUNTER — Ambulatory Visit (INDEPENDENT_AMBULATORY_CARE_PROVIDER_SITE_OTHER): Payer: BC Managed Care – PPO | Admitting: General Surgery

## 2018-09-16 ENCOUNTER — Other Ambulatory Visit: Payer: Self-pay

## 2018-09-16 VITALS — BP 134/93 | HR 89 | Temp 98.1°F | Ht 63.0 in | Wt 130.0 lb

## 2018-09-16 DIAGNOSIS — N6320 Unspecified lump in the left breast, unspecified quadrant: Secondary | ICD-10-CM

## 2018-09-16 NOTE — Progress Notes (Signed)
Patient ID: Stephanie [name], female   DOB: 1980/04/04, 38 y.o.   MRN: [id number]  Chief Complaint  Patient presents with  . Mass    HPI Stephanie [name] is a 38 y.o. female here today for a evaluation of a left breast mass. Patient noticed this area yesterday.  The patient has had some very mild weight loss recently, intentional.  She regularly does breast self-examinations every 6 weeks, plus or minus.  She nursed her 72 and only child 7 years ago without incident.  No history of trauma.  The patient saw her GYN yesterday for assessment who was arranged for mammogram and ultrasound next week.  Her mother works in [employer] at [employer] and she is seen today to help alleviate family concerns.  No change in baseline caffeine consumption, approximately 2 cups of coffee/tea per day.  The patient did undergo hysterectomy with bilateral salpingectomy in the last couple of years.  She is able to sense based on her breast sensation when her menses would normally be coming.  No recent change in the sensation.  Past Medical History:  Diagnosis Date  . Anxiety    history of  . Anxiety disorder 04/12/2015  . Headache(784.0)    SEVERE MIGRANES  . PONV (postoperative nausea and vomiting)     Past Surgical History:  Procedure Laterality Date  . KNEE ARTHROSCOPY      X 4 ON LEFT KNEE  . KNEE ARTHROSCOPY W/ OATS PROCEDURE      X1 ON LEFT KNEE  . VAGINAL HYSTERECTOMY Bilateral 12/17/2016   Procedure: HYSTERECTOMY VAGINAL WITH BILATERAL SALPINGECTOMY;  Surgeon: Osborne Oman, MD;  Location: [facility];  Service: Gynecology;  Laterality: Bilateral;  . WISDOM TOOTH EXTRACTION      ALL 4 REMOVE    Family History  Problem Relation Age of Onset  . Anxiety disorder Mother   . Alcohol abuse Father   . Kidney disease Father   . Heart disease Father   . Mental illness Sister   . Alcohol abuse Maternal Aunt   . Heart disease Maternal Grandmother   . Mental illness Paternal  Aunt     Social History Social History   Tobacco Use  . Smoking status: Never Smoker  . Smokeless tobacco: Never Used  Substance Use Topics  . Alcohol use: No  . Drug use: No    No Known Allergies  Current Outpatient Medications  Medication Sig Dispense Refill  . cyclobenzaprine (FLEXERIL) 5 MG tablet Take 1 tablet by mouth as needed.    Marland Kitchen ibuprofen (ADVIL,MOTRIN) 600 MG tablet Take 1 tablet (600 mg total) by mouth every 6 (six) hours as needed (mild pain). 60 tablet 2  . Multiple Vitamin (MULTIVITAMIN) tablet Take 1 tablet by mouth daily.    Marland Kitchen scopolamine (TRANSDERM-SCOP) 1 MG/3DAYS Place 1 patch (1.5 mg total) onto the skin every 3 (three) days. 4 patch 1   No current facility-administered medications for this visit.     Review of Systems Review of Systems  Constitutional: Negative.   Respiratory: Negative.   Cardiovascular: Negative.     Blood pressure (!) 134/93, pulse 89, temperature 98.1 F (36.7 C), temperature source Skin, height 5\' 3"  (1.6 m), weight 130 lb (59 kg), last menstrual period 12/10/2016, SpO2 98 %.  Physical Exam Physical Exam Exam conducted with a chaperone present.  Constitutional:      Appearance: She is well-developed.  Eyes:     General: No scleral icterus.    Conjunctiva/sclera: Conjunctivae  normal.  Neck:     Musculoskeletal: Neck supple.  Cardiovascular:     Rate and Rhythm: Normal rate and regular rhythm.     Heart sounds: Normal heart sounds.  Pulmonary:     Effort: Pulmonary effort is normal.     Breath sounds: Normal breath sounds.  Chest:     Breasts:        Right: Normal.     Lymphadenopathy:     Cervical: No cervical adenopathy.  Skin:    General: Skin is warm and dry.  Neurological:     Mental Status: She is alert and oriented to person, place, and time.     Data Reviewed Ultrasound examination of the axillary tail and lower axilla of the left breast was undertaken.  In the area of palpable thickening a 0.37 x  0.61 x 0.68 smoothly marginated lesion with posterior acoustic enhancement and a hyperechoic hilum consistent with a lymph nodes identified.  No vascular flow. No other pathology identified.  No cystic or solid lesions.  BI-RADS-2.  Assessment Small, normal-appearing lymph node as source of recently up appreciated breast nodule.  Plan The patient will proceed with her scheduled mammograms next week at [facility].  She will call if there is any interval change.  Follow-up otherwise will be on an as-needed basis.   Return as needed.The patient is aware to call back for any questions or concerns.   HPI, Physical Exam, Assessment and Plan have been scribed under the direction and in the presence of Hervey Ard, MD.  Stephanie [name], CMA  I have completed the exam and reviewed the above documentation for accuracy and completeness.  I agree with the above.  Haematologist has been used and any errors in dictation or transcription are unintentional.  Hervey Ard, M.D., F.A.C.S. [place] [facility]ason Daryl Quiros 09/16/2018, 7:41 PM

## 2018-09-16 NOTE — Patient Instructions (Signed)
Return as needed.The patient is aware to call back for any questions or concerns.  

## 2018-09-21 ENCOUNTER — Ambulatory Visit
Admission: RE | Admit: 2018-09-21 | Discharge: 2018-09-21 | Disposition: A | Discharge status: Home / Self Care | Payer: BC Managed Care – PPO | Source: Ambulatory Visit | Attending: Family Medicine | Admitting: Family Medicine

## 2018-09-21 ENCOUNTER — Other Ambulatory Visit: Payer: Self-pay

## 2018-09-21 DIAGNOSIS — R922 Inconclusive mammogram: Secondary | ICD-10-CM | POA: Diagnosis not present

## 2018-09-21 DIAGNOSIS — N6489 Other specified disorders of breast: Secondary | ICD-10-CM | POA: Diagnosis not present

## 2018-09-21 DIAGNOSIS — N63 Unspecified lump in unspecified breast: Secondary | ICD-10-CM

## 2018-10-01 ENCOUNTER — Encounter: Payer: Self-pay | Admitting: Radiology

## 2018-10-22 DIAGNOSIS — M2629 Other anomalies of dental arch relationship: Secondary | ICD-10-CM | POA: Insufficient documentation

## 2018-12-03 DIAGNOSIS — M2629 Other anomalies of dental arch relationship: Secondary | ICD-10-CM | POA: Diagnosis not present

## 2018-12-14 ENCOUNTER — Ambulatory Visit (INDEPENDENT_AMBULATORY_CARE_PROVIDER_SITE_OTHER): Payer: BC Managed Care – PPO | Admitting: Obstetrics & Gynecology

## 2018-12-14 ENCOUNTER — Encounter: Payer: Self-pay | Admitting: Obstetrics & Gynecology

## 2018-12-14 ENCOUNTER — Other Ambulatory Visit: Payer: Self-pay

## 2018-12-14 VITALS — BP 134/89 | HR 74 | Wt 134.5 lb

## 2018-12-14 DIAGNOSIS — Z01419 Encounter for gynecological examination (general) (routine) without abnormal findings: Secondary | ICD-10-CM | POA: Diagnosis not present

## 2018-12-14 DIAGNOSIS — Z90710 Acquired absence of both cervix and uterus: Secondary | ICD-10-CM

## 2018-12-14 NOTE — Patient Instructions (Signed)
Return to clinic for any scheduled appointments or for any gynecologic concerns as needed.   

## 2018-12-14 NOTE — Progress Notes (Signed)
GYNECOLOGY ANNUAL PREVENTATIVE CARE ENCOUNTER NOTE  History:     Stephanie [name] is a 38 y.o. [id number] female here for a routine annual gynecologic exam.  Current complaints: none.   Denies abnormal vaginal bleeding, discharge, pelvic pain, problems with intercourse or other gynecologic concerns.    Gynecologic History Patient's last menstrual period was 12/10/2016. Contraception: status post hysterectomy in 2018, no recent cervical dysplasia Last mammogram: 09/21/2018. Results were: normal  Obstetric History OB History  Gravida Para Term Preterm AB Living  1 1 1  0 0 1  SAB TAB Ectopic Multiple Live Births  0 0 0 0 1    # Outcome Date GA Lbr Len/2nd Weight Sex Delivery Anes PTL Lv  1 Term 07/25/11 [redacted]w[redacted]d 05:32 / 00:50 8 lb 0.6 oz (3.646 kg) F Vag-Spont EPI  LIV    Past Medical History:  Diagnosis Date  . Anxiety    history of  . Anxiety disorder 04/12/2015  . Headache(784.0)    SEVERE MIGRANES  . PONV (postoperative nausea and vomiting)     Past Surgical History:  Procedure Laterality Date  . KNEE ARTHROSCOPY      X 4 ON LEFT KNEE  . KNEE ARTHROSCOPY W/ OATS PROCEDURE      X1 ON LEFT KNEE  . VAGINAL HYSTERECTOMY Bilateral 12/17/2016   Procedure: HYSTERECTOMY VAGINAL WITH BILATERAL SALPINGECTOMY;  Surgeon: Osborne Oman, MD;  Location: [facility];  Service: Gynecology;  Laterality: Bilateral;  . WISDOM TOOTH EXTRACTION      ALL 4 REMOVE    Current Outpatient Medications on File Prior to Visit  Medication Sig Dispense Refill  . cyclobenzaprine (FLEXERIL) 5 MG tablet Take 1 tablet by mouth as needed.    Marland Kitchen ibuprofen (ADVIL,MOTRIN) 600 MG tablet Take 1 tablet (600 mg total) by mouth every 6 (six) hours as needed (mild pain). (Patient not taking: Reported on 12/14/2018) 60 tablet 2  . Multiple Vitamin (MULTIVITAMIN) tablet Take 1 tablet by mouth daily.    Marland Kitchen scopolamine (TRANSDERM-SCOP) 1 MG/3DAYS Place 1 patch (1.5 mg total) onto the skin every 3 (three) days.  (Patient not taking: Reported on 12/14/2018) 4 patch 1   No current facility-administered medications on file prior to visit.     No Known Allergies  Social History:  reports that she has never smoked. She has never used smokeless tobacco. She reports that she does not drink alcohol or use drugs.  Family History  Problem Relation Age of Onset  . Anxiety disorder Mother   . Alcohol abuse Father   . Kidney disease Father   . Heart disease Father   . Mental illness Sister   . Alcohol abuse Maternal Aunt   . Heart disease Maternal Grandmother   . Mental illness Paternal Aunt   . Breast cancer Neg Hx     The following portions of the patient's history were reviewed and updated as appropriate: allergies, current medications, past family history, past medical history, past social history, past surgical history and problem list.  Review of Systems Pertinent items noted in HPI and remainder of comprehensive ROS otherwise negative.  Physical Exam:  BP 134/89   Pulse 74   Wt 134 lb 8 oz (61 kg)   LMP 12/10/2016   BMI 23.83 kg/m  CONSTITUTIONAL: Well-developed, well-nourished female in no acute distress.  HENT:  Normocephalic, atraumatic, External right and left ear normal. Oropharynx is clear and moist EYES: Conjunctivae and EOM are normal. Pupils are equal, round, and reactive to light.  No scleral icterus.  NECK: Normal range of motion, supple, no masses.  Normal thyroid.  SKIN: Skin is warm and dry. No rash noted. Not diaphoretic. No erythema. No pallor. MUSCULOSKELETAL: Normal range of motion. No tenderness.  No cyanosis, clubbing, or edema.  2+ distal pulses. NEUROLOGIC: Alert and oriented to person, place, and time. Normal reflexes, muscle tone coordination. No cranial nerve deficit noted. PSYCHIATRIC: Normal mood and affect. Normal behavior. Normal judgment and thought content. CARDIOVASCULAR: Normal heart rate noted, regular rhythm RESPIRATORY: Clear to auscultation bilaterally.  Effort and breath sounds normal, no problems with respiration noted. BREASTS: Symmetric in size. No masses, skin changes, nipple drainage, or lymphadenopathy. ABDOMEN: Soft, normal bowel sounds, no distention noted.  PELVIC: Deferred   Assessment and Plan:      1. Well woman exam 2. S/P vaginal hysterectomy No cervical cancer screening needed. Pelvic exam deferred this year, will do next year for surveillance. Mammogram scheduled is up to date Routine preventative health maintenance measures emphasized. Please refer to After Visit Summary for other counseling recommendations.      Verita Schneiders, MD, [place] for [employer], [place]

## 2018-12-22 ENCOUNTER — Telehealth: Payer: BC Managed Care – PPO | Admitting: Nurse Practitioner

## 2018-12-22 DIAGNOSIS — N3000 Acute cystitis without hematuria: Secondary | ICD-10-CM | POA: Diagnosis not present

## 2018-12-22 MED ORDER — CEPHALEXIN 500 MG PO CAPS: 500.0000 mg | ORAL_CAPSULE | Freq: Two times a day (BID) | ORAL | 0 refills | Status: DC

## 2018-12-22 MED ORDER — FLUCONAZOLE 150 MG PO TABS: 150.0000 mg | ORAL_TABLET | Freq: Once | ORAL | 0 refills | Status: AC

## 2018-12-22 NOTE — Progress Notes (Signed)

## 2018-12-22 NOTE — Addendum Note (Signed)
Addended by: Chevis Pretty on: 12/22/2018 05:27 PM   Modules accepted: Orders

## 2019-01-21 DIAGNOSIS — M2629 Other anomalies of dental arch relationship: Secondary | ICD-10-CM | POA: Diagnosis not present

## 2019-02-25 DIAGNOSIS — M2629 Other anomalies of dental arch relationship: Secondary | ICD-10-CM | POA: Diagnosis not present

## 2019-03-13 DIAGNOSIS — Z20828 Contact with and (suspected) exposure to other viral communicable diseases: Secondary | ICD-10-CM | POA: Diagnosis not present

## 2019-03-13 DIAGNOSIS — Z01812 Encounter for preprocedural laboratory examination: Secondary | ICD-10-CM | POA: Diagnosis not present

## 2019-03-16 DIAGNOSIS — M2629 Other anomalies of dental arch relationship: Secondary | ICD-10-CM | POA: Diagnosis not present

## 2019-03-16 DIAGNOSIS — K006 Disturbances in tooth eruption: Secondary | ICD-10-CM | POA: Diagnosis not present

## 2019-03-16 DIAGNOSIS — M2602 Maxillary hypoplasia: Secondary | ICD-10-CM | POA: Diagnosis not present

## 2019-03-25 DIAGNOSIS — Z09 Encounter for follow-up examination after completed treatment for conditions other than malignant neoplasm: Secondary | ICD-10-CM | POA: Diagnosis not present

## 2019-04-29 DIAGNOSIS — M2629 Other anomalies of dental arch relationship: Secondary | ICD-10-CM | POA: Diagnosis not present

## 2019-07-19 ENCOUNTER — Telehealth: Payer: BC Managed Care – PPO | Admitting: Family

## 2019-07-19 DIAGNOSIS — B3731 Acute candidiasis of vulva and vagina: Secondary | ICD-10-CM

## 2019-07-19 DIAGNOSIS — N898 Other specified noninflammatory disorders of vagina: Secondary | ICD-10-CM

## 2019-07-19 DIAGNOSIS — B373 Candidiasis of vulva and vagina: Secondary | ICD-10-CM

## 2019-07-19 MED ORDER — FLUCONAZOLE 150 MG PO TABS: 150.0000 mg | ORAL_TABLET | ORAL | 0 refills | Status: DC | PRN

## 2019-07-19 NOTE — Progress Notes (Signed)

## 2019-09-13 DIAGNOSIS — H524 Presbyopia: Secondary | ICD-10-CM | POA: Diagnosis not present

## 2019-10-04 ENCOUNTER — Encounter: Payer: Self-pay | Admitting: Radiology

## 2020-02-08 ENCOUNTER — Ambulatory Visit: Payer: BC Managed Care – PPO | Admitting: Obstetrics & Gynecology

## 2020-02-17 ENCOUNTER — Ambulatory Visit
Admission: EM | Admit: 2020-02-17 | Discharge: 2020-02-17 | Disposition: A | Discharge status: Home / Self Care | Payer: BC Managed Care – PPO | Attending: Family Medicine | Admitting: Family Medicine

## 2020-02-17 DIAGNOSIS — L0291 Cutaneous abscess, unspecified: Secondary | ICD-10-CM

## 2020-02-17 MED ORDER — FLUCONAZOLE 150 MG PO TABS: 150.0000 mg | ORAL_TABLET | Freq: Every day | ORAL | 0 refills | Status: DC

## 2020-02-17 MED ORDER — AMOXICILLIN-POT CLAVULANATE 875-125 MG PO TABS: 1.0000 | ORAL_TABLET | Freq: Two times a day (BID) | ORAL | 0 refills | Status: DC

## 2020-02-17 NOTE — Discharge Instructions (Addendum)
Take the amoxicillin as prescribed.  Recommend warm compresses or hot soaks to the area Please follow-up for any continued or worsening issues

## 2020-02-17 NOTE — ED Triage Notes (Signed)
Patient presents to Urgent Care with complaints of a cyst. Reports has a hx of a small cyst to right lower buttocks area. She noted that the size has increased and now exp. Pain to area.

## 2020-02-17 NOTE — ED Provider Notes (Signed)
Stephanie [name]    CSN: [id number] Arrival date & time: 02/17/20  0913      History   Chief Complaint Chief Complaint  Patient presents with  . Abscess    HPI Stephanie [name] is a 39 y.o. female.   Patient is a 39 year old female who presents today with complaints of possible abscess.  Reporting has had a cyst to left buttocks area for many years but over the past couple days has become more painful, swollen and red.  Warm to touch.  She has low-grade fever today.  No drainage from the area.     Past Medical History:  Diagnosis Date  . Anxiety    history of  . Anxiety disorder 04/12/2015  . Headache(784.0)    SEVERE MIGRANES  . PONV (postoperative nausea and vomiting)     Patient Active Problem List   Diagnosis Date Noted  . Breast lump 09/15/2018  . Chronic venous insufficiency 04/27/2017  . Swelling of limb 04/27/2017    Past Surgical History:  Procedure Laterality Date  . KNEE ARTHROSCOPY      X 4 ON LEFT KNEE  . KNEE ARTHROSCOPY W/ OATS PROCEDURE      X1 ON LEFT KNEE  . VAGINAL HYSTERECTOMY Bilateral 12/17/2016   Procedure: HYSTERECTOMY VAGINAL WITH BILATERAL SALPINGECTOMY;  Surgeon: Osborne Oman, MD;  Location: [facility];  Service: Gynecology;  Laterality: Bilateral;  . WISDOM TOOTH EXTRACTION      ALL 4 REMOVE    OB History    Gravida  1   Para  1   Term  1   Preterm  0   AB  0   Living  1     SAB  0   TAB  0   Ectopic  0   Multiple  0   Live Births  1            Home Medications    Prior to Admission medications   Medication Sig Start Date End Date Taking? Authorizing Provider  amoxicillin-clavulanate (AUGMENTIN) 875-125 MG tablet Take 1 tablet by mouth every 12 (twelve) hours. 02/17/20   Loura Halt A, NP  Multiple Vitamin (MULTIVITAMIN) tablet Take 1 tablet by mouth daily.    [provider]    Family History Family History  Problem Relation Age of Onset  . Anxiety disorder Mother   .  Alcohol abuse Father   . Kidney disease Father   . Heart disease Father   . Mental illness Sister   . Alcohol abuse Maternal Aunt   . Heart disease Maternal Grandmother   . Mental illness Paternal Aunt   . Breast cancer Neg Hx     Social History Social History   Tobacco Use  . Smoking status: Never Smoker  . Smokeless tobacco: Never Used  Substance Use Topics  . Alcohol use: No  . Drug use: No     Allergies   Patient has no known allergies.   Review of Systems Review of Systems   Physical Exam Triage Vital Signs ED Triage Vitals  Enc Vitals Group     BP 02/17/20 0932 131/89     Pulse Rate 02/17/20 0932 100     Resp --      Temp 02/17/20 0932 99.2 F (37.3 C)     Temp Source 02/17/20 0932 Oral     SpO2 02/17/20 0932 98 %     Weight --      Height --  Head Circumference --      Peak Flow --      Pain Score 02/17/20 0931 3     Pain Loc --      Pain Edu? --      Excl. in [place]? --    No data found.  Updated Vital Signs BP 131/89 (BP Location: Left Arm)   Pulse 100   Temp 99.2 F (37.3 C) (Oral)   LMP 12/10/2016   SpO2 98%   Visual Acuity Right Eye Distance:   Left Eye Distance:   Bilateral Distance:    Right Eye Near:   Left Eye Near:    Bilateral Near:     Physical Exam Vitals and nursing note reviewed.  Constitutional:      General: She is not in acute distress.    Appearance: Normal appearance. She is not ill-appearing, toxic-appearing or diaphoretic.  HENT:     Head: Normocephalic.     Nose: Nose normal.     Mouth/Throat:     Pharynx: Oropharynx is clear.  Eyes:     Conjunctiva/sclera: Conjunctivae normal.  Pulmonary:     Effort: Pulmonary effort is normal.  Genitourinary:   Musculoskeletal:        General: Normal range of motion.     Cervical back: Normal range of motion.  Skin:    General: Skin is warm and dry.     Findings: No rash.  Neurological:     Mental Status: She is alert.  Psychiatric:        Mood and Affect:  Mood normal.      UC Treatments / Results  Labs (all labs ordered are listed, but only abnormal results are displayed) Labs Reviewed - No data to display  EKG   Radiology No results found.  Procedures Procedures (including critical care time)  Medications Ordered in UC Medications - No data to display  Initial Impression / Assessment and Plan / UC Course  I have reviewed the triage vital signs and the nursing notes.  Pertinent labs & imaging results that were available during my care of the patient were reviewed by me and considered in my medical decision making (see chart for details).     Abscess No indication for I&D today.  We will go ahead and treat with warm compresses and amoxicillin for infection. Recommended follow-up for any continued or worsening problems Final Clinical Impressions(s) / UC Diagnoses   Final diagnoses:  Abscess     Discharge Instructions     Take the amoxicillin as prescribed.  Recommend warm compresses or hot soaks to the area Please follow-up for any continued or worsening issues    ED Prescriptions    Medication Sig Dispense Auth. Provider   amoxicillin-clavulanate (AUGMENTIN) 875-125 MG tablet Take 1 tablet by mouth every 12 (twelve) hours. 14 tablet Burnadette Baskett A, NP     PDMP not reviewed this encounter.   Orvan July, NP 02/17/20 [phone]

## 2020-02-20 ENCOUNTER — Other Ambulatory Visit: Payer: Self-pay

## 2020-02-20 ENCOUNTER — Ambulatory Visit
Admission: RE | Admit: 2020-02-20 | Discharge: 2020-02-20 | Disposition: A | Discharge status: Home / Self Care | Payer: BC Managed Care – PPO | Source: Ambulatory Visit | Attending: Emergency Medicine | Admitting: Emergency Medicine

## 2020-02-20 VITALS — BP 132/87 | HR 92 | Temp 99.0°F | Resp 18

## 2020-02-20 DIAGNOSIS — L0231 Cutaneous abscess of buttock: Secondary | ICD-10-CM | POA: Diagnosis not present

## 2020-02-20 MED ORDER — SULFAMETHOXAZOLE-TRIMETHOPRIM 800-160 MG PO TABS: 1.0000 | ORAL_TABLET | Freq: Two times a day (BID) | ORAL | 0 refills | Status: AC

## 2020-02-20 MED ORDER — FLUCONAZOLE 200 MG PO TABS: 200.0000 mg | ORAL_TABLET | Freq: Every day | ORAL | 0 refills | Status: AC

## 2020-02-20 NOTE — Discharge Instructions (Addendum)
Discontinue the Augmentin and start the Bactrim twice daily for 10 days.  Take each dose with a full glass of water.  If you develop signs and symptoms of a yeast infection take a Diflucan tablet.  You can repeat dosing every week until symptoms resolve.  Continue to apply warm compress to the area to help facilitate the remainder of the drainage.  Keep a dressing in place until the drainage has stopped.  If you have any increase in pain, swelling, drainage, or start running fevers at the return for reevaluation, see her GYN, or go to the ER.

## 2020-02-20 NOTE — ED Triage Notes (Signed)
Pt presents for follow up for abscess on buttocks that is progressing since Wednesday: pt has been on antibiotics and has tried warm compresses with no relief.

## 2020-02-20 NOTE — ED Provider Notes (Signed)
[facility]    CSN: [id number] Arrival date & time: 02/20/20  1145      History   Chief Complaint Chief Complaint  Patient presents with  . Appointment  . Abscess    HPI Stephanie [name] is a 39 y.o. female.   HPI   39 year old female here for evaluation of a painful swollen area on her right buttock.  Patient reports that she has had this for years but 4 days ago it became more tender and inflamed.  She was evaluated at the urgent care in [facility] who determined that there was nothing to lance at that time and place patient on Augmentin.  Patient reports that she has been using the Augmentin and warm compresses and the area has not resolved.  She does indicate that there has been a little pustule formed but it is not draining.  Patient denies fever.  Past Medical History:  Diagnosis Date  . Anxiety    history of  . Anxiety disorder 04/12/2015  . Headache(784.0)    SEVERE MIGRANES  . PONV (postoperative nausea and vomiting)     Patient Active Problem List   Diagnosis Date Noted  . Breast lump 09/15/2018  . Chronic venous insufficiency 04/27/2017  . Swelling of limb 04/27/2017    Past Surgical History:  Procedure Laterality Date  . KNEE ARTHROSCOPY      X 4 ON LEFT KNEE  . KNEE ARTHROSCOPY W/ OATS PROCEDURE      X1 ON LEFT KNEE  . VAGINAL HYSTERECTOMY Bilateral 12/17/2016   Procedure: HYSTERECTOMY VAGINAL WITH BILATERAL SALPINGECTOMY;  Surgeon: Osborne Oman, MD;  Location: [facility];  Service: Gynecology;  Laterality: Bilateral;  . WISDOM TOOTH EXTRACTION      ALL 4 REMOVE    OB History    Gravida  1   Para  1   Term  1   Preterm  0   AB  0   Living  1     SAB  0   TAB  0   Ectopic  0   Multiple  0   Live Births  1            Home Medications    Prior to Admission medications   Medication Sig Start Date End Date Taking? Authorizing Provider  fluconazole (DIFLUCAN) 200 MG tablet Take 1 tablet (200 mg total)  by mouth daily for 7 days. 02/20/20 02/27/20  Margarette Canada, NP  Multiple Vitamin (MULTIVITAMIN) tablet Take 1 tablet by mouth daily.    [provider]  sulfamethoxazole-trimethoprim (BACTRIM DS) 800-160 MG tablet Take 1 tablet by mouth 2 (two) times daily for 10 days. 02/20/20 03/01/20  Margarette Canada, NP    Family History Family History  Problem Relation Age of Onset  . Anxiety disorder Mother   . Alcohol abuse Father   . Kidney disease Father   . Heart disease Father   . Mental illness Sister   . Alcohol abuse Maternal Aunt   . Heart disease Maternal Grandmother   . Mental illness Paternal Aunt   . Breast cancer Neg Hx     Social History Social History   Tobacco Use  . Smoking status: Never Smoker  . Smokeless tobacco: Never Used  Substance Use Topics  . Alcohol use: No  . Drug use: No     Allergies   Patient has no known allergies.   Review of Systems Review of Systems  Constitutional: Negative for activity change and fever.  Skin: Positive for color change.  Hematological: Negative.   Psychiatric/Behavioral: Negative.      Physical Exam Triage Vital Signs ED Triage Vitals  Enc Vitals Group     BP 02/20/20 1240 132/87     Pulse Rate 02/20/20 1240 92     Resp 02/20/20 1240 18     Temp 02/20/20 1240 99 F (37.2 C)     Temp Source 02/20/20 1240 Oral     SpO2 02/20/20 1240 100 %     Weight --      Height --      Head Circumference --      Peak Flow --      Pain Score 02/20/20 1238 9     Pain Loc --      Pain Edu? --      Excl. in [place]? --    No data found.  Updated Vital Signs BP 132/87 (BP Location: Left Arm)   Pulse 92   Temp 99 F (37.2 C) (Oral)   Resp 18   LMP 12/10/2016   SpO2 100%   Visual Acuity Right Eye Distance:   Left Eye Distance:   Bilateral Distance:    Right Eye Near:   Left Eye Near:    Bilateral Near:     Physical Exam Vitals and nursing note reviewed.  Constitutional:      General: She is not in acute  distress.    Appearance: Normal appearance. She is normal weight. She is not toxic-appearing.  HENT:     Head: Normocephalic and atraumatic.  Eyes:     General: No scleral icterus.    Extraocular Movements: Extraocular movements intact.     Conjunctiva/sclera: Conjunctivae normal.     Pupils: Pupils are equal, round, and reactive to light.  Cardiovascular:     Rate and Rhythm: Normal rate and regular rhythm.     Pulses: Normal pulses.     Heart sounds: Normal heart sounds. No murmur heard.  No gallop.   Pulmonary:     Effort: Pulmonary effort is normal.     Breath sounds: Normal breath sounds. No wheezing, rhonchi or rales.  Musculoskeletal:        General: Swelling and tenderness present. Normal range of motion.  Skin:    General: Skin is warm and dry.     Capillary Refill: Capillary refill takes less than 2 seconds.     Findings: Erythema and lesion present.  Neurological:     General: No focal deficit present.     Mental Status: She is alert and oriented to person, place, and time.  Psychiatric:        Mood and Affect: Mood normal.        Behavior: Behavior normal.        Thought Content: Thought content normal.        Judgment: Judgment normal.      UC Treatments / Results  Labs (all labs ordered are listed, but only abnormal results are displayed) Labs Reviewed  AEROBIC CULTURE (SUPERFICIAL SPECIMEN)    EKG   Radiology No results found.  Procedures Incision and Drainage  Date/Time: 02/20/2020 1:13 PM Performed by: Margarette Canada, NP Authorized by: Margarette Canada, NP   Consent:    Consent obtained:  Verbal   Consent given by:  Patient   Risks discussed:  Bleeding, incomplete drainage, pain and infection   Alternatives discussed:  No treatment and alternative treatment Location:    Type:  Abscess   Size:  Silver dollar   Location:  Anogenital   Anogenital location:  Perianal Pre-procedure details:    Skin preparation:  Chloraprep Anesthesia (see MAR  for exact dosages):    Anesthesia method:  Local infiltration   Local anesthetic:  Lidocaine 1% w/o epi (3 mL) Procedure type:    Complexity:  Simple Procedure details:    Needle aspiration: no     Incision types:  Stab incision   Incision depth:  Subcutaneous   Scalpel blade:  11   Wound management:  Probed and deloculated   Drainage:  Bloody and purulent   Drainage amount:  Scant   Wound treatment:  Wound left open   Packing materials:  None Post-procedure details:    Patient tolerance of procedure:  Tolerated well, no immediate complications Comments:     The abscess on the inner aspect of the right buttock near the gluteal fold was opened with a stab incision through the existing pustule.  The incision was widened to 1 cm to help facilitate drainage.  Drainage was mostly blood with a small amount of white curd-like drainage.  Simple collected and sent for aerobic culture.  Wound was deloculated with cotton-tipped applicators.  No further drainage able to be expressed from the wound.  Wound was cleansed with ChloraPrep and saline before being dressed with nonadherent dressing gauze and tape.   (including critical care time)  Medications Ordered in UC Medications - No data to display  Initial Impression / Assessment and Plan / UC Course  I have reviewed the triage vital signs and the nursing notes.  Pertinent labs & imaging results that were available during my care of the patient were reviewed by me and considered in my medical decision making (see chart for details).   Patient is here for evaluation of a possible abscess on the inner aspect of her right buttock.  There is a silver dollar sized area of erythema and induration with a central 3 mm pustule.  The area is warm and tender to touch.  Location suspicious for abscess versus sebaceous cyst.  Will perform incision and drainage, will send culture, will switch patient from Augmentin to Bactrim twice daily x10 days, and will give  patient Diflucan as she states antibiotics cause yeast infections.   Final Clinical Impressions(s) / UC Diagnoses   Final diagnoses:  Abscess of buttock, right     Discharge Instructions     Discontinue the Augmentin and start the Bactrim twice daily for 10 days.  Take each dose with a full glass of water.  If you develop signs and symptoms of a yeast infection take a Diflucan tablet.  You can repeat dosing every week until symptoms resolve.  Continue to apply warm compress to the area to help facilitate the remainder of the drainage.  Keep a dressing in place until the drainage has stopped.  If you have any increase in pain, swelling, drainage, or start running fevers at the return for reevaluation, see her GYN, or go to the ER.    ED Prescriptions    Medication Sig Dispense Auth. Provider   sulfamethoxazole-trimethoprim (BACTRIM DS) 800-160 MG tablet Take 1 tablet by mouth 2 (two) times daily for 10 days. 20 tablet Margarette Canada, NP   fluconazole (DIFLUCAN) 200 MG tablet Take 1 tablet (200 mg total) by mouth daily for 7 days. 7 tablet Margarette Canada, NP     PDMP not reviewed this encounter.   Margarette Canada, NP 02/20/20 1319

## 2020-02-25 LAB — AEROBIC CULTURE W GRAM STAIN (SUPERFICIAL SPECIMEN)

## 2020-02-28 ENCOUNTER — Telehealth (HOSPITAL_COMMUNITY): Payer: Self-pay | Admitting: Emergency Medicine

## 2020-02-28 MED ORDER — CEPHALEXIN 500 MG PO CAPS: 500.0000 mg | ORAL_CAPSULE | Freq: Three times a day (TID) | ORAL | 0 refills | Status: AC

## 2020-02-29 ENCOUNTER — Telehealth (HOSPITAL_COMMUNITY): Payer: Self-pay | Admitting: Emergency Medicine

## 2020-02-29 DIAGNOSIS — M10079 Idiopathic gout, unspecified ankle and foot: Secondary | ICD-10-CM | POA: Diagnosis not present

## 2020-02-29 DIAGNOSIS — M10072 Idiopathic gout, left ankle and foot: Secondary | ICD-10-CM | POA: Diagnosis not present

## 2020-03-01 DIAGNOSIS — M2550 Pain in unspecified joint: Secondary | ICD-10-CM | POA: Diagnosis not present

## 2020-03-01 DIAGNOSIS — R21 Rash and other nonspecific skin eruption: Secondary | ICD-10-CM | POA: Diagnosis not present

## 2020-03-09 ENCOUNTER — Ambulatory Visit: Payer: Self-pay | Admitting: Obstetrics & Gynecology

## 2020-04-04 ENCOUNTER — Ambulatory Visit: Payer: Self-pay | Admitting: Obstetrics & Gynecology

## 2020-09-02 DIAGNOSIS — S93402A Sprain of unspecified ligament of left ankle, initial encounter: Secondary | ICD-10-CM | POA: Diagnosis not present

## 2021-01-02 ENCOUNTER — Other Ambulatory Visit: Payer: Self-pay

## 2021-01-02 ENCOUNTER — Encounter: Payer: Self-pay | Admitting: Obstetrics & Gynecology

## 2021-01-02 ENCOUNTER — Ambulatory Visit (INDEPENDENT_AMBULATORY_CARE_PROVIDER_SITE_OTHER): Payer: BC Managed Care – PPO | Admitting: Obstetrics & Gynecology

## 2021-01-02 VITALS — BP 118/88 | HR 101 | Ht 63.0 in | Wt 134.0 lb

## 2021-01-02 DIAGNOSIS — Z01419 Encounter for gynecological examination (general) (routine) without abnormal findings: Secondary | ICD-10-CM

## 2021-01-02 DIAGNOSIS — Z1231 Encounter for screening mammogram for malignant neoplasm of breast: Secondary | ICD-10-CM | POA: Diagnosis not present

## 2021-01-02 NOTE — Progress Notes (Signed)
GYNECOLOGY ANNUAL PREVENTATIVE CARE ENCOUNTER NOTE  History:     Stephanie [name] is a 40 y.o. G41P1001 female s/p vaginal hysterectomy for AUB here for a routine annual gynecologic exam.  Current complaints: none.   Denies abnormal vaginal bleeding, discharge, pelvic pain, problems with intercourse or other gynecologic concerns.    Gynecologic History Patient's last menstrual period was 12/10/2016. Last Mammogram: 09/21/2018.  Result was normal, done for breast lump  Obstetric History OB History  Gravida Para Term Preterm AB Living  1 1 1  0 0 1  SAB IAB Ectopic Multiple Live Births  0 0 0 0 1    # Outcome Date GA Lbr Len/2nd Weight Sex Delivery Anes PTL Lv  1 Term 07/25/11 [redacted]w[redacted]d 05:32 / 00:50 8 lb 0.6 oz (3.646 kg) F Vag-Spont EPI  LIV    Past Medical History:  Diagnosis Date   Anxiety    history of   Anxiety disorder 04/12/2015   Headache(784.0)    SEVERE MIGRANES   PONV (postoperative nausea and vomiting)     Past Surgical History:  Procedure Laterality Date   KNEE ARTHROSCOPY      X 4 ON LEFT KNEE   KNEE ARTHROSCOPY W/ OATS PROCEDURE      X1 ON LEFT KNEE   MANDIBLE SURGERY     VAGINAL HYSTERECTOMY Bilateral 12/17/2016   Procedure: HYSTERECTOMY VAGINAL WITH BILATERAL SALPINGECTOMY;  Surgeon: Osborne Oman, MD;  Location: [facility];  Service: Gynecology;  Laterality: Bilateral;   WISDOM TOOTH EXTRACTION      ALL 4 REMOVE    Current Outpatient Medications on File Prior to Visit  Medication Sig Dispense Refill   Multiple Vitamin (MULTIVITAMIN) tablet Take 1 tablet by mouth daily.     No current facility-administered medications on file prior to visit.    Allergies  Allergen Reactions   Sulfa Antibiotics Anaphylaxis and Hives    Social History:  reports that she has never smoked. She has never used smokeless tobacco. She reports that she does not drink alcohol and does not use drugs.  Family History  Problem Relation Age of Onset   Anxiety  disorder Mother    Alcohol abuse Father    Kidney disease Father    Heart disease Father    Mental illness Sister    Alcohol abuse Maternal Aunt    Heart disease Maternal Grandmother    Mental illness Paternal Aunt    Breast cancer Neg Hx     The following portions of the patient's history were reviewed and updated as appropriate: allergies, current medications, past family history, past medical history, past social history, past surgical history and problem list.  Review of Systems Pertinent items noted in HPI and remainder of comprehensive ROS otherwise negative.  Physical Exam:  BP 118/88   Pulse (!) 101   Ht 5\' 3"  (1.6 m)   Wt 134 lb (60.8 kg)   LMP 12/10/2016   BMI 23.74 kg/m  CONSTITUTIONAL: Well-developed, well-nourished female in no acute distress.  HENT:  Normocephalic, atraumatic, External right and left ear normal.  EYES: Conjunctivae and EOM are normal. Pupils are equal, round, and reactive to light. No scleral icterus.  NECK: Normal range of motion, supple, no masses.  Normal thyroid.  SKIN: Skin is warm and dry. No rash noted. Not diaphoretic. No erythema. No pallor. MUSCULOSKELETAL: Normal range of motion. No tenderness.  No cyanosis, clubbing, or edema. NEUROLOGIC: Alert and oriented to person, place, and time. Normal reflexes, muscle tone  coordination.  PSYCHIATRIC: Normal mood and affect. Normal behavior. Normal judgment and thought content. CARDIOVASCULAR: Normal heart rate noted, regular rhythm RESPIRATORY: Clear to auscultation bilaterally. Effort and breath sounds normal, no problems with respiration noted. BREASTS: Symmetric in size. No masses, tenderness, skin changes, nipple drainage, or lymphadenopathy bilaterally. Performed in the presence of a chaperone. ABDOMEN: Soft, no distention noted.  No tenderness, rebound or guarding.  PELVIC: Normal appearing external genitalia and urethral meatus; normal appearing vaginal mucosa and well-healed cuff.  No  abnormal vaginal discharge noted.   No palpable masses, no adnexal tenderness.  Performed in the presence of a chaperone.   Assessment and Plan:     1. Breast cancer screening by mammogram She will be 41 early next year, this will be scheduled. - MM 3D SCREEN BREAST BILATERAL; Future  2. Well woman exam with routine gynecological exam Normal pelvic and breast exam, no issues. No need for pap given hysterectomy for benign indications. Routine preventative health maintenance measures emphasized. Please refer to After Visit Summary for other counseling recommendations.      Verita Schneiders, MD, [place] for [employer], [place]

## 2021-01-02 NOTE — Patient Instructions (Signed)
Return to clinic for any scheduled appointments or for any gynecologic concerns as needed.   

## 2021-01-12 IMAGING — US US BREAST*L* LIMITED INC AXILLA
1 series · 5 of 5 positions shown · non-contrast
Comparison: None.

CLINICAL DATA: 38-year-old female complaining of a palpable
abnormality in the left axilla. The patient states that it is
clinically getting smaller.

EXAM:
DIGITAL DIAGNOSTIC BILATERAL MAMMOGRAM WITH CAD AND TOMO
ULTRASOUND LEFT BREAST

[Series 1: us breast*left* limited inc axilla · 0.07mm/px · 5 of 5 slices shown]
[im 1/5]
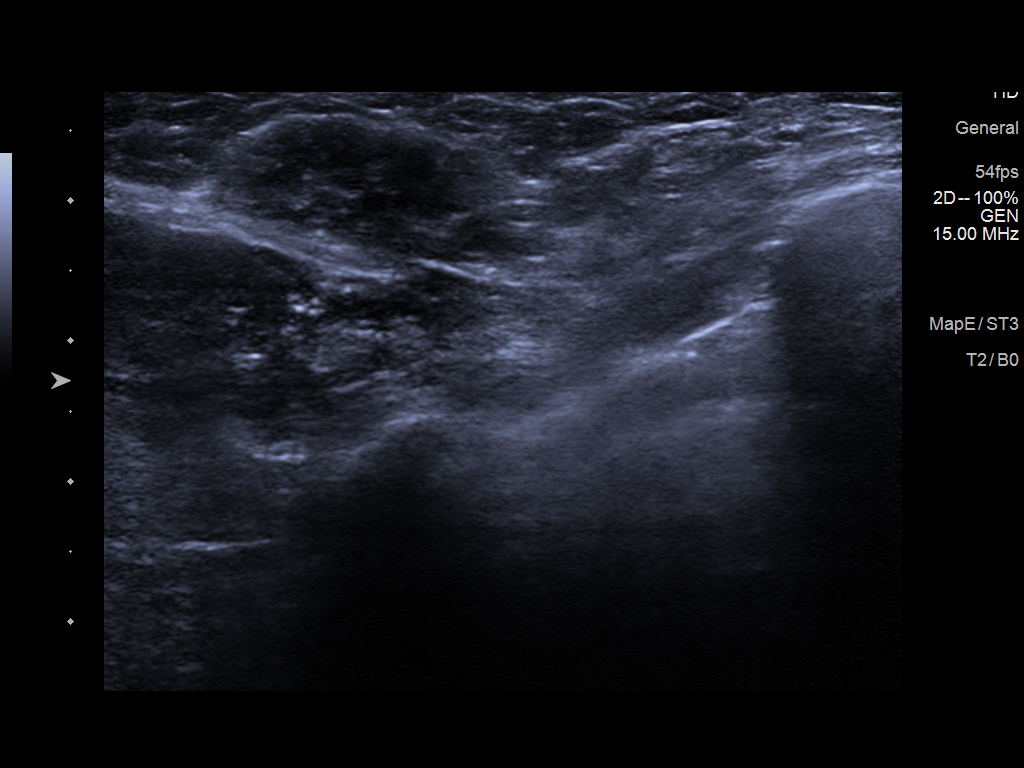
[im 2/5]
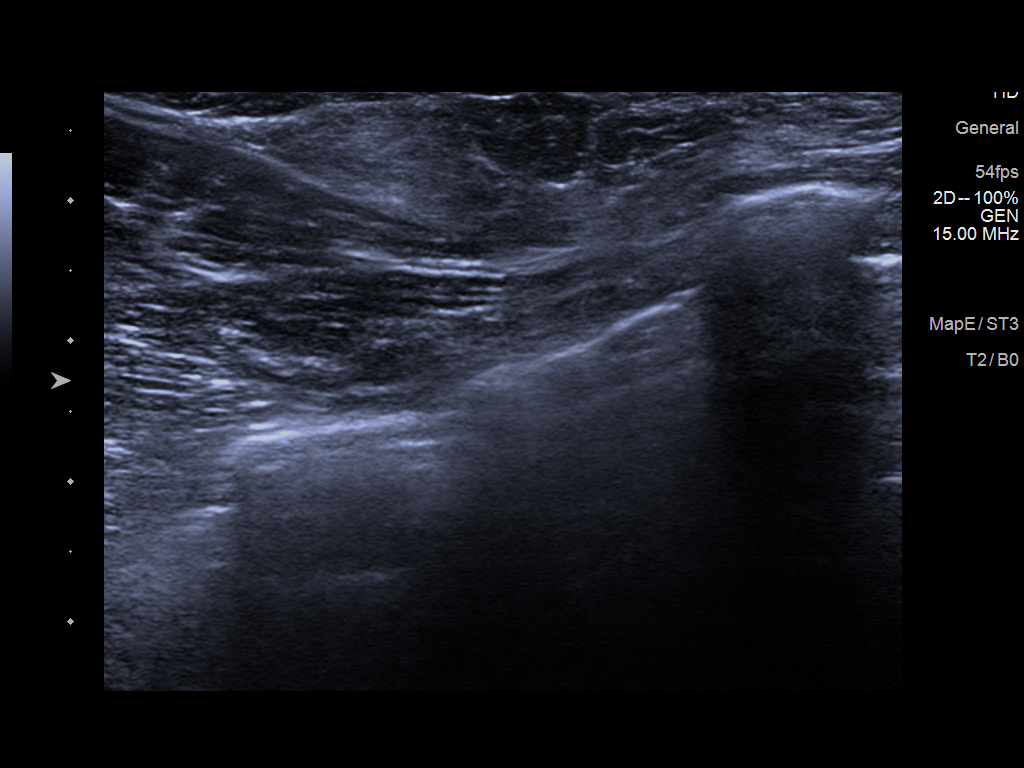
[im 3/5]
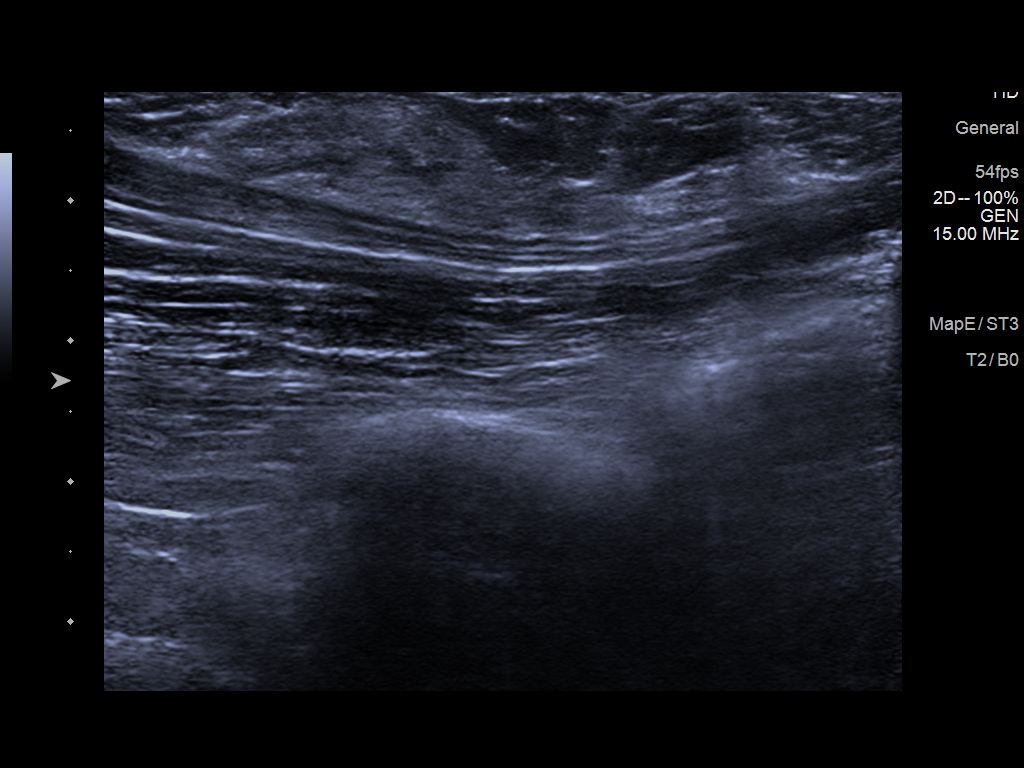
[im 4/5]
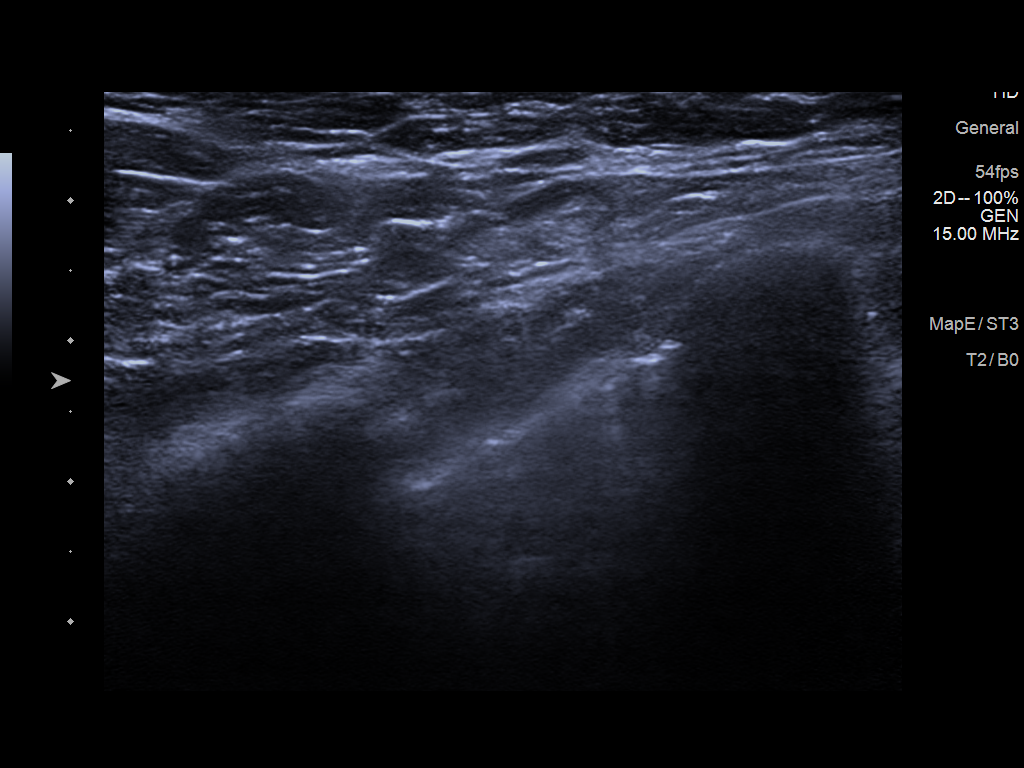
[im 5/5]
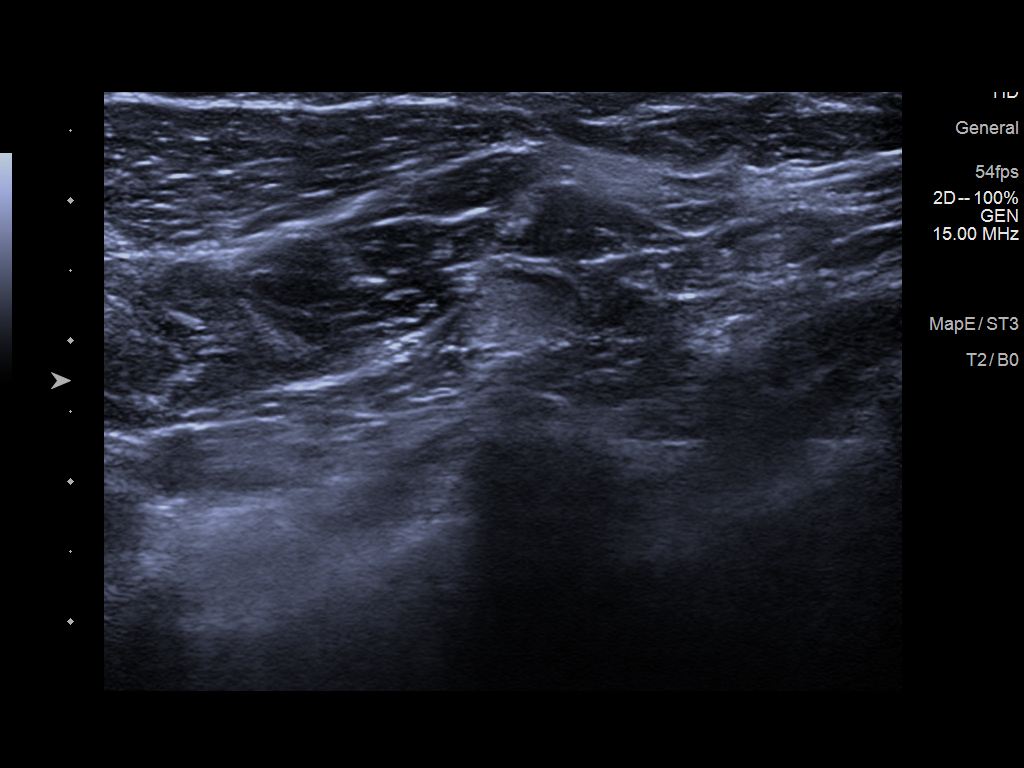

[5 of 5 positions shown; findings below may reference images not displayed]

ACR Breast Density Category d: The breast tissue is extremely dense,
which lowers the sensitivity of mammography.
FINDINGS: No suspicious mass, malignant type microcalcifications or distortion
detected in either breast.

Mammographic images were processed with CAD.

On physical exam, I do not palpate a discrete mass in the left
axilla.

Targeted ultrasound is performed, showing normal tissue in the area
of clinical concern in the left axilla. No suspicious mass,
distortion or abnormal shadowing detected. Normal lymph nodes are
seen in the left axilla. There is no enlarged adenopathy.
IMPRESSION: No evidence of malignancy in either breast.

RECOMMENDATION:
Bilateral screening mammogram can be deferred until the age 41 if
the clinical exam remains benign/stable.

I have discussed the findings and recommendations with the patient.
Results were also provided in writing at the conclusion of the
visit. If applicable, a reminder letter will be sent to the patient
regarding the next appointment.

BI-RADS CATEGORY  1: Negative.

## 2021-01-25 ENCOUNTER — Ambulatory Visit
Admission: RE | Admit: 2021-01-25 | Discharge: 2021-01-25 | Disposition: A | Discharge status: Home / Self Care | Payer: BC Managed Care – PPO | Attending: Nurse Practitioner | Admitting: Nurse Practitioner

## 2021-01-25 ENCOUNTER — Other Ambulatory Visit: Payer: Self-pay | Admitting: Nurse Practitioner

## 2021-01-25 ENCOUNTER — Ambulatory Visit
Admission: RE | Admit: 2021-01-25 | Discharge: 2021-01-25 | Disposition: A | Discharge status: Home / Self Care | Payer: BC Managed Care – PPO | Source: Ambulatory Visit | Attending: Nurse Practitioner | Admitting: Nurse Practitioner

## 2021-01-25 ENCOUNTER — Other Ambulatory Visit: Payer: Self-pay

## 2021-01-25 DIAGNOSIS — G43109 Migraine with aura, not intractable, without status migrainosus: Secondary | ICD-10-CM | POA: Diagnosis not present

## 2021-01-25 DIAGNOSIS — R52 Pain, unspecified: Secondary | ICD-10-CM | POA: Insufficient documentation

## 2021-01-25 DIAGNOSIS — M25522 Pain in left elbow: Secondary | ICD-10-CM | POA: Diagnosis not present

## 2021-01-25 DIAGNOSIS — R600 Localized edema: Secondary | ICD-10-CM | POA: Diagnosis not present

## 2021-01-26 DIAGNOSIS — R5383 Other fatigue: Secondary | ICD-10-CM | POA: Diagnosis not present

## 2021-01-26 DIAGNOSIS — M2550 Pain in unspecified joint: Secondary | ICD-10-CM | POA: Diagnosis not present

## 2021-01-26 DIAGNOSIS — E663 Overweight: Secondary | ICD-10-CM | POA: Diagnosis not present

## 2021-02-04 DIAGNOSIS — M7022 Olecranon bursitis, left elbow: Secondary | ICD-10-CM | POA: Diagnosis not present

## 2021-02-06 DIAGNOSIS — G43109 Migraine with aura, not intractable, without status migrainosus: Secondary | ICD-10-CM | POA: Diagnosis not present

## 2021-02-06 DIAGNOSIS — M2550 Pain in unspecified joint: Secondary | ICD-10-CM | POA: Diagnosis not present

## 2021-02-06 DIAGNOSIS — E782 Mixed hyperlipidemia: Secondary | ICD-10-CM | POA: Diagnosis not present

## 2021-03-30 NOTE — Progress Notes (Signed)
Office Visit Note  Patient: Stephanie [name]             Date of Birth: [birth year]           MRN: [id number]             PCP: Danelle Berry, NP Referring: Danelle Berry, NP Visit Date: 04/13/2021 Occupation: '@GUAROCC' @  Subjective:  Pain in multiple joints  History of Present Illness: Stephanie [name] is a 41 y.o. female in consultation per request of her PCP for the evaluation of positive ANA and joint pain.  According the patient she used to run and played softball in high school.  She states in 2008 she started having increased left knee joint pain at the time she was seen by an orthopedic surgeon.  She went under arthroscopic surgery for microfracture and another surgery for debridement.  She also underwent oats procedure.  And what there is minimal right.  She states 2 years later she was seen by Dr. Lorre Nick he did another partial meniscal tear repair and debridement.  She states it took her a while to recover from that but she did not completely recovered and had off-and-on pain in her left knee joint since then.  She states in December 2021 she developed an infected cyst on her right buttock which was initially treated with amoxicillin and then later with Bactrim.  10 days after Bactrim she developed rash all over her body and joint pain.  She was told it was a reaction to Bactrim.  The symptoms selects resolved after 3 weeks.  She states in January 2022 she developed upper respiratory tract infection with increased joint pain and stiffness mostly in her hands and wrist joints which resolved after couple of weeks.  She had recurrence of upper respiratory tract infection 2 weeks later with increased joint pain in her hands and wrist.  She had similar episode 3-4 times since then but later on she also had discomfort in her ankle joints.  She has noticed swelling in her hands and one time her right ring finger was swollen.  Her last episode was in December 2022 during which  she also had increased fatigue, redness on her face and joint pain.  She states last Sunday she developed pain and discomfort in her bilateral knee joints to the point she was having difficulty walking.  She took some meloxicam and symptoms resolved after 3 days.  Besides her hands, wrist joints, ankles and knees none of the other joints have been painful.  She continues to have morning stiffness lasting for 3 to 4 hours.  There is no history of oral ulcers, nasal ulcers, photosensitivity, lymphadenopathy or raynaud's phenominon.  There is family history of rheumatoid arthritis in her maternal grandmother and maternal great aunt.  Her daughter has celiac disease.  She is gravida 1, para 1.  There is no history of DVTs.  Activities of Daily Living:  Patient reports morning stiffness for 3-4 hours.   Patient Denies nocturnal pain.  Difficulty dressing/grooming: Reports Difficulty climbing stairs: Reports Difficulty getting out of chair: Reports Difficulty using hands for taps, buttons, cutlery, and/or writing: Reports  Review of Systems  Constitutional:  Positive for fatigue. Negative for night sweats.  HENT:  Negative for mouth sores, trouble swallowing, trouble swallowing, mouth dryness and nose dryness.   Eyes:  Negative for pain, redness, visual disturbance and dryness.  Respiratory:  Positive for shortness of breath. Negative for cough and difficulty breathing.  Cardiovascular:  Negative for chest pain, palpitations, hypertension, irregular heartbeat and swelling in legs/feet.  Gastrointestinal:  Negative for blood in stool, constipation and diarrhea.  Endocrine: Negative for cold intolerance and increased urination.  Genitourinary:  Negative for nocturia and vaginal dryness.  Musculoskeletal:  Positive for joint pain, joint pain, joint swelling and morning stiffness. Negative for myalgias, muscle weakness, muscle tenderness and myalgias.  Skin:  Negative for color change, rash, hair loss,  skin tightness, ulcers and sensitivity to sunlight.  Allergic/Immunologic: Negative for susceptible to infections.  Neurological:  Positive for weakness. Negative for dizziness, memory loss and night sweats.  Hematological:  Negative for swollen glands.  Psychiatric/Behavioral:  Negative for depressed mood and sleep disturbance. The patient is not nervous/anxious.    PMFS History:  Patient Active Problem List   Diagnosis Date Noted   Breast lump 09/15/2018   Chronic venous insufficiency 04/27/2017   Swelling of limb 04/27/2017    Past Medical History:  Diagnosis Date   Anxiety    history of   Anxiety disorder 04/12/2015   Headache(784.0)    SEVERE MIGRANES   PONV (postoperative nausea and vomiting)     Family History  Problem Relation Age of Onset   Anxiety disorder Mother    Cancer Father    Alcohol abuse Father    Kidney disease Father    Heart disease Father    Mental illness Sister    Heart disease Maternal Grandmother    Alcohol abuse Maternal Aunt    Mental illness Paternal Aunt    Breast cancer Neg Hx    Past Surgical History:  Procedure Laterality Date   KNEE ARTHROSCOPY      X 4 ON LEFT KNEE   KNEE ARTHROSCOPY W/ OATS PROCEDURE      X1 ON LEFT KNEE   MANDIBLE SURGERY     VAGINAL HYSTERECTOMY Bilateral 12/17/2016   Procedure: HYSTERECTOMY VAGINAL WITH BILATERAL SALPINGECTOMY;  Surgeon: Osborne Oman, MD;  Location: [facility];  Service: Gynecology;  Laterality: Bilateral;   WISDOM TOOTH EXTRACTION      ALL 4 REMOVE   Social History   Social History Narrative   Not on file   Immunization History  Administered Date(s) Administered   Influenza Split 01/31/2011   Influenza-Unspecified 12/16/2012   Tdap 01/26/2013     Objective: Vital Signs: BP 132/89 (BP Location: Right Arm, Patient Position: Sitting, Cuff Size: Small)    Pulse 87    Resp 12    Ht '5\' 4"'  (1.626 m)    Wt 138 lb 3.2 oz (62.7 kg)    LMP 12/10/2016    BMI 23.72 kg/m    Physical  Exam Vitals and nursing note reviewed.  Constitutional:      Appearance: She is well-developed.  HENT:     Head: Normocephalic and atraumatic.  Eyes:     Conjunctiva/sclera: Conjunctivae normal.  Cardiovascular:     Rate and Rhythm: Normal rate and regular rhythm.     Heart sounds: Normal heart sounds.  Pulmonary:     Effort: Pulmonary effort is normal.     Breath sounds: Normal breath sounds.  Abdominal:     General: Bowel sounds are normal.     Palpations: Abdomen is soft.  Musculoskeletal:     Cervical back: Normal range of motion.  Lymphadenopathy:     Cervical: No cervical adenopathy.  Skin:    General: Skin is warm and dry.     Capillary Refill: Capillary refill takes less than 2  seconds.  Neurological:     Mental Status: She is alert and oriented to person, place, and time.  Psychiatric:        Behavior: Behavior normal.     Musculoskeletal Exam: C-spine, thoracic and lumbar spine with good range of motion.  She had no SI joint tenderness.  Shoulder joints, elbow joints, wrist joints, MCPs PIPs and DIPs with good range of motion.  She had tenderness across her PIP joints but no synovitis was noted.  Hip joints and knee joints with good range of motion.  She had discomfort in her bilateral knee joints without any warmth swelling or effusion.  She tenderness over ankle joints.  No synovitis was noted.  She had discomfort in her MTPs when she walked.  No warmth or tenderness was noted.  CDAI Exam: CDAI Score: 7.6  Patient Global: 3 mm; Provider Global: 3 mm Swollen: 0 ; Tender: 9  Joint Exam 04/13/2021      Right  Left  PIP 2   Tender     PIP 3   Tender     PIP 4   Tender     PIP 5   Tender   Tender  Knee   Tender   Tender  Ankle   Tender   Tender     Investigation: No additional findings.  Imaging: No results found.  Recent Labs: Lab Results  Component Value Date   WBC 3.0 (L) 12/02/2017   HGB 13.2 12/02/2017   PLT 172 12/02/2017   NA 141 12/02/2017    K 4.0 12/02/2017   CL 102 12/02/2017   CO2 21 12/02/2017   GLUCOSE 78 12/02/2017   BUN 10 12/02/2017   CREATININE 0.76 12/02/2017   BILITOT 0.4 12/02/2017   ALKPHOS 54 12/02/2017   AST 16 12/02/2017   ALT 9 12/02/2017   PROT 6.7 12/02/2017   ALBUMIN 4.0 12/02/2017   CALCIUM 9.2 12/02/2017   GFRAA 117 12/02/2017    Speciality Comments: No specialty comments available.  Procedures:  No procedures performed Allergies: Sulfa antibiotics, Gluten meal, and Shellfish allergy   Assessment / Plan:     Visit Diagnoses: Positive ANA (antinuclear antibody) - 01/26/21: ANA+, ESR 2, uric acid 3.9, RF<10, CRP <1, TSH 3.270 -patient is positive ANA no titer given.  I will obtain additional labs today.  She denies any history of oral ulcers, nasal ulcers, photosensitivity, sicca symptoms, raynaud's phenominon.  She gives history of possible malar rash.  Plan: Urinalysis, Routine w reflex microscopic, ANA, Anti-scleroderma antibody, RNP Antibody, Anti-Smith antibody, Sjogrens syndrome-A extractable nuclear antibody, Sjogrens syndrome-B extractable nuclear antibody, Anti-DNA antibody, double-stranded, C3 and C4, Beta-2 glycoprotein antibodies, Cardiolipin antibodies, IgG, IgM, IgA, Lupus Anticoagulant Eval w/Reflex  Pain in both hands -she has pain and discomfort in her bilateral hands and wrist joints.  Tenderness was noted over PIPs with no synovitis.  Patient states she has been taking meloxicam.  Plan: XR Hand 2 View Right, XR Hand 2 View Left, x-rays showed mild osteoarthritic changes.  Cyclic citrul peptide antibody, IgG, 14-3-3 eta Protein  Chronic pain of both knees -she complains of pain in her bilateral knee joints.  She states she had swelling in the bilateral knee joints on Sunday which is resolved with meloxicam.  No warmth swelling or effusion was noted.  Plan: XR KNEE 3 VIEW RIGHT, XR KNEE 3 VIEW LEFT.  Right knee joint x-ray was unremarkable.  Left knee joint showed mild osteoarthritis and  moderate chondromalacia patella.  Pain in  both feet -she complains of discomfort in the bilateral feet and ankles.  No warmth swelling or effusion was noted.  Plan: XR Foot 2 Views Right, XR Foot 2 Views Left.  X-rays of bilateral feet were unremarkable.  Other fatigue -she has been experiencing increased fatigue since December.  Plan: CBC with Differential/Platelet, COMPLETE METABOLIC PANEL WITH GFR, CK, Glucose 6 phosphate dehydrogenase  History of hyperlipidemia-recent diagnosis per patient.  Hx of migraines  Chronic venous insufficiency  Family history of rheumatoid arthritis - Maternal grandmother and maternal grand aunt  Family history of celiac disease-her daughter was recently diagnosed with celiac disease.  Patient has been on gluten-free diet as well.  Orders: Orders Placed This Encounter  Procedures   XR Hand 2 View Right   XR Hand 2 View Left   XR KNEE 3 VIEW RIGHT   XR KNEE 3 VIEW LEFT   XR Foot 2 Views Right   XR Foot 2 Views Left   CBC with Differential/Platelet   COMPLETE METABOLIC PANEL WITH GFR   Urinalysis, Routine w reflex microscopic   CK   Cyclic citrul peptide antibody, IgG   14-3-3 eta Protein   ANA   Anti-scleroderma antibody   RNP Antibody   Anti-Smith antibody   Sjogrens syndrome-A extractable nuclear antibody   Sjogrens syndrome-B extractable nuclear antibody   Anti-DNA antibody, double-stranded   C3 and C4   Beta-2 glycoprotein antibodies   Cardiolipin antibodies, IgG, IgM, IgA   Lupus Anticoagulant Eval w/Reflex   Glucose 6 phosphate dehydrogenase   No orders of the defined types were placed in this encounter.   Face-to-face time spent with patient was 45 minutes. Greater than 50% of time was spent in counseling and coordination of care.  Follow-Up Instructions: Return for Polyarthralgia, positive ANA.   Bo Merino, MD  Note - This record has been created using Editor, commissioning.  Chart creation errors have been sought, but may  not always  have been located. Such creation errors do not reflect on  the standard of medical care.

## 2021-04-13 ENCOUNTER — Ambulatory Visit: Payer: Self-pay

## 2021-04-13 ENCOUNTER — Ambulatory Visit (INDEPENDENT_AMBULATORY_CARE_PROVIDER_SITE_OTHER): Payer: BC Managed Care – PPO | Admitting: Rheumatology

## 2021-04-13 ENCOUNTER — Encounter: Payer: Self-pay | Admitting: Rheumatology

## 2021-04-13 ENCOUNTER — Other Ambulatory Visit: Payer: Self-pay

## 2021-04-13 VITALS — BP 132/89 | HR 87 | Resp 12 | Ht 64.0 in | Wt 138.2 lb

## 2021-04-13 DIAGNOSIS — M79672 Pain in left foot: Secondary | ICD-10-CM

## 2021-04-13 DIAGNOSIS — Z8261 Family history of arthritis: Secondary | ICD-10-CM

## 2021-04-13 DIAGNOSIS — M25561 Pain in right knee: Secondary | ICD-10-CM | POA: Diagnosis not present

## 2021-04-13 DIAGNOSIS — Z8669 Personal history of other diseases of the nervous system and sense organs: Secondary | ICD-10-CM

## 2021-04-13 DIAGNOSIS — M79671 Pain in right foot: Secondary | ICD-10-CM

## 2021-04-13 DIAGNOSIS — M2242 Chondromalacia patellae, left knee: Secondary | ICD-10-CM

## 2021-04-13 DIAGNOSIS — M2550 Pain in unspecified joint: Secondary | ICD-10-CM

## 2021-04-13 DIAGNOSIS — R768 Other specified abnormal immunological findings in serum: Secondary | ICD-10-CM

## 2021-04-13 DIAGNOSIS — M79642 Pain in left hand: Secondary | ICD-10-CM | POA: Diagnosis not present

## 2021-04-13 DIAGNOSIS — G8929 Other chronic pain: Secondary | ICD-10-CM

## 2021-04-13 DIAGNOSIS — Z8639 Personal history of other endocrine, nutritional and metabolic disease: Secondary | ICD-10-CM

## 2021-04-13 DIAGNOSIS — R5383 Other fatigue: Secondary | ICD-10-CM

## 2021-04-13 DIAGNOSIS — M25562 Pain in left knee: Secondary | ICD-10-CM

## 2021-04-13 DIAGNOSIS — M79641 Pain in right hand: Secondary | ICD-10-CM | POA: Diagnosis not present

## 2021-04-13 DIAGNOSIS — I872 Venous insufficiency (chronic) (peripheral): Secondary | ICD-10-CM

## 2021-04-13 DIAGNOSIS — Z8379 Family history of other diseases of the digestive system: Secondary | ICD-10-CM

## 2021-04-13 DIAGNOSIS — R7689 Other specified abnormal immunological findings in serum: Secondary | ICD-10-CM

## 2021-04-20 NOTE — Telephone Encounter (Signed)
14-3-3 eta and G6PD are pending.  It can take a week-10 days for these to result.    It will be best to schedule new patient follow up with Dr. Estanislado Pandy.

## 2021-04-21 LAB — COMPLETE METABOLIC PANEL WITH GFR
AG Ratio: 1.6 (calc) (ref 1.0–2.5)
ALT: 12 U/L (ref 6–29)
AST: 20 U/L (ref 10–30)
Albumin: 4.6 g/dL (ref 3.6–5.1)
Alkaline phosphatase (APISO): 41 U/L (ref 31–125)
BUN: 15 mg/dL (ref 7–25)
CO2: 31 mmol/L (ref 20–32)
Calcium: 9.4 mg/dL (ref 8.6–10.2)
Chloride: 102 mmol/L (ref 98–110)
Creat: 0.72 mg/dL (ref 0.50–0.97)
Globulin: 2.8 g/dL (calc) (ref 1.9–3.7)
Glucose, Bld: 80 mg/dL (ref 65–99)
Potassium: 4.1 mmol/L (ref 3.5–5.3)
Sodium: 139 mmol/L (ref 135–146)
Total Bilirubin: 0.6 mg/dL (ref 0.2–1.2)
Total Protein: 7.4 g/dL (ref 6.1–8.1)
eGFR: 109 mL/min/{1.73_m2} (ref >=60)

## 2021-04-21 LAB — CBC WITH DIFFERENTIAL/PLATELET
Absolute Monocytes: 268 cells/uL (ref 200–950)
Basophils Absolute: 28 cells/uL (ref 0–200)
Basophils Relative: 0.7 %
Eosinophils Absolute: 0 cells/uL — ABNORMAL LOW (ref 15–500)
Eosinophils Relative: 0 %
HCT: 43.8 % (ref 35.0–45.0)
Hemoglobin: 14.9 g/dL (ref 11.7–15.5)
Lymphs Abs: 1248 cells/uL (ref 850–3900)
MCH: 29.6 pg (ref 27.0–33.0)
MCHC: 34 g/dL (ref 32.0–36.0)
MCV: 86.9 fL (ref 80.0–100.0)
MPV: 11.1 fL (ref 7.5–12.5)
Monocytes Relative: 6.7 %
Neutro Abs: 2456 cells/uL (ref 1500–7800)
Neutrophils Relative %: 61.4 %
Platelets: 172 10*3/uL (ref 140–400)
RBC: 5.04 10*6/uL (ref 3.80–5.10)
RDW: 12.7 % (ref 11.0–15.0)
Total Lymphocyte: 31.2 %
WBC: 4 10*3/uL (ref 3.8–10.8)

## 2021-04-21 LAB — BETA-2 GLYCOPROTEIN ANTIBODIES
Beta-2 Glyco 1 IgA: <2 U/mL
Beta-2 Glyco 1 IgM: <2 U/mL
Beta-2 Glyco I IgG: <2 U/mL

## 2021-04-21 LAB — URINALYSIS, ROUTINE W REFLEX MICROSCOPIC
Bilirubin Urine: NEGATIVE
Glucose, UA: NEGATIVE
Hgb urine dipstick: NEGATIVE
Ketones, ur: NEGATIVE
Leukocytes,Ua: NEGATIVE
Nitrite: NEGATIVE
Protein, ur: NEGATIVE
Specific Gravity, Urine: 1.016 (ref 1.001–1.035)
pH: 6 (ref 5.0–8.0)

## 2021-04-21 LAB — C3 AND C4
C3 Complement: 89 mg/dL (ref 83–193)
C4 Complement: 11 mg/dL — ABNORMAL LOW (ref 15–57)

## 2021-04-21 LAB — LUPUS ANTICOAGULANT EVAL W/ REFLEX
PTT-LA Screen: 36 s (ref <=40)
dRVVT: 32 s (ref <=45)

## 2021-04-21 LAB — CARDIOLIPIN ANTIBODIES, IGG, IGM, IGA
Anticardiolipin IgA: <2 APL-U/mL
Anticardiolipin IgG: <2 GPL-U/mL
Anticardiolipin IgM: <2 MPL-U/mL

## 2021-04-21 LAB — CYCLIC CITRUL PEPTIDE ANTIBODY, IGG: Cyclic Citrullin Peptide Ab: <16 UNITS

## 2021-04-21 LAB — ANA: Anti Nuclear Antibody (ANA): NEGATIVE

## 2021-04-21 LAB — ANTI-DNA ANTIBODY, DOUBLE-STRANDED: ds DNA Ab: 12 IU/mL — ABNORMAL HIGH

## 2021-04-21 LAB — GLUCOSE 6 PHOSPHATE DEHYDROGENASE: G-6PDH: 18.2 U/g Hgb (ref 7.0–20.5)

## 2021-04-21 LAB — CK: Total CK: 39 U/L (ref 29–143)

## 2021-04-21 LAB — 14-3-3 ETA PROTEIN: 14-3-3 eta Protein: <0.2 ng/mL (ref <0.2)

## 2021-04-21 LAB — ANTI-SCLERODERMA ANTIBODY: Scleroderma (Scl-70) (ENA) Antibody, IgG: <1 AI

## 2021-04-21 LAB — SJOGRENS SYNDROME-A EXTRACTABLE NUCLEAR ANTIBODY: SSA (Ro) (ENA) Antibody, IgG: <1 AI

## 2021-04-21 LAB — ANTI-SMITH ANTIBODY: ENA SM Ab Ser-aCnc: <1 AI

## 2021-04-21 LAB — SJOGRENS SYNDROME-B EXTRACTABLE NUCLEAR ANTIBODY: SSB (La) (ENA) Antibody, IgG: <1 AI

## 2021-04-21 LAB — RNP ANTIBODY: Ribonucleic Protein(ENA) Antibody, IgG: <1 AI

## 2021-04-23 NOTE — Progress Notes (Signed)
I will discuss results at the follow-up visit.

## 2021-04-24 NOTE — Progress Notes (Signed)
Office Visit Note  Patient: Stephanie [name]             Date of Birth: 02/09/1981           MRN: [id number]             PCP: Danelle Berry, NP Referring: Danelle Berry, NP Visit Date: 04/25/2021 Occupation: @GUAROCC @  Subjective:  Pain in multiple joints.   History of Present Illness: Stephanie [name] is a 41 y.o. female with history of poly algia.  She states she has been having increased pain and discomfort over the last 2 weeks.  She states she has pain and discomfort in her bilateral shoulders, hands and ankles.  She has not noticed any swelling.  She states she still gets photosensitivity and facial erythema when she is outside in the sun.  There is no history of oral ulcers, nasal ulcers, raynaud's phenominon, lymphadenopathy.  He continues to have fatigue and has been experiencing brain fog.  Activities of Daily Living:  Patient reports morning stiffness for 3-4 hours.   Patient Reports nocturnal pain.  Difficulty dressing/grooming: Denies Difficulty climbing stairs: Reports Difficulty getting out of chair: Reports Difficulty using hands for taps, buttons, cutlery, and/or writing: Reports  Review of Systems  Constitutional:  Positive for fatigue.  HENT:  Negative for mouth sores, mouth dryness and nose dryness.   Eyes:  Negative for pain, itching and dryness.  Respiratory:  Negative for shortness of breath and difficulty breathing.   Cardiovascular:  Negative for chest pain and palpitations.  Gastrointestinal:  Negative for blood in stool, constipation and diarrhea.  Endocrine: Negative for increased urination.  Genitourinary:  Negative for difficulty urinating.  Musculoskeletal:  Positive for joint pain, joint pain, myalgias, morning stiffness, muscle tenderness and myalgias. Negative for joint swelling.  Skin:  Positive for redness. Negative for color change, rash and sensitivity to sunlight.  Allergic/Immunologic: Positive for susceptible to  infections.  Neurological:  Positive for numbness and headaches. Negative for dizziness and memory loss.  Hematological:  Negative for bruising/bleeding tendency and swollen glands.  Psychiatric/Behavioral:  Negative for depressed mood, confusion and sleep disturbance. The patient is not nervous/anxious.    PMFS History:  Patient Active Problem List   Diagnosis Date Noted   Breast lump 09/15/2018   Chronic venous insufficiency 04/27/2017   Swelling of limb 04/27/2017    Past Medical History:  Diagnosis Date   Anxiety    history of   Anxiety disorder 04/12/2015   Headache(784.0)    SEVERE MIGRANES   PONV (postoperative nausea and vomiting)     Family History  Problem Relation Age of Onset   Anxiety disorder Mother    Cancer Father    Alcohol abuse Father    Kidney disease Father    Heart disease Father    Mental illness Sister    Heart disease Maternal Grandmother    Alcohol abuse Maternal Aunt    Mental illness Paternal Aunt    Breast cancer Neg Hx    Past Surgical History:  Procedure Laterality Date   KNEE ARTHROSCOPY      X 4 ON LEFT KNEE   KNEE ARTHROSCOPY W/ OATS PROCEDURE      X1 ON LEFT KNEE   MANDIBLE SURGERY     VAGINAL HYSTERECTOMY Bilateral 12/17/2016   Procedure: HYSTERECTOMY VAGINAL WITH BILATERAL SALPINGECTOMY;  Surgeon: Osborne Oman, MD;  Location: [facility];  Service: Gynecology;  Laterality: Bilateral;   WISDOM TOOTH EXTRACTION  ALL 4 REMOVE   Social History   Social History Narrative   Not on file   Immunization History  Administered Date(s) Administered   Influenza Split 01/31/2011   Influenza-Unspecified 12/16/2012   Tdap 01/26/2013     Objective: Vital Signs: BP (!) 136/105 (BP Location: Left Arm, Patient Position: Sitting, Cuff Size: Normal)    Pulse 79    Ht 5\' 4"  (1.626 m)    Wt 136 lb 6.4 oz (61.9 kg)    LMP 12/10/2016    BMI 23.41 kg/m    Physical Exam Vitals and nursing note reviewed.  Constitutional:      Appearance: She  is well-developed.  HENT:     Head: Normocephalic and atraumatic.  Eyes:     Conjunctiva/sclera: Conjunctivae normal.  Cardiovascular:     Rate and Rhythm: Normal rate and regular rhythm.     Heart sounds: Normal heart sounds.  Pulmonary:     Effort: Pulmonary effort is normal.     Breath sounds: Normal breath sounds.  Abdominal:     General: Bowel sounds are normal.     Palpations: Abdomen is soft.  Musculoskeletal:     Cervical back: Normal range of motion.  Lymphadenopathy:     Cervical: No cervical adenopathy.  Skin:    General: Skin is warm and dry.     Capillary Refill: Capillary refill takes less than 2 seconds.  Neurological:     Mental Status: She is alert and oriented to person, place, and time.  Psychiatric:        Behavior: Behavior normal.     Musculoskeletal Exam: C-spine was in good range of motion.  Shoulder joints, elbow joints, wrist joints with good range of motion.  She had painful range of motion of bilateral shoulders.  She had tenderness over bilateral wrist joints.  There was no synovitis over MCPs or PIPs.  Hip joints were in good range of motion.  Knee joints were in good range of motion.  She had tenderness over ankles and MTPs.  CDAI Exam: CDAI Score: 4.7  Patient Global: 5 mm; Provider Global: 2 mm Swollen: 0 ; Tender: 6  Joint Exam 04/25/2021      Right  Left  Glenohumeral   Tender   Tender  Wrist   Tender   Tender  Ankle   Tender   Tender     Investigation: No additional findings.  Imaging: XR Foot 2 Views Left  Result Date: 04/13/2021 No MCP, PIP or DIP narrowing was noted.  No intertarsal, tibiotalar or subtalar joint space narrowing was noted.  No erosive changes were noted. Impression: These findings are consistent with osteoarthritis of the foot.  XR Foot 2 Views Right  Result Date: 04/13/2021 No MCP, PIP or DIP narrowing was noted.  No intertarsal, tibiotalar or subtalar joint space narrowing was noted.  No erosive changes were  noted. Impression: These findings are consistent with osteoarthritis of the foot.  XR Hand 2 View Left  Result Date: 04/13/2021 Mild CMC, PIP narrowing was noted.  No MCP, intercarpal or radiocarpal joint space narrowing was noted.  No erosive changes were noted. Impression: These findings are consistent with early osteoarthritis of the hand.  XR Hand 2 View Right  Result Date: 04/13/2021 Mild CMC, PIP narrowing was noted.  No MCP, intercarpal or radiocarpal joint space narrowing was noted.  No erosive changes were noted. Impression: These findings are consistent with early osteoarthritis of the hand.  XR KNEE 3 VIEW LEFT  Result Date: 04/13/2021 Mild medial compartment narrowing was noted.  Moderate patellofemoral narrowing was noted. Impression: These findings are consistent with mild osteoarthritis and moderate chondromalacia patella.  XR KNEE 3 VIEW RIGHT  Result Date: 04/13/2021 No medial or lateral compartment narrowing was noted.  No patellofemoral narrowing was noted. Impression: Unremarkable x-ray of the knee.   Recent Labs: Lab Results  Component Value Date   WBC 4.0 04/13/2021   HGB 14.9 04/13/2021   PLT 172 04/13/2021   NA 139 04/13/2021   K 4.1 04/13/2021   CL 102 04/13/2021   CO2 31 04/13/2021   GLUCOSE 80 04/13/2021   BUN 15 04/13/2021   CREATININE 0.72 04/13/2021   BILITOT 0.6 04/13/2021   ALKPHOS 54 12/02/2017   AST 20 04/13/2021   ALT 12 04/13/2021   PROT 7.4 04/13/2021   ALBUMIN 4.0 12/02/2017   CALCIUM 9.4 04/13/2021   GFRAA 117 12/02/2017   04/13/2021 UA negative, ANA -, ds 12,(Ro-,La-,Sm-,RNP-,Scl 70-),CCP Ab-, 14-3-3 n negative,C 3 negative, C4 11 (L), aCL -, b 2GP1 -, LA-, CK 39, G6PD negative  Speciality Comments: No specialty comments available.  Procedures:  No procedures performed Allergies: Sulfa antibiotics, Gluten meal, and Shellfish allergy   Assessment / Plan:     Visit Diagnoses: Undifferentiated connective tissue disease ([place]) -  Repeat ANA negative, dsDNA positive, C4 low, history of facial erythema, arthralgias.  She has been experiencing increased pain and discomfort over the last week.  She states the pain was intense about a week ago which is gradually improving.  But she is a still in significant pain.  She has difficulty raising her arm.  She is also stiffness in her hands and pain in her wrist joints.  She had tenderness in multiple joints as described above.  I had a detailed discussion with the patient.  We discussed the option of starting her on hydroxychloroquine.  Indications side effects contraindications were discussed at length.  She wants to proceed with hydroxychloroquine.  Handout was given and consent was taken.  Her dose will be hydroxychloroquine 200 mg p.o. twice daily Monday to Friday.  Patient was counseled on the purpose, proper use, and adverse effects of hydroxychloroquine including nausea/diarrhea, skin rash, headaches, and sun sensitivity.  Advised patient to wear sunscreen once starting hydroxychloroquine to reduce risk of rash associated with sun sensitivity.  Discussed importance of annual eye exams while on hydroxychloroquine to monitor to ocular toxicity and discussed importance of frequent laboratory monitoring.  Provided patient with eye exam form for baseline ophthalmologic exam.  Reviewed risk for QTC prolongation when used in combination with other QTc prolonging agents (including but not limited to antiarrhythmics, macrolide antibiotics, flouroquinolones, tricyclic antidepressants, citalopram, specific antipsychotics, ondansetron, migraine triptans, and methadone). Provided patient with educational materials on hydroxychloroquine and answered all questions.  Patient consented to hydroxychloroquine. Will upload consent in the media tab.    High risk medication use-she will get labs in a month, 3 months and then every 5 months.  She will also get baseline eye exam and then yearly eye examination to  monitor for ocular toxicity.  Use of sunscreen was emphasized.  Information regarding medication was also placed in the AVS.    Other fatigue-she has been experiencing increased fatigue.  Pain in both hands -she continues to have pain and discomfort in her bilateral wrist joints in her hands.  She gives history of significant morning stiffness.  She had tenderness on palpation over PIPs and wrist joints.  No synovitis was  noted.  X-rays showed mild osteoarthritic changes.  X-ray findings were discussed with the patient.  Chronic pain of both knees - History of pain in bilateral knee joints.  Right knee joint x-rays were unremarkable.  Left knee joint showed mild osteoarthritis and moderate chondromalacia patella.  X-ray findings were discussed with the patient.  No warmth swelling or effusion was noted.  Pain in both feet - History of pain in bilateral feet.  She had tenderness over ankles.  No synovitis was noted.  X-rays were unremarkable.  X-ray findings were discussed with the patient.  Elevated blood pressure reading-patient states she is very anxious and that is causing her blood pressure to go up.  Have advised her to monitor blood pressure closely.  If her blood pressure stays elevated she may need treatment.  History of hyperlipidemia-dietary modifications were discussed.  Chronic venous insufficiency  Hx of migraines  Family history of rheumatoid arthritis - Maternal grandmother and maternal great aunt  Family history of celiac disease - Daughter.  Orders: Orders Placed This Encounter  Procedures   CBC with Differential/Platelet   COMPLETE METABOLIC PANEL WITH GFR   Meds ordered this encounter  Medications   hydroxychloroquine (PLAQUENIL) 200 MG tablet    Sig: Take 1 tablet twice daily Monday through Friday. Do not take on Saturdays and Sundays.    Dispense:  120 tablet    Refill:  0     Follow-Up Instructions: Return in about 6 weeks (around 06/06/2021).   Bo Merino, MD  Note - This record has been created using Editor, commissioning.  Chart creation errors have been sought, but may not always  have been located. Such creation errors do not reflect on  the standard of medical care.

## 2021-04-25 ENCOUNTER — Encounter: Payer: Self-pay | Admitting: Rheumatology

## 2021-04-25 ENCOUNTER — Ambulatory Visit (INDEPENDENT_AMBULATORY_CARE_PROVIDER_SITE_OTHER): Payer: BC Managed Care – PPO | Admitting: Rheumatology

## 2021-04-25 ENCOUNTER — Other Ambulatory Visit: Payer: Self-pay

## 2021-04-25 VITALS — BP 136/105 | HR 79 | Ht 64.0 in | Wt 136.4 lb

## 2021-04-25 DIAGNOSIS — Z79899 Other long term (current) drug therapy: Secondary | ICD-10-CM

## 2021-04-25 DIAGNOSIS — Z8669 Personal history of other diseases of the nervous system and sense organs: Secondary | ICD-10-CM

## 2021-04-25 DIAGNOSIS — M359 Systemic involvement of connective tissue, unspecified: Secondary | ICD-10-CM | POA: Diagnosis not present

## 2021-04-25 DIAGNOSIS — M79672 Pain in left foot: Secondary | ICD-10-CM

## 2021-04-25 DIAGNOSIS — Z8379 Family history of other diseases of the digestive system: Secondary | ICD-10-CM

## 2021-04-25 DIAGNOSIS — M79671 Pain in right foot: Secondary | ICD-10-CM

## 2021-04-25 DIAGNOSIS — R5383 Other fatigue: Secondary | ICD-10-CM

## 2021-04-25 DIAGNOSIS — Z8261 Family history of arthritis: Secondary | ICD-10-CM

## 2021-04-25 DIAGNOSIS — M79641 Pain in right hand: Secondary | ICD-10-CM

## 2021-04-25 DIAGNOSIS — M25562 Pain in left knee: Secondary | ICD-10-CM

## 2021-04-25 DIAGNOSIS — Z5181 Encounter for therapeutic drug level monitoring: Secondary | ICD-10-CM

## 2021-04-25 DIAGNOSIS — R768 Other specified abnormal immunological findings in serum: Secondary | ICD-10-CM

## 2021-04-25 DIAGNOSIS — M79642 Pain in left hand: Secondary | ICD-10-CM

## 2021-04-25 DIAGNOSIS — G8929 Other chronic pain: Secondary | ICD-10-CM

## 2021-04-25 DIAGNOSIS — M25561 Pain in right knee: Secondary | ICD-10-CM | POA: Diagnosis not present

## 2021-04-25 DIAGNOSIS — I872 Venous insufficiency (chronic) (peripheral): Secondary | ICD-10-CM

## 2021-04-25 DIAGNOSIS — Z8639 Personal history of other endocrine, nutritional and metabolic disease: Secondary | ICD-10-CM

## 2021-04-25 DIAGNOSIS — R030 Elevated blood-pressure reading, without diagnosis of hypertension: Secondary | ICD-10-CM

## 2021-04-25 MED ORDER — HYDROXYCHLOROQUINE SULFATE 200 MG PO TABS: ORAL_TABLET | ORAL | 0 refills | Status: AC

## 2021-04-25 NOTE — Progress Notes (Signed)
Pharmacy Note  Subjective: Patient presents today to [facility] for follow up office visit.   Patient seen by the pharmacist for counseling on hydroxychloroquine for mixed connective tissue disease.  Objective: CMP     Component Value Date/Time   NA 139 04/13/2021 0908   NA 141 12/02/2017 0825   K 4.1 04/13/2021 0908   CL 102 04/13/2021 0908   CO2 31 04/13/2021 0908   GLUCOSE 80 04/13/2021 0908   BUN 15 04/13/2021 0908   BUN 10 12/02/2017 0825   CREATININE 0.72 04/13/2021 0908   CALCIUM 9.4 04/13/2021 0908   PROT 7.4 04/13/2021 0908   PROT 6.7 12/02/2017 0825   ALBUMIN 4.0 12/02/2017 0825   AST 20 04/13/2021 0908   ALT 12 04/13/2021 0908   ALKPHOS 54 12/02/2017 0825   BILITOT 0.6 04/13/2021 0908   BILITOT 0.4 12/02/2017 0825   GFRNONAA 101 12/02/2017 0825   GFRAA 117 12/02/2017 0825    CBC    Component Value Date/Time   WBC 4.0 04/13/2021 0908   RBC 5.04 04/13/2021 0908   HGB 14.9 04/13/2021 0908   HGB 13.2 12/02/2017 0825   HCT 43.8 04/13/2021 0908   HCT 38.9 12/02/2017 0825   PLT 172 04/13/2021 0908   PLT 172 12/02/2017 0825   MCV 86.9 04/13/2021 0908   MCV 84 12/02/2017 0825   MCH 29.6 04/13/2021 0908   MCHC 34.0 04/13/2021 0908   RDW 12.7 04/13/2021 0908   RDW 12.8 12/02/2017 0825   LYMPHSABS 1,248 04/13/2021 0908   MONOABS 0.4 12/06/2010 1604   EOSABS 0 (L) 04/13/2021 0908   BASOSABS 28 04/13/2021 0908    Assessment/Plan: Patient was counseled on the purpose, proper use, and adverse effects of hydroxychloroquine including nausea/diarrhea, skin rash, headaches, and sun sensitivity.  Advised patient to wear sunscreen once starting hydroxychloroquine to reduce risk of rash associated with sun sensitivity.  Discussed importance of annual eye exams while on hydroxychloroquine to monitor to ocular toxicity and discussed importance of frequent laboratory monitoring.  Provided patient with eye exam form for baseline ophthalmologic exam.  Reviewed risk  for QTC prolongation when used in combination with other QTc prolonging agents (including but not limited to antiarrhythmics, macrolide antibiotics, flouroquinolones, tricyclic antidepressants, citalopram, specific antipsychotics, ondansetron, migraine triptans, and methadone). Provided patient with educational materials on hydroxychloroquine and answered all questions.  Patient consented to hydroxychloroquine. Will upload consent in the media tab.    Dose will be Plaquenil 200 mg twice daily Monday through Friday.  Prescription sent to [facility] today.  Standing orders for CBC, CMP placed today. Patient is to return for labs in 1 month, 3 months, then every 5 months.  Knox Saliva, PharmD, MPH, BCPS Clinical Pharmacist (Rheumatology and Pulmonology)

## 2021-04-25 NOTE — Patient Instructions (Addendum)
Standing Labs We placed an order today for your standing lab work.   Please have your standing labs drawn in 1 month, 3 months, then every 5 months  If possible, please have your labs drawn 2 weeks prior to your appointment so that the provider can discuss your results at your appointment.  Please note that you may see your imaging and lab results in [place] before we have reviewed them. We may be awaiting multiple results to interpret others before contacting you. Please allow our office up to 72 hours to thoroughly review all of the results before contacting the office for clarification of your results.  We have open lab daily: Monday through Thursday from 1:30-4:30 PM and Friday from 1:30-4:00 PM at the office of Dr. Bo Merino, [facility].   Please be advised, all patients with office appointments requiring lab work will take precedent over walk-in lab work.  If possible, please come for your lab work on Monday and Friday afternoons, as you may experience shorter wait times. The office is located at [address] [place], [place], [place] [address] No appointment is necessary.   Labs are drawn by [facility]. Please bring your co-pay at the time of your lab draw.  You may receive a bill from [place] for your lab work.  Please note if you are on Hydroxychloroquine and and an order has been placed for a Hydroxychloroquine level, you will need to have it drawn 4 hours or more after your last dose.  If you wish to have your labs drawn at another location, please call the office 24 hours in advance to send orders.  If you have any questions regarding directions or hours of operation,  please call [phone].   As a reminder, please drink plenty of water prior to coming for your lab work. Thanks!   Hydroxychloroquine Tablets What is this medication? HYDROXYCHLOROQUINE (hye drox ee KLOR oh kwin) treats autoimmune conditions, such as rheumatoid arthritis and lupus. It  works by slowing down an overactive immune system. It may also be used to prevent and treat malaria. It works by killing the parasite that causes malaria. It belongs to a group of medications called DMARDs. This medicine may be used for other purposes; ask your health care provider or pharmacist if you have questions. COMMON BRAND NAME(S): Plaquenil, Quineprox What should I tell my care team before I take this medication? They need to know if you have any of these conditions: Diabetes Eye disease, vision problems G6PD deficiency Heart disease History of irregular heartbeat If you often drink alcohol Kidney disease Liver disease Porphyria Psoriasis An unusual or allergic reaction to chloroquine, hydroxychloroquine, other medications, foods, dyes, or preservatives Pregnant or trying to get pregnant Breast-feeding How should I use this medication? Take this medication by mouth with a glass of water. Take it as directed on the prescription label. Do not cut, crush or chew this medication. Swallow the tablets whole. Take it with food. Do not take it more than directed. Take all of this medication unless your care team tells you to stop it early. Keep taking it even if you think you are better. Take products with antacids in them at a different time of day than this medication. Take this medication 4 hours before or 4 hours after antacids. Talk to your care team if you have questions. Talk to your care team about the use of this medication in children. While this medication may be prescribed for selected conditions, precautions do  apply. Overdosage: If you think you have taken too much of this medicine contact a poison control center or emergency room at once. NOTE: This medicine is only for you. Do not share this medicine with others. What if I miss a dose? If you miss a dose, take it as soon as you can. If it is almost time for your next dose, take only that dose. Do not take double or extra  doses. What may interact with this medication? Do not take this medication with any of the following: Cisapride Dronedarone Pimozide Thioridazine This medication may also interact with the following: Ampicillin Antacids Cimetidine Cyclosporine Digoxin Kaolin Medications for diabetes, like insulin, glipizide, glyburide Medications for seizures like carbamazepine, phenobarbital, phenytoin Mefloquine Methotrexate Other medications that prolong the QT interval (cause an abnormal heart rhythm) Praziquantel This list may not describe all possible interactions. Give your health care provider a list of all the medicines, herbs, non-prescription drugs, or dietary supplements you use. Also tell them if you smoke, drink alcohol, or use illegal drugs. Some items may interact with your medicine. What should I watch for while using this medication? Visit your care team for regular checks on your progress. Tell your care team if your symptoms do not start to get better or if they get worse. You may need blood work done while you are taking this medication. If you take other medications that can affect heart rhythm, you may need more testing. Talk to your care team if you have questions. Your vision may be tested before and during use of this medication. Tell your care team right away if you have any change in your eyesight. This medication may cause serious skin reactions. They can happen weeks to months after starting the medication. Contact your care team right away if you notice fevers or flu-like symptoms with a rash. The rash may be red or purple and then turn into blisters or peeling of the skin. Or, you might notice a red rash with swelling of the face, lips or lymph nodes in your neck or under your arms. If you or your family notice any changes in your behavior, such as new or worsening depression, thoughts of harming yourself, anxiety, or other unusual or disturbing thoughts, or memory loss, call  your care team right away. What side effects may I notice from receiving this medication? Side effects that you should report to your care team as soon as possible: Allergic reactions--skin rash, itching, hives, swelling of the face, lips, tongue, or throat Aplastic anemia--unusual weakness or fatigue, dizziness, headache, trouble breathing, increased bleeding or bruising Change in vision Heart rhythm changes--fast or irregular heartbeat, dizziness, feeling faint or lightheaded, chest pain, trouble breathing Infection--fever, chills, cough, or sore throat Low blood sugar (hypoglycemia)--tremors or shaking, anxiety, sweating, cold or clammy skin, confusion, dizziness, rapid heartbeat Muscle injury--unusual weakness or fatigue, muscle pain, dark yellow or brown urine, decrease in amount of urine Pain, tingling, or numbness in the hands or feet Rash, fever, and swollen lymph nodes Redness, blistering, peeling, or loosening of the skin, including inside the mouth Thoughts of suicide or self-harm, worsening mood, or feelings of depression Unusual bruising or bleeding Side effects that usually do not require medical attention (report to your care team if they continue or are bothersome): Diarrhea Headache Nausea Stomach pain Vomiting This list may not describe all possible side effects. Call your doctor for medical advice about side effects. You may report side effects to FDA at 1-800-FDA-1088. Where should I  keep my medication? Keep out of the reach of children and pets. Store at room temperature up to 30 degrees C (86 degrees F). Protect from light. Get rid of any unused medication after the expiration date. To get rid of medications that are no longer needed or have expired: Take the medication to a medication take-back program. Check with your pharmacy or law enforcement to find a location. If you cannot return the medication, check the label or package insert to see if the medication should  be thrown out in the garbage or flushed down the toilet. If you are not sure, ask your care team. If it is safe to put it in the trash, empty the medication out of the container. Mix the medication with cat litter, dirt, coffee grounds, or other unwanted substance. Seal the mixture in a bag or container. Put it in the trash. NOTE: This sheet is a summary. It may not cover all possible information. If you have questions about this medicine, talk to your doctor, pharmacist, or health care provider.  2022 Elsevier/Gold Standard (2020-07-20 00:00:00)  Standing Labs We placed an order today for your standing lab work.   Please have your standing labs drawn in 1 month, 3 months and then every 5 months  If possible, please have your labs drawn 2 weeks prior to your appointment so that the provider can discuss your results at your appointment.  Please note that you may see your imaging and lab results in [place] before we have reviewed them. We may be awaiting multiple results to interpret others before contacting you. Please allow our office up to 72 hours to thoroughly review all of the results before contacting the office for clarification of your results.  We have open lab daily: Monday through Thursday from 1:30-4:30 PM and Friday from 1:30-4:00 PM at the office of Dr. Bo Merino, [facility].   Please be advised, all patients with office appointments requiring lab work will take precedent over walk-in lab work.  If possible, please come for your lab work on Monday and Friday afternoons, as you may experience shorter wait times. The office is located at [address] [place], [name], [place] [address] No appointment is necessary.   Labs are drawn by [facility]. Please bring your co-pay at the time of your lab draw.  You may receive a bill from [place] for your lab work.  Please note if you are on Hydroxychloroquine and and an order has been placed for a Hydroxychloroquine  level, you will need to have it drawn 4 hours or more after your last dose.  If you wish to have your labs drawn at another location, please call the office 24 hours in advance to send orders.  If you have any questions regarding directions or hours of operation,  please call [phone].   As a reminder, please drink plenty of water prior to coming for your lab work. Thanks!   Vaccines You are taking a medication(s) that can suppress your immune system.  The following immunizations are recommended: Flu annually Covid-19  Td/Tdap (tetanus, diphtheria, pertussis) every 10 years Pneumonia (Prevnar 15 then Pneumovax 23 at least 1 year apart.  Alternatively, can take Prevnar 20 without needing additional dose) Shingrix: 2 doses from 4 weeks to 6 months apart  Please check with your PCP to make sure you are up to date.   If you have signs or symptoms of an infection or start antibiotics: First, call your PCP for workup  of your infection. Hold your medication through the infection, until you complete your antibiotics, and until symptoms resolve if you take the following: Injectable medication (Actemra, Benlysta, Cimzia, Cosentyx, Enbrel, Humira, Kevzara, Orencia, Remicade, Simponi, Stelara, Taltz, Tremfya) Methotrexate Leflunomide (Arava) Mycophenolate (Cellcept) Morrie Sheldon, Olumiant, or Rinvoq

## 2021-05-03 ENCOUNTER — Ambulatory Visit: Payer: BC Managed Care – PPO | Admitting: Rheumatology

## 2021-05-25 NOTE — Progress Notes (Deleted)
? ?Office Visit Note ? ?Patient: Stephanie [name]             ?Date of Birth: 09/11/1980           ?MRN: [id number]             ?PCP: Danelle Berry, NP ?Referring: Danelle Berry, NP ?Visit Date: 06/08/2021 ?Occupation: '@GUAROCC'$ @ ? ?Subjective:  ?No chief complaint on file. ? ? ?History of Present Illness: Stephanie [name] is a 41 y.o. female ***  ? ?Activities of Daily Living:  ?Patient reports morning stiffness for *** {minute/hour:19697}.   ?Patient {ACTIONS;DENIES/REPORTS:21021675::"Denies"} nocturnal pain.  ?Difficulty dressing/grooming: {ACTIONS;DENIES/REPORTS:21021675::"Denies"} ?Difficulty climbing stairs: {ACTIONS;DENIES/REPORTS:21021675::"Denies"} ?Difficulty getting out of chair: {ACTIONS;DENIES/REPORTS:21021675::"Denies"} ?Difficulty using hands for taps, buttons, cutlery, and/or writing: {ACTIONS;DENIES/REPORTS:21021675::"Denies"} ? ?No Rheumatology ROS completed.  ? ?PMFS History:  ?Patient Active Problem List  ? Diagnosis Date Noted  ? Breast lump 09/15/2018  ? Chronic venous insufficiency 04/27/2017  ? Swelling of limb 04/27/2017  ?  ?Past Medical History:  ?Diagnosis Date  ? Anxiety   ? history of  ? Anxiety disorder 04/12/2015  ? Headache(784.0)   ? SEVERE MIGRANES  ? PONV (postoperative nausea and vomiting)   ?  ?Family History  ?Problem Relation Age of Onset  ? Anxiety disorder Mother   ? Cancer Father   ? Alcohol abuse Father   ? Kidney disease Father   ? Heart disease Father   ? Mental illness Sister   ? Heart disease Maternal Grandmother   ? Alcohol abuse Maternal Aunt   ? Mental illness Paternal Aunt   ? Breast cancer Neg Hx   ? ?Past Surgical History:  ?Procedure Laterality Date  ? KNEE ARTHROSCOPY    ?  X 4 ON LEFT KNEE  ? KNEE ARTHROSCOPY W/ OATS PROCEDURE    ?  X1 ON LEFT KNEE  ? MANDIBLE SURGERY    ? VAGINAL HYSTERECTOMY Bilateral 12/17/2016  ? Procedure: HYSTERECTOMY VAGINAL WITH BILATERAL SALPINGECTOMY;  Surgeon: Osborne Oman, MD;  Location: [facility];   Service: Gynecology;  Laterality: Bilateral;  ? WISDOM TOOTH EXTRACTION    ?  ALL 4 REMOVE  ? ?Social History  ? ?Social History Narrative  ? Not on file  ? ?Immunization History  ?Administered Date(s) Administered  ? Influenza Split 01/31/2011  ? Influenza-Unspecified 12/16/2012  ? Tdap 01/26/2013  ?  ? ?Objective: ?Vital Signs: LMP 12/10/2016   ? ?Physical Exam  ? ?Musculoskeletal Exam: *** ? ?CDAI Exam: ?CDAI Score: -- ?Patient Global: --; Provider Global: -- ?Swollen: --; Tender: -- ?Joint Exam 06/08/2021  ? ?No joint exam has been documented for this visit  ? ?There is currently no information documented on the homunculus. Go to the Rheumatology activity and complete the homunculus joint exam. ? ?Investigation: ?No additional findings. ? ?Imaging: ?No results found. ? ?Recent Labs: ?Lab Results  ?Component Value Date  ? WBC 4.0 04/13/2021  ? HGB 14.9 04/13/2021  ? PLT 172 04/13/2021  ? NA 139 04/13/2021  ? K 4.1 04/13/2021  ? CL 102 04/13/2021  ? CO2 31 04/13/2021  ? GLUCOSE 80 04/13/2021  ? BUN 15 04/13/2021  ? CREATININE 0.72 04/13/2021  ? BILITOT 0.6 04/13/2021  ? ALKPHOS 54 12/02/2017  ? AST 20 04/13/2021  ? ALT 12 04/13/2021  ? PROT 7.4 04/13/2021  ? ALBUMIN 4.0 12/02/2017  ? CALCIUM 9.4 04/13/2021  ? GFRAA 117 12/02/2017  ? ? ?Speciality Comments: No specialty comments available. ? ?Procedures:  ?No procedures performed ?Allergies: Sulfa  antibiotics, Gluten meal, and Shellfish allergy  ? ?Assessment / Plan:     ?Visit Diagnoses: Undifferentiated connective tissue disease ([place]) ? ?Other fatigue ? ?Pain in both hands ? ?Chronic pain of both knees ? ?Pain in both feet ? ?History of hyperlipidemia ? ?Chronic venous insufficiency ? ?Hx of migraines ? ?Family history of rheumatoid arthritis ? ?Family history of celiac disease ? ?Elevated blood pressure reading ? ?Orders: ?No orders of the defined types were placed in this encounter. ? ?No orders of the defined types were placed in this  encounter. ? ? ?Face-to-face time spent with patient was *** minutes. Greater than 50% of time was spent in counseling and coordination of care. ? ?Follow-Up Instructions: No follow-ups on file. ? ? ?Ofilia Neas, PA-C ? ?Note - This record has been created using [employer].  ?Chart creation errors have been sought, but may not always  ?have been located. Such creation errors do not reflect on  ?the standard of medical care. ? ?

## 2021-06-08 ENCOUNTER — Ambulatory Visit: Payer: BC Managed Care – PPO | Admitting: Rheumatology

## 2021-06-08 ENCOUNTER — Ambulatory Visit: Payer: BC Managed Care – PPO | Admitting: Physician Assistant

## 2021-06-08 DIAGNOSIS — M79671 Pain in right foot: Secondary | ICD-10-CM

## 2021-06-08 DIAGNOSIS — I872 Venous insufficiency (chronic) (peripheral): Secondary | ICD-10-CM

## 2021-06-08 DIAGNOSIS — Z8379 Family history of other diseases of the digestive system: Secondary | ICD-10-CM

## 2021-06-08 DIAGNOSIS — G8929 Other chronic pain: Secondary | ICD-10-CM

## 2021-06-08 DIAGNOSIS — Z8669 Personal history of other diseases of the nervous system and sense organs: Secondary | ICD-10-CM

## 2021-06-08 DIAGNOSIS — Z79899 Other long term (current) drug therapy: Secondary | ICD-10-CM

## 2021-06-08 DIAGNOSIS — M359 Systemic involvement of connective tissue, unspecified: Secondary | ICD-10-CM

## 2021-06-08 DIAGNOSIS — M79642 Pain in left hand: Secondary | ICD-10-CM

## 2021-06-08 DIAGNOSIS — M79672 Pain in left foot: Secondary | ICD-10-CM

## 2021-06-08 DIAGNOSIS — Z8639 Personal history of other endocrine, nutritional and metabolic disease: Secondary | ICD-10-CM

## 2021-06-08 DIAGNOSIS — M79641 Pain in right hand: Secondary | ICD-10-CM

## 2021-06-08 DIAGNOSIS — R5383 Other fatigue: Secondary | ICD-10-CM

## 2021-06-08 DIAGNOSIS — Z8261 Family history of arthritis: Secondary | ICD-10-CM

## 2021-06-08 DIAGNOSIS — R030 Elevated blood-pressure reading, without diagnosis of hypertension: Secondary | ICD-10-CM

## 2021-07-26 ENCOUNTER — Ambulatory Visit
Admission: RE | Admit: 2021-07-26 | Discharge: 2021-07-26 | Disposition: A | Discharge status: Home / Self Care | Payer: BC Managed Care – PPO | Source: Ambulatory Visit | Attending: Obstetrics & Gynecology | Admitting: Obstetrics & Gynecology

## 2021-07-26 DIAGNOSIS — Z1231 Encounter for screening mammogram for malignant neoplasm of breast: Secondary | ICD-10-CM | POA: Diagnosis present

## 2021-07-27 ENCOUNTER — Other Ambulatory Visit: Payer: Self-pay | Admitting: Obstetrics & Gynecology

## 2021-07-27 DIAGNOSIS — R928 Other abnormal and inconclusive findings on diagnostic imaging of breast: Secondary | ICD-10-CM

## 2021-08-15 ENCOUNTER — Ambulatory Visit
Admission: RE | Admit: 2021-08-15 | Discharge: 2021-08-15 | Disposition: A | Discharge status: Home / Self Care | Payer: BC Managed Care – PPO | Source: Ambulatory Visit | Attending: Obstetrics & Gynecology | Admitting: Obstetrics & Gynecology

## 2021-08-15 DIAGNOSIS — R928 Other abnormal and inconclusive findings on diagnostic imaging of breast: Secondary | ICD-10-CM

## 2021-09-03 DIAGNOSIS — R5383 Other fatigue: Secondary | ICD-10-CM | POA: Diagnosis not present

## 2021-09-03 DIAGNOSIS — E782 Mixed hyperlipidemia: Secondary | ICD-10-CM | POA: Diagnosis not present

## 2021-09-11 DIAGNOSIS — E782 Mixed hyperlipidemia: Secondary | ICD-10-CM | POA: Diagnosis not present

## 2021-09-11 DIAGNOSIS — M3210 Systemic lupus erythematosus, organ or system involvement unspecified: Secondary | ICD-10-CM | POA: Diagnosis not present

## 2021-09-11 DIAGNOSIS — R5383 Other fatigue: Secondary | ICD-10-CM | POA: Diagnosis not present

## 2021-09-11 DIAGNOSIS — G43009 Migraine without aura, not intractable, without status migrainosus: Secondary | ICD-10-CM | POA: Diagnosis not present

## 2021-10-02 DIAGNOSIS — Z79899 Other long term (current) drug therapy: Secondary | ICD-10-CM | POA: Diagnosis not present

## 2021-11-07 DIAGNOSIS — M2550 Pain in unspecified joint: Secondary | ICD-10-CM | POA: Diagnosis not present

## 2021-11-07 DIAGNOSIS — M329 Systemic lupus erythematosus, unspecified: Secondary | ICD-10-CM | POA: Diagnosis not present

## 2021-11-21 DIAGNOSIS — M329 Systemic lupus erythematosus, unspecified: Secondary | ICD-10-CM | POA: Diagnosis not present

## 2021-11-21 DIAGNOSIS — D696 Thrombocytopenia, unspecified: Secondary | ICD-10-CM | POA: Diagnosis not present

## 2021-12-05 DIAGNOSIS — R0602 Shortness of breath: Secondary | ICD-10-CM | POA: Diagnosis not present

## 2021-12-05 DIAGNOSIS — R079 Chest pain, unspecified: Secondary | ICD-10-CM | POA: Diagnosis not present

## 2021-12-05 DIAGNOSIS — R5383 Other fatigue: Secondary | ICD-10-CM | POA: Diagnosis not present

## 2021-12-05 DIAGNOSIS — E782 Mixed hyperlipidemia: Secondary | ICD-10-CM | POA: Diagnosis not present

## 2021-12-26 DIAGNOSIS — M329 Systemic lupus erythematosus, unspecified: Secondary | ICD-10-CM | POA: Diagnosis not present

## 2022-01-03 DIAGNOSIS — D1730 Benign lipomatous neoplasm of skin and subcutaneous tissue of unspecified sites: Secondary | ICD-10-CM | POA: Diagnosis not present

## 2022-01-03 DIAGNOSIS — M329 Systemic lupus erythematosus, unspecified: Secondary | ICD-10-CM | POA: Diagnosis not present

## 2022-01-24 DIAGNOSIS — M2550 Pain in unspecified joint: Secondary | ICD-10-CM | POA: Diagnosis not present

## 2022-01-24 DIAGNOSIS — Z79899 Other long term (current) drug therapy: Secondary | ICD-10-CM | POA: Diagnosis not present

## 2022-01-24 DIAGNOSIS — M329 Systemic lupus erythematosus, unspecified: Secondary | ICD-10-CM | POA: Diagnosis not present

## 2022-01-31 DIAGNOSIS — D1723 Benign lipomatous neoplasm of skin and subcutaneous tissue of right leg: Secondary | ICD-10-CM | POA: Diagnosis not present

## 2022-02-05 ENCOUNTER — Other Ambulatory Visit: Payer: Self-pay | Admitting: Surgery

## 2022-02-05 DIAGNOSIS — D1723 Benign lipomatous neoplasm of skin and subcutaneous tissue of right leg: Secondary | ICD-10-CM

## 2022-02-12 ENCOUNTER — Ambulatory Visit
Admission: RE | Admit: 2022-02-12 | Discharge: 2022-02-12 | Disposition: A | Discharge status: Home / Self Care | Payer: BC Managed Care – PPO | Source: Ambulatory Visit | Attending: Surgery | Admitting: Surgery

## 2022-02-12 DIAGNOSIS — D1723 Benign lipomatous neoplasm of skin and subcutaneous tissue of right leg: Secondary | ICD-10-CM

## 2022-02-28 DIAGNOSIS — M329 Systemic lupus erythematosus, unspecified: Secondary | ICD-10-CM | POA: Diagnosis not present

## 2022-03-07 DIAGNOSIS — D696 Thrombocytopenia, unspecified: Secondary | ICD-10-CM | POA: Diagnosis not present

## 2022-03-07 DIAGNOSIS — Z79899 Other long term (current) drug therapy: Secondary | ICD-10-CM | POA: Diagnosis not present

## 2022-03-07 DIAGNOSIS — M329 Systemic lupus erythematosus, unspecified: Secondary | ICD-10-CM | POA: Diagnosis not present

## 2022-03-08 ENCOUNTER — Ambulatory Visit
Admission: RE | Admit: 2022-03-08 | Discharge: 2022-03-08 | Disposition: A | Discharge status: Home / Self Care | Payer: BC Managed Care – PPO | Source: Ambulatory Visit

## 2022-03-08 VITALS — BP 121/84 | HR 88 | Temp 98.1°F | Resp 16

## 2022-03-08 DIAGNOSIS — J209 Acute bronchitis, unspecified: Secondary | ICD-10-CM

## 2022-03-08 DIAGNOSIS — J22 Unspecified acute lower respiratory infection: Secondary | ICD-10-CM | POA: Diagnosis not present

## 2022-03-08 MED ORDER — AZITHROMYCIN 250 MG PO TABS: 250.0000 mg | ORAL_TABLET | Freq: Every day | ORAL | 0 refills | Status: DC

## 2022-03-08 MED ORDER — HYDROCOD POLI-CHLORPHE POLI ER 10-8 MG/5ML PO SUER: 5.0000 mL | Freq: Two times a day (BID) | ORAL | 0 refills | Status: AC | PRN

## 2022-03-08 MED ORDER — BENZONATATE 100 MG PO CAPS: ORAL_CAPSULE | ORAL | 0 refills | Status: DC

## 2022-03-08 NOTE — ED Provider Notes (Signed)
[name]    CSN: [id number] Arrival date & time: 03/08/22  1848      History   Chief Complaint Chief Complaint  Patient presents with   Cough   Sore Throat   Fever    HPI Stephanie [name] is a 41 y.o. female.    Cough Associated symptoms: fever   Sore Throat  Fever Associated symptoms: cough     Presents to urgent care with complaint of fever, sore throat, cough starting last night.  Patient states she was diagnosed and treated for flu last week with tamiflu and symptoms resolved until similar symptoms again last night.  History of lupus and currently on immunomodulators.  Past Medical History:  Diagnosis Date   Anxiety    history of   Anxiety disorder 04/12/2015   Headache(784.0)    SEVERE MIGRANES   PONV (postoperative nausea and vomiting)     Patient Active Problem List   Diagnosis Date Noted   Breast lump 09/15/2018   Chronic venous insufficiency 04/27/2017   Swelling of limb 04/27/2017    Past Surgical History:  Procedure Laterality Date   KNEE ARTHROSCOPY      X 4 ON LEFT KNEE   KNEE ARTHROSCOPY W/ OATS PROCEDURE      X1 ON LEFT KNEE   MANDIBLE SURGERY     VAGINAL HYSTERECTOMY Bilateral 12/17/2016   Procedure: HYSTERECTOMY VAGINAL WITH BILATERAL SALPINGECTOMY;  Surgeon: Osborne Oman, MD;  Location: [facility];  Service: Gynecology;  Laterality: Bilateral;   WISDOM TOOTH EXTRACTION      ALL 4 REMOVE    OB History     Gravida  1   Para  1   Term  1   Preterm  0   AB  0   Living  1      SAB  0   IAB  0   Ectopic  0   Multiple  0   Live Births  1            Home Medications    Prior to Admission medications   Medication Sig Start Date End Date Taking? Authorizing Provider  oseltamivir (TAMIFLU) 75 MG capsule Take 75 mg by mouth 2 (two) times daily. 03/01/22  Yes [provider]  predniSONE (DELTASONE) 50 MG tablet Take 50 mg by mouth every morning. 09/20/21  Yes [provider]   Ascorbic Acid (VITAMIN C) 500 MG CAPS     [provider]  Bacillus Coagulans-Inulin (PROBIOTIC) 1-250 BILLION-MG CAPS     [provider]  Elderberry 500 MG CAPS  01/26/21   [provider]  hydroxychloroquine (PLAQUENIL) 200 MG tablet Take 1 tablet twice daily Monday through Friday. Do not take on Saturdays and Sundays. 04/25/21   Bo Merino, MD  meloxicam (MOBIC) 7.5 MG tablet Take 7.5 mg by mouth 2 (two) times daily as needed. 02/06/21   [provider]  Multiple Vitamin (MULTIVITAMIN) tablet Take 1 tablet by mouth daily.    [provider]  mycophenolate (CELLCEPT) 500 MG tablet Take 500 mg by mouth 2 (two) times daily.    [provider]  Zinc 100 MG TABS     [provider]    Family History Family History  Problem Relation Age of Onset   Anxiety disorder Mother    Cancer Father    Alcohol abuse Father    Kidney disease Father    Heart disease Father    Mental illness Sister  Heart disease Maternal Grandmother    Alcohol abuse Maternal Aunt    Mental illness Paternal Aunt    Breast cancer Neg Hx     Social History Social History   Tobacco Use   Smoking status: Never   Smokeless tobacco: Never  Vaping Use   Vaping Use: Never used  Substance Use Topics   Alcohol use: No   Drug use: No     Allergies   Sulfa antibiotics, Gluten meal, and Shellfish allergy   Review of Systems Review of Systems  Constitutional:  Positive for fever.  Respiratory:  Positive for cough.      Physical Exam Triage Vital Signs ED Triage Vitals [03/08/22 1931]  Enc Vitals Group     BP 121/84     Pulse Rate 88     Resp 16     Temp 98.1 F (36.7 C)     Temp src      SpO2 99 %     Weight      Height      Head Circumference      Peak Flow      Pain Score 0     Pain Loc      Pain Edu?      Excl. in [place]?    No data found.  Updated Vital Signs BP 121/84   Pulse 88   Temp 98.1 F (36.7 C)   Resp 16    LMP 12/10/2016   SpO2 99%   Visual Acuity Right Eye Distance:   Left Eye Distance:   Bilateral Distance:    Right Eye Near:   Left Eye Near:    Bilateral Near:     Physical Exam Vitals reviewed.  Constitutional:      Appearance: She is well-developed.  Cardiovascular:     Rate and Rhythm: Normal rate and regular rhythm.  Skin:    General: Skin is warm and dry.  Neurological:     General: No focal deficit present.     Mental Status: She is alert and oriented to person, place, and time.  Psychiatric:        Mood and Affect: Mood normal.        Behavior: Behavior normal.      UC Treatments / Results  Labs (all labs ordered are listed, but only abnormal results are displayed) Labs Reviewed - No data to display  EKG   Radiology No results found.  Procedures Procedures (including critical care time)  Medications Ordered in UC Medications - No data to display  Initial Impression / Assessment and Plan / UC Course  I have reviewed the triage vital signs and the nursing notes.  Pertinent labs & imaging results that were available during my care of the patient were reviewed by me and considered in my medical decision making (see chart for details).   Patient is afebrile here without recent antipyretics. Satting well on room air. Overall is well appearing, well hydrated, without respiratory distress. Pulmonary exam is unremarkable.  Lungs CTAB without wheezing, rhonchi, rales.  Suspect bronchitis for her cough.  However some concern for her reported elevated temperature.  Will treat for possible secondary bacterial infection, possibly sinusitis with azithromycin while giving her treatment for cough with benzonatate and Tussionex.  Final Clinical Impressions(s) / UC Diagnoses   Final diagnoses:  None   Discharge Instructions   None    ED Prescriptions   None    PDMP not reviewed this encounter.   Dalayza Zambrana, Annie Main,  FNP 03/08/22 1953

## 2022-03-08 NOTE — ED Triage Notes (Addendum)
Pt. Presents to UC w/ c/o a fever, sore throat and cough that started last night. Pt. States she was diagnosed and treated for the FLU last week and symptoms resolved until similar symptoms started again last night. Pt. Has a hx of lupus and is currently on Cellcept for tx.

## 2022-03-08 NOTE — Discharge Instructions (Signed)
Follow up here or with your primary care provider if your symptoms are worsening or not improving with treatment.     

## 2022-03-28 DIAGNOSIS — E782 Mixed hyperlipidemia: Secondary | ICD-10-CM | POA: Diagnosis not present

## 2022-03-28 DIAGNOSIS — R5383 Other fatigue: Secondary | ICD-10-CM | POA: Diagnosis not present

## 2022-05-06 DIAGNOSIS — Z79899 Other long term (current) drug therapy: Secondary | ICD-10-CM | POA: Diagnosis not present

## 2022-05-28 DIAGNOSIS — D696 Thrombocytopenia, unspecified: Secondary | ICD-10-CM | POA: Diagnosis not present

## 2022-05-28 DIAGNOSIS — Z79899 Other long term (current) drug therapy: Secondary | ICD-10-CM | POA: Diagnosis not present

## 2022-05-28 DIAGNOSIS — M329 Systemic lupus erythematosus, unspecified: Secondary | ICD-10-CM | POA: Diagnosis not present

## 2022-06-04 DIAGNOSIS — Z79899 Other long term (current) drug therapy: Secondary | ICD-10-CM | POA: Diagnosis not present

## 2022-06-04 DIAGNOSIS — M329 Systemic lupus erythematosus, unspecified: Secondary | ICD-10-CM | POA: Diagnosis not present

## 2022-06-25 ENCOUNTER — Other Ambulatory Visit: Payer: Self-pay | Admitting: Nurse Practitioner

## 2022-06-25 ENCOUNTER — Telehealth: Payer: Self-pay

## 2022-06-25 MED ORDER — AMOXICILLIN-POT CLAVULANATE 875-125 MG PO TABS: 1.0000 | ORAL_TABLET | Freq: Two times a day (BID) | ORAL | 0 refills | Status: DC

## 2022-06-25 NOTE — Telephone Encounter (Signed)
Patient called stating that she's having some sinus pressure behind her eyes, her teeth are hurting can we send her something for this

## 2022-06-27 ENCOUNTER — Ambulatory Visit: Payer: Self-pay

## 2022-07-02 ENCOUNTER — Other Ambulatory Visit: Payer: Self-pay | Admitting: Nurse Practitioner

## 2022-07-02 DIAGNOSIS — Z1231 Encounter for screening mammogram for malignant neoplasm of breast: Secondary | ICD-10-CM

## 2022-07-11 ENCOUNTER — Other Ambulatory Visit: Payer: BC Managed Care – PPO

## 2022-07-18 ENCOUNTER — Ambulatory Visit (INDEPENDENT_AMBULATORY_CARE_PROVIDER_SITE_OTHER): Payer: BC Managed Care – PPO | Admitting: Nurse Practitioner

## 2022-07-18 ENCOUNTER — Encounter: Payer: Self-pay | Admitting: Nurse Practitioner

## 2022-07-18 VITALS — BP 110/80 | HR 99 | Ht 63.0 in | Wt 130.4 lb

## 2022-07-18 DIAGNOSIS — R5383 Other fatigue: Secondary | ICD-10-CM | POA: Diagnosis not present

## 2022-07-18 DIAGNOSIS — M329 Systemic lupus erythematosus, unspecified: Secondary | ICD-10-CM

## 2022-07-18 DIAGNOSIS — E782 Mixed hyperlipidemia: Secondary | ICD-10-CM | POA: Diagnosis not present

## 2022-07-18 NOTE — Progress Notes (Signed)
Established Patient Office Visit  Subjective:  Patient ID: Stephanie [name], female    DOB: 10-18-1980  Age: 42 y.o. MRN: [id number]  Chief Complaint  Patient presents with   Follow-up    3 month lab results    3 month follow up and review of recent labs, pt would like CBC today.  Pt inquired of her rheumatologist if cellcept can increase sugar and or cholesterol.  Recommend red yeast rice OTC daily.  Mammo scheduled in 2 weeks.    No other concerns at this time.   Past Medical History:  Diagnosis Date   Anxiety    history of   Anxiety disorder 04/12/2015   Headache(784.0)    SEVERE MIGRANES   PONV (postoperative nausea and vomiting)     Past Surgical History:  Procedure Laterality Date   KNEE ARTHROSCOPY      X 4 ON LEFT KNEE   KNEE ARTHROSCOPY W/ OATS PROCEDURE      X1 ON LEFT KNEE   MANDIBLE SURGERY     VAGINAL HYSTERECTOMY Bilateral 12/17/2016   Procedure: HYSTERECTOMY VAGINAL WITH BILATERAL SALPINGECTOMY;  Surgeon: Tereso Newcomer, MD;  Location: [facility];  Service: Gynecology;  Laterality: Bilateral;   WISDOM TOOTH EXTRACTION      ALL 4 REMOVE    Social History   Socioeconomic History   Marital status: Married    Spouse name: Not on file   Number of children: Not on file   Years of education: Not on file   Highest education level: Not on file  Occupational History   Not on file  Tobacco Use   Smoking status: Never   Smokeless tobacco: Never  Vaping Use   Vaping Use: Never used  Substance and Sexual Activity   Alcohol use: No   Drug use: No   Sexual activity: Yes    Partners: Male    Birth control/protection: Surgical  Other Topics Concern   Not on file  Social History Narrative   Not on file   Social Determinants of Health   Financial Resource Strain: Not on file  Food Insecurity: Not on file  Transportation Needs: Not on file  Physical Activity: Not on file  Stress: Not on file  Social Connections: Not on file  Intimate  Partner Violence: Not on file    Family History  Problem Relation Age of Onset   Anxiety disorder Mother    Cancer Father    Alcohol abuse Father    Kidney disease Father    Heart disease Father    Mental illness Sister    Heart disease Maternal Grandmother    Alcohol abuse Maternal Aunt    Mental illness Paternal Aunt    Breast cancer Neg Hx     Allergies  Allergen Reactions   Sulfa Antibiotics Anaphylaxis and Hives   Gluten Meal    Shellfish Allergy Rash and Hives    Review of Systems  Constitutional: Negative.   HENT: Negative.    Eyes: Negative.   Respiratory: Negative.    Cardiovascular: Negative.   Gastrointestinal: Negative.   Genitourinary: Negative.   Musculoskeletal:  Positive for joint pain.  Skin: Negative.   Neurological: Negative.   Endo/Heme/Allergies: Negative.   Psychiatric/Behavioral: Negative.         Objective:   BP 110/80   Pulse 99   Ht 5\' 3"  (1.6 m)   Wt 130 lb 6.4 oz (59.1 kg)   LMP 12/10/2016   SpO2 99%   BMI 23.10  kg/m   Vitals:   07/18/22 0859  BP: 110/80  Pulse: 99  Height: 5\' 3"  (1.6 m)  Weight: 130 lb 6.4 oz (59.1 kg)  SpO2: 99%  BMI (Calculated): 23.11    Physical Exam Vitals reviewed.  Constitutional:      Appearance: Normal appearance.  HENT:     Head: Normocephalic.     Nose: Nose normal.     Mouth/Throat:     Mouth: Mucous membranes are moist.  Eyes:     Pupils: Pupils are equal, round, and reactive to light.  Cardiovascular:     Rate and Rhythm: Normal rate and regular rhythm.  Pulmonary:     Effort: Pulmonary effort is normal.     Breath sounds: Normal breath sounds.  Abdominal:     General: Bowel sounds are normal.     Palpations: Abdomen is soft.  Musculoskeletal:        General: Normal range of motion.     Cervical back: Normal range of motion and neck supple.  Skin:    General: Skin is warm and dry.  Neurological:     Mental Status: She is alert and oriented to person, place, and time.   Psychiatric:        Mood and Affect: Mood normal.        Behavior: Behavior normal.      No results found for any visits on 07/18/22.  No results found for this or any previous visit (from the past 2160 hour(s)).    Assessment & Plan:   Problem List Items Addressed This Visit   None Visit Diagnoses     Other fatigue    -  Primary   Relevant Orders   CBC with Diff       No follow-ups on file.   Total time spent: 35 minutes  Orson Eva, NP  07/18/2022

## 2022-07-18 NOTE — Patient Instructions (Signed)
1) Follow up appt in 4 months, fasting labs prior 2) CBC

## 2022-07-19 LAB — CBC WITH DIFFERENTIAL/PLATELET
Basophils Absolute: 0.1 10*3/uL (ref 0.0–0.2)
Basos: 1 %
EOS (ABSOLUTE): 0.1 10*3/uL (ref 0.0–0.4)
Eos: 2 %
Hematocrit: 37.9 % (ref 34.0–46.6)
Hemoglobin: 13.1 g/dL (ref 11.1–15.9)
Immature Grans (Abs): 0 10*3/uL (ref 0.0–0.1)
Immature Granulocytes: 0 %
Lymphocytes Absolute: 1.4 10*3/uL (ref 0.7–3.1)
Lymphs: 37 %
MCH: 29.1 pg (ref 26.6–33.0)
MCHC: 34.6 g/dL (ref 31.5–35.7)
MCV: 84 fL (ref 79–97)
Monocytes Absolute: 0.3 10*3/uL (ref 0.1–0.9)
Monocytes: 7 %
Neutrophils Absolute: 2 10*3/uL (ref 1.4–7.0)
Neutrophils: 53 %
Platelets: 184 10*3/uL (ref 150–450)
RBC: 4.5 x10E6/uL (ref 3.77–5.28)
RDW: 12.2 % (ref 11.7–15.4)
WBC: 3.8 10*3/uL (ref 3.4–10.8)

## 2022-07-19 NOTE — Progress Notes (Signed)
Please let pt know her CBC is normal

## 2022-07-19 NOTE — Progress Notes (Signed)
Patient notified

## 2022-07-30 ENCOUNTER — Ambulatory Visit
Admission: RE | Admit: 2022-07-30 | Discharge: 2022-07-30 | Disposition: A | Discharge status: Home / Self Care | Payer: BC Managed Care – PPO | Source: Ambulatory Visit | Attending: Nurse Practitioner | Admitting: Nurse Practitioner

## 2022-07-30 ENCOUNTER — Other Ambulatory Visit: Payer: Self-pay | Admitting: Nurse Practitioner

## 2022-07-30 DIAGNOSIS — Z1231 Encounter for screening mammogram for malignant neoplasm of breast: Secondary | ICD-10-CM

## 2022-07-30 DIAGNOSIS — N644 Mastodynia: Secondary | ICD-10-CM

## 2022-07-31 ENCOUNTER — Ambulatory Visit
Admission: RE | Admit: 2022-07-31 | Discharge: 2022-07-31 | Disposition: A | Discharge status: Home / Self Care | Payer: BC Managed Care – PPO | Source: Ambulatory Visit | Attending: Nurse Practitioner | Admitting: Nurse Practitioner

## 2022-07-31 DIAGNOSIS — N644 Mastodynia: Secondary | ICD-10-CM | POA: Diagnosis not present

## 2022-07-31 DIAGNOSIS — N6489 Other specified disorders of breast: Secondary | ICD-10-CM | POA: Diagnosis not present

## 2022-09-18 DIAGNOSIS — Z79899 Other long term (current) drug therapy: Secondary | ICD-10-CM | POA: Diagnosis not present

## 2022-09-18 DIAGNOSIS — M329 Systemic lupus erythematosus, unspecified: Secondary | ICD-10-CM | POA: Diagnosis not present

## 2022-10-01 DIAGNOSIS — Z79899 Other long term (current) drug therapy: Secondary | ICD-10-CM | POA: Diagnosis not present

## 2022-10-01 DIAGNOSIS — M329 Systemic lupus erythematosus, unspecified: Secondary | ICD-10-CM | POA: Diagnosis not present

## 2022-10-30 DIAGNOSIS — Z79899 Other long term (current) drug therapy: Secondary | ICD-10-CM | POA: Diagnosis not present

## 2022-10-30 DIAGNOSIS — D696 Thrombocytopenia, unspecified: Secondary | ICD-10-CM | POA: Diagnosis not present

## 2022-10-30 DIAGNOSIS — M329 Systemic lupus erythematosus, unspecified: Secondary | ICD-10-CM | POA: Diagnosis not present

## 2022-11-14 ENCOUNTER — Other Ambulatory Visit: Payer: BC Managed Care – PPO

## 2022-11-14 DIAGNOSIS — R7301 Impaired fasting glucose: Secondary | ICD-10-CM | POA: Diagnosis not present

## 2022-11-14 DIAGNOSIS — I10 Essential (primary) hypertension: Secondary | ICD-10-CM | POA: Diagnosis not present

## 2022-11-14 DIAGNOSIS — R5383 Other fatigue: Secondary | ICD-10-CM | POA: Diagnosis not present

## 2022-11-14 DIAGNOSIS — E782 Mixed hyperlipidemia: Secondary | ICD-10-CM

## 2022-11-15 LAB — HEMOGLOBIN A1C
Est. average glucose Bld gHb Est-mCnc: 100 mg/dL
Hgb A1c MFr Bld: 5.1 % (ref 4.8–5.6)

## 2022-11-15 LAB — LIPID PANEL
Chol/HDL Ratio: 3.1 ratio (ref 0.0–4.4)
Cholesterol, Total: 182 mg/dL (ref 100–199)
HDL: 58 mg/dL (ref >39)
LDL Chol Calc (NIH): 109 mg/dL — ABNORMAL HIGH (ref 0–99)
Triglycerides: 79 mg/dL (ref 0–149)
VLDL Cholesterol Cal: 15 mg/dL (ref 5–40)

## 2022-11-15 LAB — CMP14+EGFR
ALT: 14 IU/L (ref 0–32)
AST: 18 IU/L (ref 0–40)
Albumin: 4.4 g/dL (ref 3.9–4.9)
Alkaline Phosphatase: 42 IU/L — ABNORMAL LOW (ref 44–121)
BUN/Creatinine Ratio: 13 (ref 9–23)
BUN: 10 mg/dL (ref 6–24)
Bilirubin Total: 0.4 mg/dL (ref 0.0–1.2)
CO2: 27 mmol/L (ref 20–29)
Calcium: 9.3 mg/dL (ref 8.7–10.2)
Chloride: 104 mmol/L (ref 96–106)
Creatinine, Ser: 0.75 mg/dL (ref 0.57–1.00)
Globulin, Total: 2.3 g/dL (ref 1.5–4.5)
Glucose: 81 mg/dL (ref 70–99)
Potassium: 4.2 mmol/L (ref 3.5–5.2)
Sodium: 142 mmol/L (ref 134–144)
Total Protein: 6.7 g/dL (ref 6.0–8.5)
eGFR: 103 mL/min/{1.73_m2} (ref >59)

## 2022-11-15 LAB — CBC WITH DIFFERENTIAL/PLATELET
Basophils Absolute: 0 10*3/uL (ref 0.0–0.2)
Basos: 1 %
EOS (ABSOLUTE): 0 10*3/uL (ref 0.0–0.4)
Eos: 1 %
Hematocrit: 39.7 % (ref 34.0–46.6)
Hemoglobin: 13.5 g/dL (ref 11.1–15.9)
Immature Grans (Abs): 0 10*3/uL (ref 0.0–0.1)
Immature Granulocytes: 0 %
Lymphocytes Absolute: 1.1 10*3/uL (ref 0.7–3.1)
Lymphs: 27 %
MCH: 28.8 pg (ref 26.6–33.0)
MCHC: 34 g/dL (ref 31.5–35.7)
MCV: 85 fL (ref 79–97)
Monocytes Absolute: 0.3 10*3/uL (ref 0.1–0.9)
Monocytes: 8 %
Neutrophils Absolute: 2.8 10*3/uL (ref 1.4–7.0)
Neutrophils: 63 %
Platelets: 187 10*3/uL (ref 150–450)
RBC: 4.68 x10E6/uL (ref 3.77–5.28)
RDW: 12.7 % (ref 11.7–15.4)
WBC: 4.3 10*3/uL (ref 3.4–10.8)

## 2022-11-15 LAB — TSH: TSH: 3.28 u[IU]/mL (ref 0.450–4.500)

## 2022-11-20 ENCOUNTER — Ambulatory Visit: Payer: BC Managed Care – PPO | Admitting: Cardiology

## 2022-11-20 ENCOUNTER — Encounter: Payer: Self-pay | Admitting: Cardiology

## 2022-11-20 VITALS — BP 122/69 | HR 85 | Ht 63.0 in | Wt 135.4 lb

## 2022-11-20 DIAGNOSIS — E782 Mixed hyperlipidemia: Secondary | ICD-10-CM

## 2022-11-20 NOTE — Progress Notes (Signed)
Established Patient Office Visit  Subjective:  Patient ID: Stephanie [name], female    DOB: 1980-09-17  Age: 42 y.o. MRN: [id number]  Chief Complaint  Patient presents with   Follow-up    Patient in office for 4 month follow up, discuss recent lab results. Patient doing well. No acute complaints. LDL elevated. Continue to work on diet and exercise. Recent mammogram, normal.     No other concerns at this time.   Past Medical History:  Diagnosis Date   Anxiety    history of   Anxiety disorder 04/12/2015   Headache(784.0)    SEVERE MIGRANES   PONV (postoperative nausea and vomiting)     Past Surgical History:  Procedure Laterality Date   KNEE ARTHROSCOPY      X 4 ON LEFT KNEE   KNEE ARTHROSCOPY W/ OATS PROCEDURE      X1 ON LEFT KNEE   MANDIBLE SURGERY     VAGINAL HYSTERECTOMY Bilateral 12/17/2016   Procedure: HYSTERECTOMY VAGINAL WITH BILATERAL SALPINGECTOMY;  Surgeon: Tereso Newcomer, MD;  Location: [facility];  Service: Gynecology;  Laterality: Bilateral;   WISDOM TOOTH EXTRACTION      ALL 4 REMOVE    Social History   Socioeconomic History   Marital status: Married    Spouse name: Not on file   Number of children: Not on file   Years of education: Not on file   Highest education level: Not on file  Occupational History   Not on file  Tobacco Use   Smoking status: Never   Smokeless tobacco: Never  Vaping Use   Vaping status: Never Used  Substance and Sexual Activity   Alcohol use: No   Drug use: No   Sexual activity: Yes    Partners: Male    Birth control/protection: Surgical  Other Topics Concern   Not on file  Social History Narrative   Not on file   Social Determinants of Health   Financial Resource Strain: Not on file  Food Insecurity: Not on file  Transportation Needs: Not on file  Physical Activity: Not on file  Stress: Not on file  Social Connections: Not on file  Intimate Partner Violence: Not on file    Family History   Problem Relation Age of Onset   Anxiety disorder Mother    Cancer Father    Alcohol abuse Father    Kidney disease Father    Heart disease Father    Mental illness Sister    Heart disease Maternal Grandmother    Alcohol abuse Maternal Aunt    Mental illness Paternal Aunt    Breast cancer Neg Hx     Allergies  Allergen Reactions   Sulfa Antibiotics Anaphylaxis and Hives   Gluten Meal    Shellfish Allergy Rash and Hives    Review of Systems  Constitutional: Negative.   HENT: Negative.    Eyes: Negative.   Respiratory: Negative.  Negative for shortness of breath.   Cardiovascular: Negative.  Negative for chest pain.  Gastrointestinal: Negative.  Negative for abdominal pain, constipation and diarrhea.  Genitourinary: Negative.   Musculoskeletal:  Negative for joint pain and myalgias.  Skin: Negative.   Neurological: Negative.  Negative for dizziness and headaches.  Endo/Heme/Allergies: Negative.   All other systems reviewed and are negative.      Objective:   BP 122/69   Pulse 85   Ht 5\' 3"  (1.6 m)   Wt 135 lb 6.4 oz (61.4 kg)   LMP 12/10/2016  SpO2 99%   BMI 23.99 kg/m   Vitals:   11/20/22 0923  BP: 122/69  Pulse: 85  Height: 5\' 3"  (1.6 m)  Weight: 135 lb 6.4 oz (61.4 kg)  SpO2: 99%  BMI (Calculated): 23.99    Physical Exam Vitals and nursing note reviewed.  Constitutional:      Appearance: Normal appearance. She is normal weight.  HENT:     Head: Normocephalic and atraumatic.     Nose: Nose normal.     Mouth/Throat:     Mouth: Mucous membranes are moist.  Eyes:     Extraocular Movements: Extraocular movements intact.     Conjunctiva/sclera: Conjunctivae normal.     Pupils: Pupils are equal, round, and reactive to light.  Cardiovascular:     Rate and Rhythm: Normal rate and regular rhythm.     Pulses: Normal pulses.     Heart sounds: Normal heart sounds.  Pulmonary:     Effort: Pulmonary effort is normal.     Breath sounds: Normal breath  sounds.  Abdominal:     General: Abdomen is flat. Bowel sounds are normal.     Palpations: Abdomen is soft.  Musculoskeletal:        General: Normal range of motion.     Cervical back: Normal range of motion.  Skin:    General: Skin is warm and dry.  Neurological:     General: No focal deficit present.     Mental Status: She is alert and oriented to person, place, and time.  Psychiatric:        Mood and Affect: Mood normal.        Behavior: Behavior normal.        Thought Content: Thought content normal.        Judgment: Judgment normal.      No results found for any visits on 11/20/22.  Recent Results (from the past 2160 hour(s))  Hemoglobin A1c     Status: None   Collection Time: 11/14/22  8:44 AM  Result Value Ref Range   Hgb A1c MFr Bld 5.1 4.8 - 5.6 %    Comment:          Prediabetes: 5.7 - 6.4          Diabetes: >6.4          Glycemic control for adults with diabetes: <7.0    Est. average glucose Bld gHb Est-mCnc 100 mg/dL  TSH     Status: None   Collection Time: 11/14/22  8:44 AM  Result Value Ref Range   TSH 3.280 0.450 - 4.500 uIU/mL  CMP14+EGFR     Status: Abnormal   Collection Time: 11/14/22  8:44 AM  Result Value Ref Range   Glucose 81 70 - 99 mg/dL   BUN 10 6 - 24 mg/dL   Creatinine, Ser 1.30 0.57 - 1.00 mg/dL   eGFR 865 >78 IO/NGE/9.52   BUN/Creatinine Ratio 13 9 - 23   Sodium 142 134 - 144 mmol/L   Potassium 4.2 3.5 - 5.2 mmol/L   Chloride 104 96 - 106 mmol/L   CO2 27 20 - 29 mmol/L   Calcium 9.3 8.7 - 10.2 mg/dL   Total Protein 6.7 6.0 - 8.5 g/dL   Albumin 4.4 3.9 - 4.9 g/dL   Globulin, Total 2.3 1.5 - 4.5 g/dL   Bilirubin Total 0.4 0.0 - 1.2 mg/dL   Alkaline Phosphatase 42 (L) 44 - 121 IU/L   AST 18 0 - 40 IU/L  ALT 14 0 - 32 IU/L  Lipid panel     Status: Abnormal   Collection Time: 11/14/22  8:44 AM  Result Value Ref Range   Cholesterol, Total 182 100 - 199 mg/dL   Triglycerides 79 0 - 149 mg/dL   HDL 58 >54 mg/dL   VLDL Cholesterol  Cal 15 5 - 40 mg/dL   LDL Chol Calc (NIH) 098 (H) 0 - 99 mg/dL   Chol/HDL Ratio 3.1 0.0 - 4.4 ratio    Comment:                                   T. Chol/HDL Ratio                                             Men  Women                               1/2 Avg.Risk  3.4    3.3                                   Avg.Risk  5.0    4.4                                2X Avg.Risk  9.6    7.1                                3X Avg.Risk 23.4   11.0   CBC with Differential/Platelet     Status: None   Collection Time: 11/14/22  8:44 AM  Result Value Ref Range   WBC 4.3 3.4 - 10.8 x10E3/uL   RBC 4.68 3.77 - 5.28 x10E6/uL   Hemoglobin 13.5 11.1 - 15.9 g/dL   Hematocrit 11.9 14.7 - 46.6 %   MCV 85 79 - 97 fL   MCH 28.8 26.6 - 33.0 pg   MCHC 34.0 31.5 - 35.7 g/dL   RDW 82.9 56.2 - 13.0 %   Platelets 187 150 - 450 x10E3/uL   Neutrophils 63 Not Estab. %   Lymphs 27 Not Estab. %   Monocytes 8 Not Estab. %   Eos 1 Not Estab. %   Basos 1 Not Estab. %   Neutrophils Absolute 2.8 1.4 - 7.0 x10E3/uL   Lymphocytes Absolute 1.1 0.7 - 3.1 x10E3/uL   Monocytes Absolute 0.3 0.1 - 0.9 x10E3/uL   EOS (ABSOLUTE) 0.0 0.0 - 0.4 x10E3/uL   Basophils Absolute 0.0 0.0 - 0.2 x10E3/uL   Immature Granulocytes 0 Not Estab. %   Immature Grans (Abs) 0.0 0.0 - 0.1 x10E3/uL      Assessment & Plan:  Work on diet and exercise to lower LDL.  Continue all medications.   Problem List Items Addressed This Visit       Other   Mixed hyperlipidemia - Primary    Return in about 6 months (around 05/20/2023) for with fasting labs prior.   Total time spent: 25 minutes  [employer], NP  11/20/2022   This document may have been prepared by [employer]  Voice Recognition software and as such may include unintentional dictation errors.

## 2022-11-22 ENCOUNTER — Ambulatory Visit: Payer: BC Managed Care – PPO | Admitting: Cardiology

## 2022-11-26 ENCOUNTER — Other Ambulatory Visit: Payer: Self-pay | Admitting: Cardiology

## 2022-11-26 ENCOUNTER — Telehealth: Payer: Self-pay

## 2022-11-26 MED ORDER — AMOXICILLIN-POT CLAVULANATE 875-125 MG PO TABS: 1.0000 | ORAL_TABLET | Freq: Two times a day (BID) | ORAL | 0 refills | Status: AC

## 2022-11-26 NOTE — Telephone Encounter (Signed)
Patient called stating last week when her husband was in here he was sick and now she has got the same thing that he's got. She is wondering if youd send her In an abx or did you want her to come in to see you ? She's having some cold like sx but mostly congestion, she's taking mucinex at the moment but with her having lupus its takes her a little more to fight off the virus

## 2022-11-27 ENCOUNTER — Ambulatory Visit (INDEPENDENT_AMBULATORY_CARE_PROVIDER_SITE_OTHER): Payer: BC Managed Care – PPO

## 2022-11-27 ENCOUNTER — Ambulatory Visit
Admission: RE | Admit: 2022-11-27 | Discharge: 2022-11-27 | Disposition: A | Discharge status: Home / Self Care | Payer: BC Managed Care – PPO | Source: Ambulatory Visit | Attending: Emergency Medicine | Admitting: Emergency Medicine

## 2022-11-27 VITALS — BP 128/84 | HR 107 | Temp 99.0°F | Resp 18

## 2022-11-27 DIAGNOSIS — R079 Chest pain, unspecified: Secondary | ICD-10-CM

## 2022-11-27 DIAGNOSIS — J209 Acute bronchitis, unspecified: Secondary | ICD-10-CM | POA: Diagnosis not present

## 2022-11-27 DIAGNOSIS — R509 Fever, unspecified: Secondary | ICD-10-CM | POA: Diagnosis not present

## 2022-11-27 DIAGNOSIS — R059 Cough, unspecified: Secondary | ICD-10-CM | POA: Diagnosis not present

## 2022-11-27 DIAGNOSIS — R0602 Shortness of breath: Secondary | ICD-10-CM

## 2022-11-27 HISTORY — DX: Reserved for concepts with insufficient information to code with codable children: IMO0002

## 2022-11-27 MED ORDER — BENZONATATE 100 MG PO CAPS: 100.0000 mg | ORAL_CAPSULE | Freq: Three times a day (TID) | ORAL | 0 refills | Status: DC | PRN

## 2022-11-27 NOTE — ED Provider Notes (Signed)
Renaldo Fiddler    CSN: [id number] Arrival date & time: 11/27/22  1335      History   Chief Complaint Chief Complaint  Patient presents with   Cough    Congestion,  fever, bad cough, - Entered by patient    HPI Stephanie [name] is a 42 y.o. female.  Patient presents with 3-week history of low-grade fever, congestion, cough.  Patient reports right side sharp chest pain when coughing.  She also reports shortness of breath when coughing.  Tmax 100.7.  She denies rash, vomiting, diarrhea, or other symptoms.  Her PCP called in a prescription for Augmentin yesterday; patient has taken 2 doses.  Her medical history includes lupus.  The history is provided by the patient and medical records.    Past Medical History:  Diagnosis Date   Anxiety    history of   Anxiety disorder 04/12/2015   Headache(784.0)    SEVERE MIGRANES   Lupus (HCC)    PONV (postoperative nausea and vomiting)     Patient Active Problem List   Diagnosis Date Noted   Other fatigue 07/18/2022   Lupus (HCC) 07/18/2022   Mixed hyperlipidemia 07/18/2022   Apertognathia 10/22/2018   Breast lump 09/15/2018   Chronic venous insufficiency 04/27/2017   Swelling of limb 04/27/2017    Past Surgical History:  Procedure Laterality Date   KNEE ARTHROSCOPY      X 4 ON LEFT KNEE   KNEE ARTHROSCOPY W/ OATS PROCEDURE      X1 ON LEFT KNEE   MANDIBLE SURGERY     VAGINAL HYSTERECTOMY Bilateral 12/17/2016   Procedure: HYSTERECTOMY VAGINAL WITH BILATERAL SALPINGECTOMY;  Surgeon: Tereso Newcomer, MD;  Location: [facility];  Service: Gynecology;  Laterality: Bilateral;   WISDOM TOOTH EXTRACTION      ALL 4 REMOVE    OB History     Gravida  1   Para  1   Term  1   Preterm  0   AB  0   Living  1      SAB  0   IAB  0   Ectopic  0   Multiple  0   Live Births  1            Home Medications    Prior to Admission medications   Medication Sig Start Date End Date Taking? Authorizing  Provider  amoxicillin-clavulanate (AUGMENTIN) 875-125 MG tablet Take 1 tablet by mouth 2 (two) times daily for 10 days. 11/26/22 12/06/22  Scoggins, Amber, NP  benzonatate (TESSALON) 100 MG capsule Take 1 capsule (100 mg total) by mouth 3 (three) times daily as needed for cough. 11/27/22  Yes Mickie Bail, NP  Ascorbic Acid (VITAMIN C) 500 MG CAPS     [provider]  hydroxychloroquine (PLAQUENIL) 200 MG tablet Take 1 tablet twice daily Monday through Friday. Do not take on Saturdays and Sundays. 04/25/21   Pollyann Savoy, MD  Multiple Vitamin (MULTIVITAMIN) tablet Take 1 tablet by mouth daily.    [provider]  mycophenolate (CELLCEPT) 500 MG tablet Take 500 mg by mouth in the morning, at noon, and at bedtime.    [provider]    Family History Family History  Problem Relation Age of Onset   Anxiety disorder Mother    Cancer Father    Alcohol abuse Father    Kidney disease Father    Heart disease Father    Mental illness Sister    Heart disease Maternal  Grandmother    Alcohol abuse Maternal Aunt    Mental illness Paternal Aunt    Breast cancer Neg Hx     Social History Social History   Tobacco Use   Smoking status: Never   Smokeless tobacco: Never  Vaping Use   Vaping status: Never Used  Substance Use Topics   Alcohol use: No   Drug use: No     Allergies   Sulfa antibiotics, Gluten meal, and Shellfish allergy   Review of Systems Review of Systems  Constitutional:  Negative for chills and fever.  HENT:  Positive for congestion, postnasal drip, rhinorrhea and sore throat. Negative for ear pain.   Respiratory:  Positive for cough and shortness of breath.   Cardiovascular:  Positive for chest pain. Negative for palpitations.  Gastrointestinal:  Negative for diarrhea and vomiting.     Physical Exam Triage Vital Signs ED Triage Vitals  Encounter Vitals Group     BP      Systolic BP Percentile      Diastolic BP Percentile      Pulse       Resp      Temp      Temp src      SpO2      Weight      Height      Head Circumference      Peak Flow      Pain Score      Pain Loc      Pain Education      Exclude from Growth Chart    No data found.  Updated Vital Signs BP 128/84   Pulse (!) 107   Temp 99 F (37.2 C)   Resp 18   LMP 12/10/2016   SpO2 97%   Visual Acuity Right Eye Distance:   Left Eye Distance:   Bilateral Distance:    Right Eye Near:   Left Eye Near:    Bilateral Near:     Physical Exam Vitals and nursing note reviewed.  Constitutional:      General: She is not in acute distress.    Appearance: She is well-developed.  HENT:     Right Ear: Tympanic membrane normal.     Left Ear: Tympanic membrane normal.     Nose: Congestion and rhinorrhea present.     Mouth/Throat:     Mouth: Mucous membranes are moist.     Pharynx: Oropharynx is clear.  Cardiovascular:     Rate and Rhythm: Normal rate and regular rhythm.     Heart sounds: Normal heart sounds.  Pulmonary:     Effort: Pulmonary effort is normal. No respiratory distress.     Breath sounds: Normal breath sounds.  Musculoskeletal:     Cervical back: Neck supple.  Skin:    General: Skin is warm and dry.  Neurological:     Mental Status: She is alert.      UC Treatments / Results  Labs (all labs ordered are listed, but only abnormal results are displayed) Labs Reviewed - No data to display  EKG   Radiology DG Chest 2 View  Result Date: 11/27/2022 CLINICAL DATA:  Cough, fever. EXAM: CHEST - 2 VIEW COMPARISON:  Jul 19, 2009. FINDINGS: The heart size and mediastinal contours are within normal limits. Both lungs are clear. The visualized skeletal structures are unremarkable. IMPRESSION: No active cardiopulmonary disease. Electronically Signed   By: Lupita Raider M.D.   On: 11/27/2022 15:31    Procedures Procedures (  including critical care time)  Medications Ordered in UC Medications - No data to display  Initial Impression  / Assessment and Plan / UC Course  I have reviewed the triage vital signs and the nursing notes.  Pertinent labs & imaging results that were available during my care of the patient were reviewed by me and considered in my medical decision making (see chart for details).   Shortness of breath, chest pain, acute bronchitis.  EKG shows sinus rhythm, rate 96, no ST elevation, compared to previous from 2013.  CXR negative.  Patient was prescribed Augmentin yesterday by her PCP; she has taken 2 doses.  Instructed her to continue this antibiotic as directed.  Treating her cough with Tessalon Perles.  Instructed her to follow-up with her PCP tomorrow.  ED precautions given.  She agrees to plan of care.  Final Clinical Impressions(s) / UC Diagnoses   Final diagnoses:  Shortness of breath  Chest pain, unspecified type  Acute bronchitis, unspecified organism     Discharge Instructions      Take the Tessalon Perles as directed for cough.  Continue taking the Augmentin as prescribed.  Follow-up with your primary care provider tomorrow.  Go to the emergency department if you have worsening symptoms.     ED Prescriptions     Medication Sig Dispense Auth. Provider   benzonatate (TESSALON) 100 MG capsule Take 1 capsule (100 mg total) by mouth 3 (three) times daily as needed for cough. 21 capsule Mickie Bail, NP      PDMP not reviewed this encounter.   Mickie Bail, NP 11/27/22 1536

## 2022-11-27 NOTE — Discharge Instructions (Addendum)
Take the [facility] as directed for cough.  Continue taking the Augmentin as prescribed.  Follow-up with your primary care provider tomorrow.  Go to the emergency department if you have worsening symptoms.

## 2022-11-27 NOTE — ED Triage Notes (Signed)
Patient to Urgent Care with complaints of cough/ nasal congestion/ fevers.  Symptoms started three weeks ago. Symptoms worsened 6-7 days ago. Reports fever x3 days. Max temp 100.7.  Takes immunosuppressant.

## 2022-12-09 DIAGNOSIS — S199XXA Unspecified injury of neck, initial encounter: Secondary | ICD-10-CM | POA: Diagnosis not present

## 2022-12-16 ENCOUNTER — Telehealth: Payer: BC Managed Care – PPO | Admitting: Physician Assistant

## 2022-12-16 DIAGNOSIS — T3695XA Adverse effect of unspecified systemic antibiotic, initial encounter: Secondary | ICD-10-CM | POA: Diagnosis not present

## 2022-12-16 DIAGNOSIS — B379 Candidiasis, unspecified: Secondary | ICD-10-CM

## 2022-12-16 DIAGNOSIS — R3989 Other symptoms and signs involving the genitourinary system: Secondary | ICD-10-CM

## 2022-12-16 MED ORDER — NITROFURANTOIN MONOHYD MACRO 100 MG PO CAPS: 100.0000 mg | ORAL_CAPSULE | Freq: Two times a day (BID) | ORAL | 0 refills | Status: AC

## 2022-12-16 NOTE — Progress Notes (Signed)

## 2022-12-17 MED ORDER — FLUCONAZOLE 150 MG PO TABS: 150.0000 mg | ORAL_TABLET | Freq: Once | ORAL | 0 refills | Status: AC

## 2022-12-17 NOTE — Addendum Note (Signed)
Addended by: Margaretann Loveless on: 12/17/2022 12:35 PM   Modules accepted: Orders

## 2022-12-25 DIAGNOSIS — Z79899 Other long term (current) drug therapy: Secondary | ICD-10-CM | POA: Diagnosis not present

## 2022-12-25 DIAGNOSIS — M329 Systemic lupus erythematosus, unspecified: Secondary | ICD-10-CM | POA: Diagnosis not present

## 2023-01-02 DIAGNOSIS — M329 Systemic lupus erythematosus, unspecified: Secondary | ICD-10-CM | POA: Diagnosis not present

## 2023-01-02 DIAGNOSIS — Z79899 Other long term (current) drug therapy: Secondary | ICD-10-CM | POA: Diagnosis not present

## 2023-01-21 ENCOUNTER — Ambulatory Visit (INDEPENDENT_AMBULATORY_CARE_PROVIDER_SITE_OTHER): Payer: BC Managed Care – PPO | Admitting: Cardiology

## 2023-01-21 ENCOUNTER — Encounter: Payer: Self-pay | Admitting: Cardiology

## 2023-01-21 VITALS — BP 112/72 | HR 85 | Ht 63.0 in | Wt 131.0 lb

## 2023-01-21 DIAGNOSIS — R1012 Left upper quadrant pain: Secondary | ICD-10-CM | POA: Diagnosis not present

## 2023-01-21 NOTE — Progress Notes (Signed)
Established Patient Office Visit  Subjective:  Patient ID: Stephanie [name], female    DOB: 07-28-1980  Age: 42 y.o. MRN: [id number]  Chief Complaint  Patient presents with   Acute Visit    Pain under L Breast Near Rib    Patient in office complaining of pain under left breast, near her ribs. Patient has been experiencing pain intermittently since 07/2022. Normal mammogram and ultrasound 07/2022. Patient reports pain is under her left breast, under her bra, near her ribs. Patient states pain occurs monthly around the time she would normally have a menstrual cycle. Will order a CT scan.     No other concerns at this time.   Past Medical History:  Diagnosis Date   Anxiety    history of   Anxiety disorder 04/12/2015   Headache(784.0)    SEVERE MIGRANES   Lupus    PONV (postoperative nausea and vomiting)     Past Surgical History:  Procedure Laterality Date   KNEE ARTHROSCOPY      X 4 ON LEFT KNEE   KNEE ARTHROSCOPY W/ OATS PROCEDURE      X1 ON LEFT KNEE   MANDIBLE SURGERY     VAGINAL HYSTERECTOMY Bilateral 12/17/2016   Procedure: HYSTERECTOMY VAGINAL WITH BILATERAL SALPINGECTOMY;  Surgeon: Tereso Newcomer, MD;  Location: [facility];  Service: Gynecology;  Laterality: Bilateral;   WISDOM TOOTH EXTRACTION      ALL 4 REMOVE    Social History   Socioeconomic History   Marital status: Married    Spouse name: Not on file   Number of children: Not on file   Years of education: Not on file   Highest education level: Not on file  Occupational History   Not on file  Tobacco Use   Smoking status: Never   Smokeless tobacco: Never  Vaping Use   Vaping status: Never Used  Substance and Sexual Activity   Alcohol use: No   Drug use: No   Sexual activity: Yes    Partners: Male    Birth control/protection: Surgical  Other Topics Concern   Not on file  Social History Narrative   Not on file   Social Determinants of Health   Financial Resource Strain: Not on file   Food Insecurity: Not on file  Transportation Needs: Not on file  Physical Activity: Not on file  Stress: Not on file  Social Connections: Not on file  Intimate Partner Violence: Not on file    Family History  Problem Relation Age of Onset   Anxiety disorder Mother    Cancer Father    Alcohol abuse Father    Kidney disease Father    Heart disease Father    Mental illness Sister    Heart disease Maternal Grandmother    Alcohol abuse Maternal Aunt    Mental illness Paternal Aunt    Breast cancer Neg Hx     Allergies  Allergen Reactions   Sulfa Antibiotics Anaphylaxis and Hives   Gluten Meal    Shellfish Allergy Rash and Hives    Review of Systems  Constitutional: Negative.   HENT: Negative.    Eyes: Negative.   Respiratory: Negative.    Cardiovascular: Negative.   Gastrointestinal: Negative.   Genitourinary: Negative.   Musculoskeletal: Negative.   Skin: Negative.   Neurological: Negative.   Endo/Heme/Allergies: Negative.   Psychiatric/Behavioral: Negative.    All other systems reviewed and are negative.      Objective:   BP 112/72  Pulse 85   Ht 5\' 3"  (1.6 m)   Wt 131 lb (59.4 kg)   LMP 12/10/2016   SpO2 98%   BMI 23.21 kg/m   Vitals:   01/21/23 0904  BP: 112/72  Pulse: 85  Height: 5\' 3"  (1.6 m)  Weight: 131 lb (59.4 kg)  SpO2: 98%  BMI (Calculated): 23.21    Physical Exam Vitals and nursing note reviewed.  Constitutional:      Appearance: Normal appearance. She is normal weight.  HENT:     Head: Normocephalic and atraumatic.     Nose: Nose normal.     Mouth/Throat:     Mouth: Mucous membranes are moist.  Eyes:     Extraocular Movements: Extraocular movements intact.     Conjunctiva/sclera: Conjunctivae normal.     Pupils: Pupils are equal, round, and reactive to light.  Cardiovascular:     Rate and Rhythm: Normal rate and regular rhythm.     Pulses: Normal pulses.     Heart sounds: Normal heart sounds.  Pulmonary:     Effort:  Pulmonary effort is normal.     Breath sounds: Normal breath sounds.  Abdominal:     General: Abdomen is flat. Bowel sounds are normal.     Palpations: Abdomen is soft.  Musculoskeletal:        General: Normal range of motion.     Cervical back: Normal range of motion.  Skin:    General: Skin is warm and dry.  Neurological:     General: No focal deficit present.     Mental Status: She is alert and oriented to person, place, and time.  Psychiatric:        Mood and Affect: Mood normal.        Behavior: Behavior normal.        Thought Content: Thought content normal.        Judgment: Judgment normal.      No results found for any visits on 01/21/23.  Recent Results (from the past 2160 hour(s))  Hemoglobin A1c     Status: None   Collection Time: 11/14/22  8:44 AM  Result Value Ref Range   Hgb A1c MFr Bld 5.1 4.8 - 5.6 %    Comment:          Prediabetes: 5.7 - 6.4          Diabetes: >6.4          Glycemic control for adults with diabetes: <7.0    Est. average glucose Bld gHb Est-mCnc 100 mg/dL  TSH     Status: None   Collection Time: 11/14/22  8:44 AM  Result Value Ref Range   TSH 3.280 0.450 - 4.500 uIU/mL  CMP14+EGFR     Status: Abnormal   Collection Time: 11/14/22  8:44 AM  Result Value Ref Range   Glucose 81 70 - 99 mg/dL   BUN 10 6 - 24 mg/dL   Creatinine, Ser 1.61 0.57 - 1.00 mg/dL   eGFR 096 >04 VW/UJW/1.19   BUN/Creatinine Ratio 13 9 - 23   Sodium 142 134 - 144 mmol/L   Potassium 4.2 3.5 - 5.2 mmol/L   Chloride 104 96 - 106 mmol/L   CO2 27 20 - 29 mmol/L   Calcium 9.3 8.7 - 10.2 mg/dL   Total Protein 6.7 6.0 - 8.5 g/dL   Albumin 4.4 3.9 - 4.9 g/dL   Globulin, Total 2.3 1.5 - 4.5 g/dL   Bilirubin Total 0.4 0.0 - 1.2  mg/dL   Alkaline Phosphatase 42 (L) 44 - 121 IU/L   AST 18 0 - 40 IU/L   ALT 14 0 - 32 IU/L  Lipid panel     Status: Abnormal   Collection Time: 11/14/22  8:44 AM  Result Value Ref Range   Cholesterol, Total 182 100 - 199 mg/dL    Triglycerides 79 0 - 149 mg/dL   HDL 58 >09 mg/dL   VLDL Cholesterol Cal 15 5 - 40 mg/dL   LDL Chol Calc (NIH) 811 (H) 0 - 99 mg/dL   Chol/HDL Ratio 3.1 0.0 - 4.4 ratio    Comment:                                   T. Chol/HDL Ratio                                             Men  Women                               1/2 Avg.Risk  3.4    3.3                                   Avg.Risk  5.0    4.4                                2X Avg.Risk  9.6    7.1                                3X Avg.Risk 23.4   11.0   CBC with Differential/Platelet     Status: None   Collection Time: 11/14/22  8:44 AM  Result Value Ref Range   WBC 4.3 3.4 - 10.8 x10E3/uL   RBC 4.68 3.77 - 5.28 x10E6/uL   Hemoglobin 13.5 11.1 - 15.9 g/dL   Hematocrit 91.4 78.2 - 46.6 %   MCV 85 79 - 97 fL   MCH 28.8 26.6 - 33.0 pg   MCHC 34.0 31.5 - 35.7 g/dL   RDW 95.6 21.3 - 08.6 %   Platelets 187 150 - 450 x10E3/uL   Neutrophils 63 Not Estab. %   Lymphs 27 Not Estab. %   Monocytes 8 Not Estab. %   Eos 1 Not Estab. %   Basos 1 Not Estab. %   Neutrophils Absolute 2.8 1.4 - 7.0 x10E3/uL   Lymphocytes Absolute 1.1 0.7 - 3.1 x10E3/uL   Monocytes Absolute 0.3 0.1 - 0.9 x10E3/uL   EOS (ABSOLUTE) 0.0 0.0 - 0.4 x10E3/uL   Basophils Absolute 0.0 0.0 - 0.2 x10E3/uL   Immature Granulocytes 0 Not Estab. %   Immature Grans (Abs) 0.0 0.0 - 0.1 x10E3/uL      Assessment & Plan:  CT scan for pain below left breast.   Problem List Items Addressed This Visit       Other   LUQ pain - Primary   Relevant Orders   CT ABDOMEN W WO CONTRAST    Return in about 4 weeks (around 02/18/2023).  Total time spent: 25 minutes  [employer], NP  01/21/2023   This document may have been prepared by Dragon Voice Recognition software and as such may include unintentional dictation errors.

## 2023-01-28 ENCOUNTER — Ambulatory Visit
Admission: RE | Admit: 2023-01-28 | Discharge: 2023-01-28 | Disposition: A | Discharge status: Home / Self Care | Payer: BC Managed Care – PPO | Source: Ambulatory Visit | Attending: Cardiology | Admitting: Cardiology

## 2023-01-28 DIAGNOSIS — R1012 Left upper quadrant pain: Secondary | ICD-10-CM | POA: Insufficient documentation

## 2023-01-28 MED ORDER — IOHEXOL 300 MG/ML  SOLN
85.0000 mL | Freq: Once | INTRAMUSCULAR | Status: AC | PRN
  Administered 2023-01-28: 85 mL via INTRAVENOUS

## 2023-02-18 ENCOUNTER — Ambulatory Visit: Payer: BC Managed Care – PPO | Admitting: Cardiology

## 2023-02-25 ENCOUNTER — Encounter: Payer: Self-pay | Admitting: Cardiology

## 2023-02-26 ENCOUNTER — Ambulatory Visit: Payer: BC Managed Care – PPO | Admitting: Cardiology

## 2023-03-06 ENCOUNTER — Ambulatory Visit: Payer: BC Managed Care – PPO | Admitting: Cardiology

## 2023-03-17 DIAGNOSIS — Z79899 Other long term (current) drug therapy: Secondary | ICD-10-CM | POA: Diagnosis not present

## 2023-03-17 DIAGNOSIS — M329 Systemic lupus erythematosus, unspecified: Secondary | ICD-10-CM | POA: Diagnosis not present

## 2023-04-03 DIAGNOSIS — Z79899 Other long term (current) drug therapy: Secondary | ICD-10-CM | POA: Diagnosis not present

## 2023-04-03 DIAGNOSIS — M329 Systemic lupus erythematosus, unspecified: Secondary | ICD-10-CM | POA: Diagnosis not present

## 2023-05-16 ENCOUNTER — Telehealth: Payer: BC Managed Care – PPO | Admitting: Physician Assistant

## 2023-05-16 DIAGNOSIS — B379 Candidiasis, unspecified: Secondary | ICD-10-CM

## 2023-05-16 DIAGNOSIS — J019 Acute sinusitis, unspecified: Secondary | ICD-10-CM | POA: Diagnosis not present

## 2023-05-16 DIAGNOSIS — B9689 Other specified bacterial agents as the cause of diseases classified elsewhere: Secondary | ICD-10-CM | POA: Diagnosis not present

## 2023-05-16 DIAGNOSIS — T3695XA Adverse effect of unspecified systemic antibiotic, initial encounter: Secondary | ICD-10-CM | POA: Diagnosis not present

## 2023-05-16 MED ORDER — AMOXICILLIN-POT CLAVULANATE 875-125 MG PO TABS: 1.0000 | ORAL_TABLET | Freq: Two times a day (BID) | ORAL | 0 refills | Status: DC

## 2023-05-16 MED ORDER — FLUCONAZOLE 150 MG PO TABS: 150.0000 mg | ORAL_TABLET | ORAL | 0 refills | Status: AC | PRN

## 2023-05-16 NOTE — Progress Notes (Signed)

## 2023-05-19 ENCOUNTER — Ambulatory Visit: Payer: BC Managed Care – PPO | Admitting: Cardiology

## 2023-05-22 ENCOUNTER — Telehealth: Payer: Self-pay

## 2023-05-22 NOTE — Telephone Encounter (Signed)
 Patient called stating that she finished a weeks worth of Augmentin, she said she spiked a fever last night and she just has some congestion, could we call her in a zpak or something

## 2023-05-23 ENCOUNTER — Ambulatory Visit
Admission: RE | Admit: 2023-05-23 | Discharge: 2023-05-23 | Disposition: A | Discharge status: Home / Self Care | Source: Ambulatory Visit | Attending: Emergency Medicine | Admitting: Emergency Medicine

## 2023-05-23 VITALS — BP 136/87 | HR 106 | Temp 99.2°F | Resp 18

## 2023-05-23 DIAGNOSIS — J0100 Acute maxillary sinusitis, unspecified: Secondary | ICD-10-CM

## 2023-05-23 MED ORDER — DOXYCYCLINE HYCLATE 100 MG PO CAPS: 100.0000 mg | ORAL_CAPSULE | Freq: Two times a day (BID) | ORAL | 0 refills | Status: AC

## 2023-05-23 NOTE — ED Provider Notes (Signed)
 Renaldo Fiddler    CSN: [id number] Arrival date & time: 05/23/23  1418      History   Chief Complaint Chief Complaint  Patient presents with   Fever    Have had a cold for about 3 weeks. Took Augmentin. Finished that. Spiked a fever on Wednesday night, broke Thursday,  and back to low grade now - Entered by patient    HPI Stephanie [name] is a 43 y.o. female.  Patient presents with congestion, runny nose, postnasal drip, cough x 2 to 3 weeks.  She finished a 7-day course of Augmentin yesterday.  Her symptoms were improving but then she developed fever and increased congestion 2 days ago.  Tmax 101.  No OTC medications taken today.  She denies shortness of breath.  Patient had an e-visit on 05/16/2023; diagnosed with acute sinusitis and antibiotic induced yeast infection; treated with Augmentin and Diflucan.  The history is provided by the patient and medical records.    Past Medical History:  Diagnosis Date   Anxiety    history of   Anxiety disorder 04/12/2015   Headache(784.0)    SEVERE MIGRANES   Lupus    PONV (postoperative nausea and vomiting)     Patient Active Problem List   Diagnosis Date Noted   LUQ pain 01/21/2023   Other fatigue 07/18/2022   Lupus 07/18/2022   Mixed hyperlipidemia 07/18/2022   Apertognathia 10/22/2018   Breast lump 09/15/2018   Chronic venous insufficiency 04/27/2017   Swelling of limb 04/27/2017    Past Surgical History:  Procedure Laterality Date   KNEE ARTHROSCOPY      X 4 ON LEFT KNEE   KNEE ARTHROSCOPY W/ OATS PROCEDURE      X1 ON LEFT KNEE   MANDIBLE SURGERY     VAGINAL HYSTERECTOMY Bilateral 12/17/2016   Procedure: HYSTERECTOMY VAGINAL WITH BILATERAL SALPINGECTOMY;  Surgeon: Tereso Newcomer, MD;  Location: [facility];  Service: Gynecology;  Laterality: Bilateral;   WISDOM TOOTH EXTRACTION      ALL 4 REMOVE    OB History     Gravida  1   Para  1   Term  1   Preterm  0   AB  0   Living  1      SAB   0   IAB  0   Ectopic  0   Multiple  0   Live Births  1            Home Medications    Prior to Admission medications   Medication Sig Start Date End Date Taking? Authorizing Provider  doxycycline (VIBRAMYCIN) 100 MG capsule Take 1 capsule (100 mg total) by mouth 2 (two) times daily. 05/23/23  Yes Mickie Bail, NP  fluconazole (DIFLUCAN) 150 MG tablet Take 1 tablet (150 mg total) by mouth every 3 (three) days as needed. 05/16/23   Margaretann Loveless, PA-C  hydroxychloroquine (PLAQUENIL) 200 MG tablet Take 1 tablet twice daily Monday through Friday. Do not take on Saturdays and Sundays. 04/25/21   Pollyann Savoy, MD  Multiple Vitamin (MULTIVITAMIN) tablet Take 1 tablet by mouth daily.    [provider]  mycophenolate (CELLCEPT) 500 MG tablet Take 500 mg by mouth in the morning, at noon, and at bedtime.    [provider]    Family History Family History  Problem Relation Age of Onset   Anxiety disorder Mother    Cancer Father    Alcohol abuse Father  Kidney disease Father    Heart disease Father    Mental illness Sister    Heart disease Maternal Grandmother    Alcohol abuse Maternal Aunt    Mental illness Paternal Aunt    Breast cancer Neg Hx     Social History Social History   Tobacco Use   Smoking status: Never   Smokeless tobacco: Never  Vaping Use   Vaping status: Never Used  Substance Use Topics   Alcohol use: No   Drug use: No     Allergies   Sulfa antibiotics, Gluten meal, and Shellfish allergy   Review of Systems Review of Systems  Constitutional:  Positive for fever. Negative for chills.  HENT:  Positive for congestion, postnasal drip and rhinorrhea. Negative for ear pain and sore throat.   Respiratory:  Positive for cough. Negative for shortness of breath.   Gastrointestinal:  Negative for diarrhea and vomiting.     Physical Exam Triage Vital Signs ED Triage Vitals [05/23/23 1444]  Encounter Vitals Group     BP  136/87     Systolic BP Percentile      Diastolic BP Percentile      Pulse Rate (!) 106     Resp 18     Temp 99.2 F (37.3 C)     Temp src      SpO2 99 %     Weight      Height      Head Circumference      Peak Flow      Pain Score      Pain Loc      Pain Education      Exclude from Growth Chart    No data found.  Updated Vital Signs BP 136/87   Pulse (!) 106   Temp 99.2 F (37.3 C)   Resp 18   LMP 12/10/2016   SpO2 99%   Visual Acuity Right Eye Distance:   Left Eye Distance:   Bilateral Distance:    Right Eye Near:   Left Eye Near:    Bilateral Near:     Physical Exam Constitutional:      General: She is not in acute distress. HENT:     Right Ear: Tympanic membrane normal.     Left Ear: Tympanic membrane normal.     Nose: Congestion and rhinorrhea present.     Mouth/Throat:     Mouth: Mucous membranes are moist.     Pharynx: Oropharynx is clear.  Cardiovascular:     Rate and Rhythm: Normal rate and regular rhythm.     Heart sounds: Normal heart sounds.  Pulmonary:     Effort: Pulmonary effort is normal. No respiratory distress.     Breath sounds: Normal breath sounds.  Neurological:     Mental Status: She is alert.      UC Treatments / Results  Labs (all labs ordered are listed, but only abnormal results are displayed) Labs Reviewed - No data to display  EKG   Radiology No results found.  Procedures Procedures (including critical care time)  Medications Ordered in UC Medications - No data to display  Initial Impression / Assessment and Plan / UC Course  I have reviewed the triage vital signs and the nursing notes.  Pertinent labs & imaging results that were available during my care of the patient were reviewed by me and considered in my medical decision making (see chart for details).    Acute sinusitis.  Patient took 7  day course of Augmentin and was improving.  Her symptoms worsened 2 days ago.  Treating today with 10 day course of  doxycycline. Tylenol as needed.  Instructed her to follow-up with her PCP on Monday.  ED precautions given.  Education provided on sinus infection.  She agrees to plan of care.  Final Clinical Impressions(s) / UC Diagnoses   Final diagnoses:  Acute non-recurrent maxillary sinusitis     Discharge Instructions      Take the doxycycline as directed.  Follow up with your primary care provider on Monday.  Go to the emergency department if you have worsening symptoms.        ED Prescriptions     Medication Sig Dispense Auth. Provider   doxycycline (VIBRAMYCIN) 100 MG capsule Take 1 capsule (100 mg total) by mouth 2 (two) times daily. 20 capsule Mickie Bail, NP      PDMP not reviewed this encounter.   Mickie Bail, NP 05/23/23 [phone]

## 2023-05-23 NOTE — Discharge Instructions (Addendum)
 Take the doxycycline as directed.  Follow-up with your primary care provider on Monday.    Go to the emergency department if you have worsening symptoms.

## 2023-05-30 ENCOUNTER — Ambulatory Visit: Payer: BC Managed Care – PPO | Admitting: Cardiology

## 2023-06-25 DIAGNOSIS — J0120 Acute ethmoidal sinusitis, unspecified: Secondary | ICD-10-CM | POA: Diagnosis not present

## 2023-07-02 DIAGNOSIS — Z79899 Other long term (current) drug therapy: Secondary | ICD-10-CM | POA: Diagnosis not present

## 2023-07-02 DIAGNOSIS — M329 Systemic lupus erythematosus, unspecified: Secondary | ICD-10-CM | POA: Diagnosis not present

## 2023-07-21 DIAGNOSIS — Z79899 Other long term (current) drug therapy: Secondary | ICD-10-CM | POA: Diagnosis not present

## 2023-07-22 ENCOUNTER — Other Ambulatory Visit: Payer: Self-pay | Admitting: Cardiology

## 2023-07-22 DIAGNOSIS — Z1231 Encounter for screening mammogram for malignant neoplasm of breast: Secondary | ICD-10-CM

## 2023-08-12 ENCOUNTER — Ambulatory Visit
Admission: RE | Admit: 2023-08-12 | Discharge: 2023-08-12 | Disposition: A | Discharge status: Home / Self Care | Source: Ambulatory Visit | Attending: Cardiology | Admitting: Cardiology

## 2023-08-12 DIAGNOSIS — Z1231 Encounter for screening mammogram for malignant neoplasm of breast: Secondary | ICD-10-CM | POA: Insufficient documentation

## 2023-08-26 DIAGNOSIS — Z79899 Other long term (current) drug therapy: Secondary | ICD-10-CM | POA: Diagnosis not present

## 2023-08-26 DIAGNOSIS — M329 Systemic lupus erythematosus, unspecified: Secondary | ICD-10-CM | POA: Diagnosis not present

## 2023-09-15 DIAGNOSIS — Z79899 Other long term (current) drug therapy: Secondary | ICD-10-CM | POA: Diagnosis not present

## 2023-09-15 DIAGNOSIS — M329 Systemic lupus erythematosus, unspecified: Secondary | ICD-10-CM | POA: Diagnosis not present

## 2023-11-19 DIAGNOSIS — M329 Systemic lupus erythematosus, unspecified: Secondary | ICD-10-CM | POA: Diagnosis not present

## 2023-11-19 DIAGNOSIS — Z79899 Other long term (current) drug therapy: Secondary | ICD-10-CM | POA: Diagnosis not present

## 2023-11-25 DIAGNOSIS — M329 Systemic lupus erythematosus, unspecified: Secondary | ICD-10-CM | POA: Diagnosis not present

## 2023-11-25 DIAGNOSIS — Z79899 Other long term (current) drug therapy: Secondary | ICD-10-CM | POA: Diagnosis not present

## 2023-11-25 DIAGNOSIS — I7300 Raynaud's syndrome without gangrene: Secondary | ICD-10-CM | POA: Diagnosis not present

## 2024-01-02 DIAGNOSIS — Z1331 Encounter for screening for depression: Secondary | ICD-10-CM | POA: Diagnosis not present

## 2024-01-02 DIAGNOSIS — E782 Mixed hyperlipidemia: Secondary | ICD-10-CM | POA: Diagnosis not present

## 2024-01-02 DIAGNOSIS — Z0000 Encounter for general adult medical examination without abnormal findings: Secondary | ICD-10-CM | POA: Diagnosis not present

## 2024-01-02 DIAGNOSIS — Z131 Encounter for screening for diabetes mellitus: Secondary | ICD-10-CM | POA: Diagnosis not present

## 2024-01-02 DIAGNOSIS — Z1389 Encounter for screening for other disorder: Secondary | ICD-10-CM | POA: Diagnosis not present

## 2024-01-02 DIAGNOSIS — M329 Systemic lupus erythematosus, unspecified: Secondary | ICD-10-CM | POA: Diagnosis not present

## 2024-05-12 ENCOUNTER — Ambulatory Visit: Admitting: Orthopaedic Surgery
